# Patient Record
Sex: Female | Born: 1940 | Race: White | Hispanic: No | Marital: Married | Smoking: Never smoker
Health system: Southern US, Community
[De-identification: ages and names within clinical notes are randomized; demographics above are authoritative.]

## PROBLEM LIST (undated history)

## (undated) DIAGNOSIS — J449 Chronic obstructive pulmonary disease, unspecified: Secondary | ICD-10-CM

## (undated) DIAGNOSIS — E785 Hyperlipidemia, unspecified: Secondary | ICD-10-CM

## (undated) DIAGNOSIS — M1990 Unspecified osteoarthritis, unspecified site: Secondary | ICD-10-CM

## (undated) DIAGNOSIS — Z974 Presence of external hearing-aid: Secondary | ICD-10-CM

## (undated) DIAGNOSIS — J189 Pneumonia, unspecified organism: Secondary | ICD-10-CM

## (undated) DIAGNOSIS — J45909 Unspecified asthma, uncomplicated: Secondary | ICD-10-CM

## (undated) DIAGNOSIS — K219 Gastro-esophageal reflux disease without esophagitis: Secondary | ICD-10-CM

## (undated) DIAGNOSIS — R519 Headache, unspecified: Secondary | ICD-10-CM

## (undated) DIAGNOSIS — Z9289 Personal history of other medical treatment: Secondary | ICD-10-CM

## (undated) DIAGNOSIS — F0390 Unspecified dementia without behavioral disturbance: Secondary | ICD-10-CM

## (undated) DIAGNOSIS — R0600 Dyspnea, unspecified: Secondary | ICD-10-CM

## (undated) DIAGNOSIS — F909 Attention-deficit hyperactivity disorder, unspecified type: Secondary | ICD-10-CM

## (undated) DIAGNOSIS — K7460 Unspecified cirrhosis of liver: Secondary | ICD-10-CM

## (undated) HISTORY — PX: CHOLECYSTECTOMY: SHX55

## (undated) HISTORY — PX: COLONOSCOPY: SHX174

## (undated) HISTORY — PX: ABDOMINAL HYSTERECTOMY: SHX81

## (undated) HISTORY — PX: APPENDECTOMY: SHX54

---

## 2004-11-03 ENCOUNTER — Ambulatory Visit: Payer: Self-pay | Admitting: Internal Medicine

## 2005-11-06 ENCOUNTER — Ambulatory Visit: Payer: Self-pay | Admitting: Internal Medicine

## 2005-11-15 ENCOUNTER — Ambulatory Visit: Payer: Self-pay | Admitting: Internal Medicine

## 2006-10-04 ENCOUNTER — Ambulatory Visit: Payer: Self-pay | Admitting: Internal Medicine

## 2006-10-05 ENCOUNTER — Ambulatory Visit: Payer: Self-pay | Admitting: Internal Medicine

## 2006-10-31 ENCOUNTER — Ambulatory Visit: Payer: Self-pay | Admitting: Gynecologic Oncology

## 2006-11-19 ENCOUNTER — Ambulatory Visit: Payer: Self-pay | Admitting: Internal Medicine

## 2006-11-22 ENCOUNTER — Ambulatory Visit: Payer: Self-pay | Admitting: Unknown Physician Specialty

## 2007-02-25 ENCOUNTER — Emergency Department: Payer: Self-pay | Admitting: Emergency Medicine

## 2007-06-25 ENCOUNTER — Encounter: Payer: Self-pay | Admitting: Otolaryngology

## 2007-07-03 ENCOUNTER — Encounter: Payer: Self-pay | Admitting: Otolaryngology

## 2007-11-25 ENCOUNTER — Ambulatory Visit: Payer: Self-pay | Admitting: Family Medicine

## 2007-11-27 ENCOUNTER — Ambulatory Visit: Payer: Self-pay | Admitting: Family Medicine

## 2007-12-26 ENCOUNTER — Ambulatory Visit: Payer: Self-pay | Admitting: Gastroenterology

## 2008-04-15 ENCOUNTER — Ambulatory Visit: Payer: Self-pay | Admitting: Internal Medicine

## 2008-04-21 ENCOUNTER — Ambulatory Visit: Payer: Self-pay | Admitting: Internal Medicine

## 2008-11-26 ENCOUNTER — Ambulatory Visit: Payer: Self-pay | Admitting: Nurse Practitioner

## 2009-12-20 ENCOUNTER — Ambulatory Visit: Payer: Self-pay | Admitting: Nurse Practitioner

## 2010-02-09 ENCOUNTER — Ambulatory Visit: Payer: Self-pay | Admitting: Gastroenterology

## 2010-10-28 ENCOUNTER — Emergency Department: Payer: Self-pay | Admitting: Emergency Medicine

## 2011-01-05 ENCOUNTER — Ambulatory Visit: Payer: Self-pay | Admitting: Family Medicine

## 2012-04-03 DIAGNOSIS — E559 Vitamin D deficiency, unspecified: Secondary | ICD-10-CM | POA: Insufficient documentation

## 2012-04-26 ENCOUNTER — Ambulatory Visit: Payer: Self-pay | Admitting: Family Medicine

## 2013-02-03 DIAGNOSIS — G2581 Restless legs syndrome: Secondary | ICD-10-CM | POA: Insufficient documentation

## 2013-05-01 ENCOUNTER — Ambulatory Visit: Payer: Self-pay | Admitting: Family Medicine

## 2014-03-23 DIAGNOSIS — J479 Bronchiectasis, uncomplicated: Secondary | ICD-10-CM | POA: Insufficient documentation

## 2014-06-03 ENCOUNTER — Ambulatory Visit: Payer: Self-pay | Admitting: Family Medicine

## 2014-08-25 ENCOUNTER — Ambulatory Visit: Payer: Self-pay | Admitting: Family Medicine

## 2014-09-15 ENCOUNTER — Ambulatory Visit: Payer: Self-pay | Admitting: Urgent Care

## 2015-06-16 ENCOUNTER — Other Ambulatory Visit: Payer: Self-pay | Admitting: Family Medicine

## 2015-06-16 DIAGNOSIS — Z1231 Encounter for screening mammogram for malignant neoplasm of breast: Secondary | ICD-10-CM

## 2015-06-17 ENCOUNTER — Ambulatory Visit
Admission: RE | Admit: 2015-06-17 | Discharge: 2015-06-17 | Disposition: A | Discharge status: Home / Self Care | Payer: Medicare Other | Source: Ambulatory Visit | Attending: Family Medicine | Admitting: Family Medicine

## 2015-06-17 DIAGNOSIS — Z1231 Encounter for screening mammogram for malignant neoplasm of breast: Secondary | ICD-10-CM | POA: Diagnosis present

## 2015-08-16 ENCOUNTER — Emergency Department
Admission: EM | Admit: 2015-08-16 | Discharge: 2015-08-16 | Disposition: A | Discharge status: Home / Self Care | Payer: Medicare Other | Attending: Emergency Medicine | Admitting: Emergency Medicine

## 2015-08-16 ENCOUNTER — Encounter: Payer: Self-pay | Admitting: Emergency Medicine

## 2015-08-16 DIAGNOSIS — G2581 Restless legs syndrome: Secondary | ICD-10-CM | POA: Diagnosis not present

## 2015-08-16 DIAGNOSIS — M79604 Pain in right leg: Secondary | ICD-10-CM | POA: Diagnosis present

## 2015-08-16 DIAGNOSIS — Z880 Allergy status to penicillin: Secondary | ICD-10-CM | POA: Diagnosis not present

## 2015-08-16 DIAGNOSIS — Z79899 Other long term (current) drug therapy: Secondary | ICD-10-CM | POA: Insufficient documentation

## 2015-08-16 DIAGNOSIS — Z7951 Long term (current) use of inhaled steroids: Secondary | ICD-10-CM | POA: Diagnosis not present

## 2015-08-16 DIAGNOSIS — Z791 Long term (current) use of non-steroidal anti-inflammatories (NSAID): Secondary | ICD-10-CM | POA: Diagnosis not present

## 2015-08-16 HISTORY — DX: Hyperlipidemia, unspecified: E78.5

## 2015-08-16 HISTORY — DX: Chronic obstructive pulmonary disease, unspecified: J44.9

## 2015-08-16 HISTORY — DX: Gastro-esophageal reflux disease without esophagitis: K21.9

## 2015-08-16 MED ORDER — KETOROLAC TROMETHAMINE 60 MG/2ML IM SOLN
30.0000 mg | Freq: Once | INTRAMUSCULAR | Status: AC
  Administered 2015-08-16: 30 mg via INTRAMUSCULAR
  Filled 2015-08-16: qty 2

## 2015-08-16 MED ORDER — HYDROCODONE-ACETAMINOPHEN 5-325 MG PO TABS: 1.0000 | ORAL_TABLET | ORAL | Status: DC | PRN

## 2015-08-16 MED ORDER — OXYCODONE-ACETAMINOPHEN 5-325 MG PO TABS: 1.0000 | ORAL_TABLET | ORAL | Status: DC | PRN

## 2015-08-16 MED ORDER — BACLOFEN 10 MG PO TABS: 10.0000 mg | ORAL_TABLET | Freq: Three times a day (TID) | ORAL | Status: DC

## 2015-08-16 NOTE — Discharge Instructions (Signed)
Restless Legs Syndrome Restless legs syndrome is a condition that causes uncomfortable feelings or sensations in the legs, especially while sitting or lying down. The sensations usually cause an overwhelming urge to move the legs. The arms can also sometimes be affected. The condition can range from mild to severe. The symptoms often interfere with a person's ability to sleep. CAUSES The cause of this condition is not known. RISK FACTORS This condition is more likely to develop in:  People who are older than age 50.  Pregnant women. In general, restless legs syndrome is more common in women than in men.  People who have a family history of the condition.  People who have certain medical conditions, such as iron deficiency, kidney disease, Parkinson disease, or nerve damage.  People who take certain medicines, such as medicines for high blood pressure, nausea, colds, allergies, depression, and some heart conditions. SYMPTOMS The main symptom of this condition is uncomfortable sensations in the legs. These sensations may be:  Described as pulling, tingling, prickling, throbbing, crawling, or burning.  Worse while you are sitting or lying down.  Worse during periods of rest or inactivity.  Worse at night, often interfering with your sleep.  Accompanied by a very strong urge to move your legs.  Temporarily relieved by movement of your legs. The sensations usually affect both sides of the body. The arms can also be affected, but this is rare. People who have this condition often have tiredness during the day because of their lack of sleep at night. DIAGNOSIS This condition may be diagnosed based on your description of the symptoms. You may also have tests, including blood tests, to check for other conditions that may lead to your symptoms. In some cases, you may be asked to spend some time in a sleep lab so your sleeping can be monitored. TREATMENT Treatment for this condition is  focused on managing the symptoms. Treatment may include:  Self-help and lifestyle changes.  Medicines. HOME CARE INSTRUCTIONS  Take medicines only as directed by your health care provider.  Try these methods to get temporary relief from the uncomfortable sensations:  Massage your legs.  Walk or stretch.  Take a cold or hot bath.  Practice good sleep habits. For example, go to bed and get up at the same time every day.  Exercise regularly.  Practice ways of relaxing, such as yoga or meditation.  Avoid caffeine and alcohol.  Do not use any tobacco products, including cigarettes, chewing tobacco, or electronic cigarettes. If you need help quitting, ask your health care provider.  Keep all follow-up visits as directed by your health care provider. This is important. SEEK MEDICAL CARE IF: Your symptoms do not improve with treatment, or they get worse.   This information is not intended to replace advice given to you by your health care provider. Make sure you discuss any questions you have with your health care provider.   Document Released: 09/08/2002 Document Revised: 02/02/2015 Document Reviewed: 09/14/2014 Elsevier Interactive Patient Education 2016 Elsevier Inc.  

## 2015-08-16 NOTE — ED Provider Notes (Signed)
[facility] Emergency Department Provider Note  ____________________________________________  Time seen: Approximately 10:18 AM  I have reviewed the triage vital signs and the nursing notes.   HISTORY  Chief Complaint Leg Pain    HPI Abigail Powers is a 74 y.o. female is for evaluation of right leg pain 2 weeks. Patient states that she is under the care of Dr. for degenerative disc disease and has received a shot but still complain of pain going down her entire right leg. States the spasms at nighttime unable to sleep   Past Medical History  Diagnosis Date  . COPD (chronic obstructive pulmonary disease) ([place])   . GERD (gastroesophageal reflux disease)   . Hyperlipidemia     There are no active problems to display for this patient.   History reviewed. No pertinent past surgical history.  Current Outpatient Rx  Name  Route  Sig  Dispense  Refill  . citalopram (CELEXA) 10 MG tablet   Oral   Take 10 mg by mouth daily.         . diclofenac (VOLTAREN) 50 MG EC tablet   Oral   Take 50 mg by mouth 2 (two) times daily.         . fluticasone (FLONASE) 50 MCG/ACT nasal spray   Each Nare   Place 2 sprays into both nostrils daily.         Marland Kitchen lovastatin (MEVACOR) 10 MG tablet   Oral   Take 10 mg by mouth at bedtime.         . pantoprazole (PROTONIX) 40 MG tablet   Oral   Take 40 mg by mouth daily.         . traZODone (DESYREL) 50 MG tablet   Oral   Take 50 mg by mouth at bedtime.         . baclofen (LIORESAL) 10 MG tablet   Oral   Take 1 tablet (10 mg total) by mouth 3 (three) times daily.   60 tablet   0   . oxyCODONE-acetaminophen (ROXICET) 5-325 MG tablet   Oral   Take 1-2 tablets by mouth every 4 (four) hours as needed for severe pain.   15 tablet   0     Allergies Penicillins  History reviewed. No pertinent family history.  Social History Social History  Substance Use Topics  . Smoking status: Never  Smoker   . Smokeless tobacco: None  . Alcohol Use: No    Review of Systems Constitutional: No fever/chills Eyes: No visual changes. ENT: No sore throat. Cardiovascular: Denies chest pain. Respiratory: Denies shortness of breath. Gastrointestinal: No abdominal pain.  No nausea, no vomiting.  No diarrhea.  No constipation. Genitourinary: Negative for dysuria. Musculoskeletal: Negative for back pain. Positive for right leg pain Skin: Negative for rash. Neurological: Negative for headaches, focal weakness or numbness.  10-point ROS otherwise negative.  ____________________________________________   PHYSICAL EXAM:  VITAL SIGNS: ED Triage Vitals  Enc Vitals Group     BP 08/16/15 0947 134/69 mmHg     Pulse Rate 08/16/15 0947 71     Resp 08/16/15 0947 20     Temp 08/16/15 0947 98.8 F (37.1 C)     Temp Source 08/16/15 0947 Oral     SpO2 08/16/15 0947 95 %     Weight 08/16/15 0947 129 lb (58.514 kg)     Height 08/16/15 0947 5\' 4"  (1.626 m)     Head Cir --  Peak Flow --      Pain Score 08/16/15 0939 10     Pain Loc --      Pain Edu? --      Excl. in [place]? --     Constitutional: Alert and oriented. Well appearing and in no acute distress. Eyes: Conjunctivae are normal. PERRL. EOMI. Head: Atraumatic. Nose: No congestion/rhinnorhea. Mouth/Throat: Mucous membranes are moist.  Oropharynx non-erythematous. Neck: No stridor.  Cervical spinal tenderness to palpation. Cardiovascular: Normal rate, regular rhythm. Grossly normal heart sounds.  Good peripheral circulation. Respiratory: Normal respiratory effort.  No retractions. Lungs CTAB. Musculoskeletal: No lower extremity tenderness nor edema.  No joint effusions. Straight leg raise unremarkable, pelvic rock unremarkable. Distal neurovascularly intact. Neurologic:  Normal speech and language. No gross focal neurologic deficits are appreciated. No gait instability. Skin:  Skin is warm, dry and intact. No rash noted. Psychiatric:  Mood and affect are normal. Speech and behavior are normal.  ____________________________________________   LABS (all labs ordered are listed, but only abnormal results are displayed)  Labs Reviewed - No data to display ____________________________________________    PROCEDURES  Procedure(s) performed: None  Critical Care performed: No  ____________________________________________   INITIAL IMPRESSION / ASSESSMENT AND PLAN / ED COURSE  Pertinent labs & imaging results that were available during my care of the patient were reviewed by me and considered in my medical decision making (see chart for details).  Recurrent leg pain. Consider restless leg syndrome. Rx given for baclofen 10 mg 3 times a day, Percocet 5/325. Patient to continue her diclofenac sodium follow up with PCP next week as needed. She voices no other emergency medical complaints at this time. ____________________________________________   FINAL CLINICAL IMPRESSION(S) / ED DIAGNOSES  Final diagnoses:  Restless legs syndrome      Arlyss Repress, PA-C 08/16/15 1026  Lavonia Drafts, MD 08/16/15 1355

## 2015-08-16 NOTE — ED Notes (Addendum)
Pt to ed with c/o right leg pain x 2 weeks,  Pt states she was seen by PMD last week for same.  Pt reports she has DDD.  Denies injury.

## 2015-08-18 ENCOUNTER — Other Ambulatory Visit: Payer: Self-pay | Admitting: Rheumatology

## 2015-08-18 DIAGNOSIS — M545 Low back pain, unspecified: Secondary | ICD-10-CM

## 2015-08-18 DIAGNOSIS — M79605 Pain in left leg: Secondary | ICD-10-CM

## 2015-08-18 DIAGNOSIS — G8929 Other chronic pain: Secondary | ICD-10-CM

## 2015-08-18 DIAGNOSIS — M79604 Pain in right leg: Secondary | ICD-10-CM

## 2015-08-19 ENCOUNTER — Other Ambulatory Visit: Payer: Self-pay | Admitting: Rheumatology

## 2015-08-19 DIAGNOSIS — G8929 Other chronic pain: Secondary | ICD-10-CM

## 2015-08-19 DIAGNOSIS — M79604 Pain in right leg: Secondary | ICD-10-CM

## 2015-08-19 DIAGNOSIS — M545 Low back pain: Principal | ICD-10-CM

## 2015-08-19 DIAGNOSIS — M79605 Pain in left leg: Secondary | ICD-10-CM

## 2015-08-20 ENCOUNTER — Ambulatory Visit
Admission: RE | Admit: 2015-08-20 | Discharge: 2015-08-20 | Disposition: A | Discharge status: Home / Self Care | Payer: Medicare Other | Source: Ambulatory Visit | Attending: Rheumatology | Admitting: Rheumatology

## 2015-08-20 ENCOUNTER — Other Ambulatory Visit: Payer: Medicare Other

## 2015-08-20 DIAGNOSIS — G8929 Other chronic pain: Secondary | ICD-10-CM

## 2015-08-20 DIAGNOSIS — M79605 Pain in left leg: Secondary | ICD-10-CM

## 2015-08-20 DIAGNOSIS — M545 Low back pain: Principal | ICD-10-CM

## 2015-08-20 DIAGNOSIS — M79604 Pain in right leg: Secondary | ICD-10-CM

## 2015-08-25 ENCOUNTER — Other Ambulatory Visit: Payer: Self-pay | Admitting: Rheumatology

## 2015-08-25 DIAGNOSIS — M545 Low back pain: Principal | ICD-10-CM

## 2015-08-25 DIAGNOSIS — G8929 Other chronic pain: Secondary | ICD-10-CM

## 2015-08-30 ENCOUNTER — Ambulatory Visit
Admission: RE | Admit: 2015-08-30 | Discharge: 2015-08-30 | Disposition: A | Discharge status: Home / Self Care | Payer: Medicare Other | Source: Ambulatory Visit | Attending: Rheumatology | Admitting: Rheumatology

## 2015-08-30 ENCOUNTER — Other Ambulatory Visit: Payer: Self-pay | Admitting: Rheumatology

## 2015-08-30 DIAGNOSIS — M26609 Unspecified temporomandibular joint disorder, unspecified side: Secondary | ICD-10-CM | POA: Insufficient documentation

## 2015-08-30 DIAGNOSIS — E785 Hyperlipidemia, unspecified: Secondary | ICD-10-CM | POA: Insufficient documentation

## 2015-08-30 DIAGNOSIS — R109 Unspecified abdominal pain: Secondary | ICD-10-CM | POA: Insufficient documentation

## 2015-08-30 DIAGNOSIS — R739 Hyperglycemia, unspecified: Secondary | ICD-10-CM | POA: Insufficient documentation

## 2015-08-30 DIAGNOSIS — F32A Depression, unspecified: Secondary | ICD-10-CM | POA: Insufficient documentation

## 2015-08-30 DIAGNOSIS — G8929 Other chronic pain: Secondary | ICD-10-CM

## 2015-08-30 DIAGNOSIS — K219 Gastro-esophageal reflux disease without esophagitis: Secondary | ICD-10-CM | POA: Insufficient documentation

## 2015-08-30 DIAGNOSIS — G4700 Insomnia, unspecified: Secondary | ICD-10-CM | POA: Insufficient documentation

## 2015-08-30 DIAGNOSIS — M545 Low back pain: Principal | ICD-10-CM

## 2015-08-30 DIAGNOSIS — G43909 Migraine, unspecified, not intractable, without status migrainosus: Secondary | ICD-10-CM | POA: Insufficient documentation

## 2015-08-30 DIAGNOSIS — F419 Anxiety disorder, unspecified: Secondary | ICD-10-CM | POA: Insufficient documentation

## 2015-08-30 DIAGNOSIS — F329 Major depressive disorder, single episode, unspecified: Secondary | ICD-10-CM | POA: Insufficient documentation

## 2015-08-30 DIAGNOSIS — M810 Age-related osteoporosis without current pathological fracture: Secondary | ICD-10-CM | POA: Insufficient documentation

## 2015-08-30 DIAGNOSIS — M1990 Unspecified osteoarthritis, unspecified site: Secondary | ICD-10-CM | POA: Insufficient documentation

## 2015-08-30 DIAGNOSIS — J309 Allergic rhinitis, unspecified: Secondary | ICD-10-CM | POA: Insufficient documentation

## 2015-08-30 MED ORDER — IOHEXOL 180 MG/ML  SOLN
1.0000 mL | Freq: Once | INTRAMUSCULAR | Status: AC | PRN
Start: 2015-08-30 — End: 2015-08-30
  Administered 2015-08-30: 1 mL via EPIDURAL

## 2015-08-30 MED ORDER — METHYLPREDNISOLONE ACETATE 40 MG/ML INJ SUSP (RADIOLOG
120.0000 mg | Freq: Once | INTRAMUSCULAR | Status: AC
  Administered 2015-08-30: 120 mg via EPIDURAL

## 2015-08-30 NOTE — Discharge Instructions (Signed)

## 2015-09-06 ENCOUNTER — Ambulatory Visit: Payer: Medicare Other

## 2015-09-09 ENCOUNTER — Other Ambulatory Visit: Payer: Self-pay | Admitting: Rheumatology

## 2015-09-09 DIAGNOSIS — M545 Low back pain, unspecified: Secondary | ICD-10-CM

## 2015-09-09 DIAGNOSIS — G8929 Other chronic pain: Secondary | ICD-10-CM

## 2015-09-17 ENCOUNTER — Ambulatory Visit
Admission: RE | Admit: 2015-09-17 | Discharge: 2015-09-17 | Disposition: A | Discharge status: Home / Self Care | Payer: Medicare Other | Source: Ambulatory Visit | Attending: Rheumatology | Admitting: Rheumatology

## 2015-09-17 DIAGNOSIS — M545 Low back pain, unspecified: Secondary | ICD-10-CM

## 2015-09-17 DIAGNOSIS — G8929 Other chronic pain: Secondary | ICD-10-CM

## 2015-09-17 MED ORDER — IOHEXOL 180 MG/ML  SOLN
1.0000 mL | Freq: Once | INTRAMUSCULAR | Status: AC | PRN
  Administered 2015-09-17: 1 mL via EPIDURAL

## 2015-09-17 MED ORDER — METHYLPREDNISOLONE ACETATE 40 MG/ML INJ SUSP (RADIOLOG
120.0000 mg | Freq: Once | INTRAMUSCULAR | Status: AC
  Administered 2015-09-17: 120 mg via EPIDURAL

## 2015-12-06 DIAGNOSIS — J449 Chronic obstructive pulmonary disease, unspecified: Secondary | ICD-10-CM | POA: Insufficient documentation

## 2015-12-31 DIAGNOSIS — M67442 Ganglion, left hand: Secondary | ICD-10-CM | POA: Insufficient documentation

## 2016-02-14 ENCOUNTER — Other Ambulatory Visit: Payer: Self-pay | Admitting: Family Medicine

## 2016-02-14 DIAGNOSIS — R590 Localized enlarged lymph nodes: Secondary | ICD-10-CM

## 2016-02-17 ENCOUNTER — Ambulatory Visit: Payer: Medicare Other

## 2016-02-22 ENCOUNTER — Ambulatory Visit: Payer: Medicare Other

## 2016-02-22 ENCOUNTER — Ambulatory Visit
Admission: RE | Admit: 2016-02-22 | Discharge: 2016-02-22 | Disposition: A | Discharge status: Home / Self Care | Payer: Medicare Other | Source: Ambulatory Visit | Attending: Family Medicine | Admitting: Family Medicine

## 2016-02-22 DIAGNOSIS — R590 Localized enlarged lymph nodes: Secondary | ICD-10-CM

## 2016-02-22 DIAGNOSIS — R5383 Other fatigue: Secondary | ICD-10-CM | POA: Insufficient documentation

## 2016-02-22 DIAGNOSIS — R5381 Other malaise: Secondary | ICD-10-CM | POA: Insufficient documentation

## 2016-02-29 ENCOUNTER — Ambulatory Visit: Payer: Medicare Other

## 2016-06-13 ENCOUNTER — Other Ambulatory Visit: Payer: Self-pay | Admitting: Family Medicine

## 2016-06-13 DIAGNOSIS — R1084 Generalized abdominal pain: Secondary | ICD-10-CM

## 2016-06-15 ENCOUNTER — Other Ambulatory Visit: Payer: Self-pay | Admitting: Family Medicine

## 2016-06-15 ENCOUNTER — Ambulatory Visit
Admission: RE | Admit: 2016-06-15 | Discharge: 2016-06-15 | Disposition: A | Discharge status: Home / Self Care | Payer: Medicare Other | Source: Ambulatory Visit | Attending: Family Medicine | Admitting: Family Medicine

## 2016-06-15 ENCOUNTER — Ambulatory Visit: Admission: RE | Admit: 2016-06-15 | Payer: Medicare Other | Source: Ambulatory Visit

## 2016-06-15 DIAGNOSIS — I700 Atherosclerosis of aorta: Secondary | ICD-10-CM | POA: Diagnosis not present

## 2016-06-15 DIAGNOSIS — R1084 Generalized abdominal pain: Secondary | ICD-10-CM | POA: Diagnosis present

## 2016-06-15 DIAGNOSIS — R932 Abnormal findings on diagnostic imaging of liver and biliary tract: Secondary | ICD-10-CM | POA: Diagnosis not present

## 2016-06-15 DIAGNOSIS — Z1231 Encounter for screening mammogram for malignant neoplasm of breast: Secondary | ICD-10-CM

## 2016-06-15 DIAGNOSIS — Z9049 Acquired absence of other specified parts of digestive tract: Secondary | ICD-10-CM | POA: Insufficient documentation

## 2016-06-15 DIAGNOSIS — Z90710 Acquired absence of both cervix and uterus: Secondary | ICD-10-CM | POA: Insufficient documentation

## 2016-06-15 HISTORY — DX: Unspecified asthma, uncomplicated: J45.909

## 2016-06-15 MED ORDER — IOPAMIDOL (ISOVUE-300) INJECTION 61%
100.0000 mL | Freq: Once | INTRAVENOUS | Status: AC | PRN
  Administered 2016-06-15: 100 mL via INTRAVENOUS

## 2016-06-20 ENCOUNTER — Ambulatory Visit: Admission: RE | Admit: 2016-06-20 | Payer: Medicare Other | Source: Ambulatory Visit

## 2016-06-22 ENCOUNTER — Ambulatory Visit
Admission: RE | Admit: 2016-06-22 | Discharge: 2016-06-22 | Disposition: A | Discharge status: Home / Self Care | Payer: Medicare Other | Source: Ambulatory Visit | Attending: Family Medicine | Admitting: Family Medicine

## 2016-06-22 DIAGNOSIS — R928 Other abnormal and inconclusive findings on diagnostic imaging of breast: Secondary | ICD-10-CM | POA: Insufficient documentation

## 2016-06-22 DIAGNOSIS — Z1231 Encounter for screening mammogram for malignant neoplasm of breast: Secondary | ICD-10-CM | POA: Insufficient documentation

## 2016-06-27 ENCOUNTER — Other Ambulatory Visit: Payer: Self-pay | Admitting: Family Medicine

## 2016-06-27 DIAGNOSIS — N6489 Other specified disorders of breast: Secondary | ICD-10-CM

## 2016-07-07 ENCOUNTER — Ambulatory Visit: Payer: Medicare Other

## 2016-07-07 ENCOUNTER — Other Ambulatory Visit: Payer: Medicare Other

## 2016-07-11 ENCOUNTER — Ambulatory Visit
Admission: RE | Admit: 2016-07-11 | Discharge: 2016-07-11 | Disposition: A | Discharge status: Home / Self Care | Payer: Medicare Other | Source: Ambulatory Visit | Attending: Family Medicine | Admitting: Family Medicine

## 2016-07-11 DIAGNOSIS — N6489 Other specified disorders of breast: Secondary | ICD-10-CM

## 2016-07-11 DIAGNOSIS — N63 Unspecified lump in unspecified breast: Secondary | ICD-10-CM | POA: Diagnosis not present

## 2016-07-20 ENCOUNTER — Telehealth: Payer: Self-pay

## 2016-07-20 NOTE — Telephone Encounter (Signed)
Patient stated that she needed an upper endoscopy done and wants it with doctor wohl. Her doctor wanted her to get one because of something with her liver. I told her to give [place] a call back as soon as she finds out what why exactly it needs to be done. Triage is complete, she is perfectly fit to go to [facility] or [facility]

## 2016-07-20 NOTE — Telephone Encounter (Signed)
Gastroenterology Pre-Procedure Review  Request Date:  Requesting Physician:   PATIENT REVIEW QUESTIONS: The patient responded to the following health history questions as indicated:    1. Are you having any GI issues? yes (abdominal pain) 2. Do you have a personal history of Polyps? no 3. Do you have a family history of Colon Cancer or Polyps? no 4. Diabetes Mellitus? no 5. Joint replacements in the past 12 months?no 6. Major health problems in the past 3 months?no 7. Any artificial heart valves, MVP, or defibrillator?no    MEDICATIONS & ALLERGIES:    Patient reports the following regarding taking any anticoagulation/antiplatelet therapy:   Plavix, Coumadin, Eliquis, Xarelto, Lovenox, Pradaxa, Brilinta, or Effient? no Aspirin? no  Patient confirms/reports the following medications:  Current Outpatient Prescriptions  Medication Sig Dispense Refill  . Calcium Carb-Ergocalciferol 250-125 MG-UNIT TABS Take by mouth.    . citalopram (CELEXA) 10 MG tablet Take 10 mg by mouth daily.    . diclofenac (VOLTAREN) 50 MG EC tablet Take 50 mg by mouth 2 (two) times daily.    . fluticasone (FLONASE) 50 MCG/ACT nasal spray Place 2 sprays into both nostrils daily.    Marland Kitchen HYDROcodone-acetaminophen (NORCO) 5-325 MG tablet Take 1-2 tablets by mouth every 4 (four) hours as needed for moderate pain. 15 tablet 0  . lovastatin (MEVACOR) 10 MG tablet Take 10 mg by mouth at bedtime.    . pantoprazole (PROTONIX) 40 MG tablet Take 40 mg by mouth daily.    . polyethylene glycol powder (MIRALAX) powder Take 1 Container by mouth once.    . SYMBICORT 160-4.5 MCG/ACT inhaler     . traZODone (DESYREL) 50 MG tablet Take 50 mg by mouth at bedtime.    . vitamin B-12 (CYANOCOBALAMIN) 1000 MCG tablet Take by mouth.    Marland Kitchen albuterol (PROAIR HFA) 108 (90 BASE) MCG/ACT inhaler Inhale into the lungs.    . clonazePAM (KLONOPIN) 0.5 MG tablet Take by mouth.    Marland Kitchen ipratropium-albuterol (DUONEB) 0.5-2.5 (3) MG/3ML SOLN Inhale into  the lungs.     No current facility-administered medications for this visit.     Patient confirms/reports the following allergies:  Allergies  Allergen Reactions  . Penicillins Rash    No orders of the defined types were placed in this encounter.   AUTHORIZATION INFORMATION Primary Insurance: 1D#: Group #:  Secondary Insurance: 1D#: Group #:  SCHEDULE INFORMATION: Date:  Time: Location:

## 2016-08-23 ENCOUNTER — Encounter: Payer: Self-pay | Admitting: Gastroenterology

## 2016-08-23 ENCOUNTER — Ambulatory Visit (INDEPENDENT_AMBULATORY_CARE_PROVIDER_SITE_OTHER): Payer: Medicare Other | Admitting: Gastroenterology

## 2016-08-23 ENCOUNTER — Other Ambulatory Visit: Payer: Self-pay

## 2016-08-23 ENCOUNTER — Other Ambulatory Visit
Admission: RE | Admit: 2016-08-23 | Discharge: 2016-08-23 | Disposition: A | Discharge status: Home / Self Care | Payer: Medicare Other | Source: Ambulatory Visit | Attending: Gastroenterology | Admitting: Gastroenterology

## 2016-08-23 VITALS — BP 130/53 | HR 80 | Temp 98.4°F | Ht 64.0 in | Wt 119.0 lb

## 2016-08-23 DIAGNOSIS — R935 Abnormal findings on diagnostic imaging of other abdominal regions, including retroperitoneum: Secondary | ICD-10-CM

## 2016-08-23 DIAGNOSIS — R634 Abnormal weight loss: Secondary | ICD-10-CM | POA: Diagnosis not present

## 2016-08-23 DIAGNOSIS — K5900 Constipation, unspecified: Secondary | ICD-10-CM

## 2016-08-23 DIAGNOSIS — R1010 Upper abdominal pain, unspecified: Secondary | ICD-10-CM

## 2016-08-23 LAB — CBC WITH DIFFERENTIAL/PLATELET
BASOS ABS: 0 10*3/uL (ref 0–0.1)
Basophils Relative: 1 %
Eosinophils Absolute: 0.1 10*3/uL (ref 0–0.7)
Eosinophils Relative: 2 %
HEMATOCRIT: 37.1 % (ref 35.0–47.0)
Hemoglobin: 13.1 g/dL (ref 12.0–16.0)
LYMPHS ABS: 1.2 10*3/uL (ref 1.0–3.6)
LYMPHS PCT: 23 %
MCH: 33.1 pg (ref 26.0–34.0)
MCHC: 35.2 g/dL (ref 32.0–36.0)
MCV: 94 fL (ref 80.0–100.0)
MONO ABS: 0.5 10*3/uL (ref 0.2–0.9)
MONOS PCT: 8 %
NEUTROS ABS: 3.6 10*3/uL (ref 1.4–6.5)
Neutrophils Relative %: 66 %
Platelets: 253 10*3/uL (ref 150–440)
RBC: 3.95 MIL/uL (ref 3.80–5.20)
RDW: 14 % (ref 11.5–14.5)
WBC: 5.4 10*3/uL (ref 3.6–11.0)

## 2016-08-23 LAB — IRON AND TIBC
IRON: 85 ug/dL (ref 28–170)
Saturation Ratios: 26 % (ref 10.4–31.8)
TIBC: 331 ug/dL (ref 250–450)
UIBC: 246 ug/dL

## 2016-08-23 LAB — FERRITIN: FERRITIN: 26 ng/mL (ref 11–307)

## 2016-08-23 NOTE — Patient Instructions (Signed)
High-Fiber Diet Fiber, also called dietary fiber, is a type of carbohydrate found in fruits, vegetables, whole grains, and beans. A high-fiber diet can have many health benefits. Your health care provider may recommend a high-fiber diet to help:  Prevent constipation. Fiber can make your bowel movements more regular.  Lower your cholesterol.  Relieve hemorrhoids, uncomplicated diverticulosis, or irritable bowel syndrome.  Prevent overeating as part of a weight-loss plan.  Prevent heart disease, type 2 diabetes, and certain cancers. What is my plan? The recommended daily intake of fiber includes:  38 grams for men under age 66.  42 grams for men over age 84.  36 grams for women under age 46.  15 grams for women over age 15. You can get the recommended daily intake of dietary fiber by eating a variety of fruits, vegetables, grains, and beans. Your health care provider may also recommend a fiber supplement if it is not possible to get enough fiber through your diet. What do I need to know about a high-fiber diet?  Fiber supplements have not been widely studied for their effectiveness, so it is better to get fiber through food sources.  Always check the fiber content on thenutrition facts label of any prepackaged food. Look for foods that contain at least 5 grams of fiber per serving.  Ask your dietitian if you have questions about specific foods that are related to your condition, especially if those foods are not listed in the following section.  Increase your daily fiber consumption gradually. Increasing your intake of dietary fiber too quickly may cause bloating, cramping, or gas.  Drink plenty of water. Water helps you to digest fiber. What foods can I eat? Grains  Whole-grain breads. Multigrain cereal. Oats and oatmeal. Brown [place]. Barley. Bulgur wheat. [place]. Bran muffins. Popcorn. Rye wafer crackers. Vegetables  Sweet potatoes. Spinach. Kale. Artichokes. Cabbage. Broccoli.  Green peas. Carrots. Squash. Fruits  Berries. Pears. Apples. Oranges. Avocados. Prunes and raisins. Dried figs. Meats and Other Protein Sources  [place], kidney, pinto, and soy beans. Split peas. Lentils. Nuts and seeds. Dairy  Fiber-fortified yogurt. Beverages  Fiber-fortified soy milk. Fiber-fortified orange juice. Other  Fiber bars. The items listed above may not be a complete list of recommended foods or beverages. Contact your dietitian for more options.  What foods are not recommended? Grains  White bread. Pasta made with refined flour. White [place]. Vegetables  Fried potatoes. Canned vegetables. Well-cooked vegetables. Fruits  Fruit juice. Cooked, strained fruit. Meats and Other Protein Sources  Fatty cuts of meat. Fried Sales executive or fried fish. Dairy  Milk. Yogurt. Cream cheese. Sour cream. Beverages  Soft drinks. Other  Cakes and pastries. Butter and oils. The items listed above may not be a complete list of foods and beverages to avoid. Contact your dietitian for more information.  What are some tips for including high-fiber foods in my diet?  Eat a wide variety of high-fiber foods.  Make sure that half of all grains consumed each day are whole grains.  Replace breads and cereals made from refined flour or white flour with whole-grain breads and cereals.  Replace white [place] with brown [place], bulgur wheat, or millet.  Start the day with a breakfast that is high in fiber, such as a cereal that contains at least 5 grams of fiber per serving.  Use beans in place of meat in soups, salads, or pasta.  Eat high-fiber snacks, such as berries, raw vegetables, nuts, or popcorn. This information is not intended to replace  questions you have with your health care provider. Document Released: 09/18/2005 Document Revised: 02/24/2016 Document Reviewed: 03/03/2014 Elsevier Interactive Patient Education  2017  Elsevier Inc. Constipation, Adult Constipation is when a person has fewer bowel movements in a week than normal, has difficulty having a bowel movement, or has stools that are dry, hard, or larger than normal. Constipation may be caused by an underlying condition. It may become worse with age if a person takes certain medicines and does not take in enough fluids. Follow these instructions at home: Eating and drinking   Eat foods that have a lot of fiber, such as fresh fruits and vegetables, whole grains, and beans.  Limit foods that are high in fat, low in fiber, or overly processed, such as french fries, hamburgers, cookies, candies, and soda.  Drink enough fluid to keep your urine clear or pale yellow. General instructions  Exercise regularly or as told by your health care provider.  Go to the restroom when you have the urge to go. Do not hold it in.  Take over-the-counter and prescription medicines only as told by your health care provider. These include any fiber supplements.  Practice pelvic floor retraining exercises, such as deep breathing while relaxing the lower abdomen and pelvic floor relaxation during bowel movements.  Watch your condition for any changes.  Keep all follow-up visits as told by your health care provider. This is important. Contact a health care provider if:  You have pain that gets worse.  You have a fever.  You do not have a bowel movement after 4 days.  You vomit.  You are not hungry.  You lose weight.  You are bleeding from the anus.  You have thin, pencil-like stools. Get help right away if:  You have a fever and your symptoms suddenly get worse.  You leak stool or have blood in your stool.  Your abdomen is bloated.  You have severe pain in your abdomen.  You feel dizzy or you faint. This information is not intended to replace advice given to you by your health care provider. Make sure you discuss any questions you have with your  health care provider. Document Released: 06/16/2004 Document Revised: 04/07/2016 Document Reviewed: 03/08/2016 Elsevier Interactive Patient Education  2017 Elsevier Inc.  

## 2016-08-23 NOTE — Progress Notes (Signed)
Gastroenterology Consultation  Referring Provider:     Gayland Curry, MD Primary Care Physician:  Gayland Curry, MD Primary Gastroenterologist:  Dr. Jonathon Bellows  Reason for Consultation:     Abnormal CT scan of the abdomen         HPI:   Abigail Powers is a 75 y.o. y/o female referred for consultation & management  by Dr. Gayland Curry, MD.   She has been referred here for an abnormal CT scan of the abdomen which was performed on 06/15/2016 for abdominal pain which revealed changes of the liver suggestive of cirrhosis and moderate stool burden in the colon.   A CT scan in 08/2014 did not reveal any abnormalities of her liver.   LFT's in 01/2016, 11/2014 were normal particularly with albumin.   I do not have any PT/INR or a platelet count available.   She says that she had the CT for the abdominal pain.   Abdominal pain: Onset: ongoing for years, since the 1960's.  She used to be on Chlordiazepoxide which she says worked. She said that in the past she was anxious. Occur once a week , each episode lasted for a few hours Site :points to the upper part of her abdomen  Radiation: no  Nature of pain: squeezing Aggravating factors: working and at times when she eats Relieving factors :resting Weight loss: Last 6 months says she has lost 10 lbs .  NSAID use: at times uses hydrocodone for shoulders and arms  PPI use :protonix for some years , does not help with the pain.  Gall bladder surgery: removed Frequency of bowel movements:  Used to have one every 2-3 days and had to strain very hard. Started using magnesium citrate , once daily , has a good bowel movement with , uses miralax as well daily. No change in abdominal pain  Relief with bowel movements: she does feel better after one  Gas/Bloating/Abdominal distension: some not much  .  Denies any alcohol intake, no tatoos either, no liver disease in the family. Last colonoscopy was 6 years back , she cant recall if she  had any polyps.      Past Medical History:  Diagnosis Date  . Asthma   . COPD (chronic obstructive pulmonary disease) ([place])   . GERD (gastroesophageal reflux disease)   . Hyperlipidemia     Past Surgical History:  Procedure Laterality Date  . ABDOMINAL HYSTERECTOMY      Prior to Admission medications   Medication Sig Start Date End Date Taking? Authorizing Provider  Calcium Carb-Ergocalciferol 250-125 MG-UNIT TABS Take by mouth.    Historical Provider, MD  citalopram (CELEXA) 10 MG tablet Take 10 mg by mouth daily.    Historical Provider, MD  clonazePAM (KLONOPIN) 0.5 MG tablet Take by mouth. 05/06/13   Historical Provider, MD  diclofenac (VOLTAREN) 50 MG EC tablet Take 50 mg by mouth 2 (two) times daily.    Historical Provider, MD  fluticasone (FLONASE) 50 MCG/ACT nasal spray Place 2 sprays into both nostrils daily.    Historical Provider, MD  HYDROcodone-acetaminophen (NORCO) 5-325 MG tablet Take 1-2 tablets by mouth every 4 (four) hours as needed for moderate pain. 08/16/15   Pierce Crane Beers, PA-C  ipratropium-albuterol (DUONEB) 0.5-2.5 (3) MG/3ML SOLN Inhale into the lungs. 12/16/14 12/11/15  Historical Provider, MD  lovastatin (MEVACOR) 10 MG tablet Take 10 mg by mouth at bedtime.    Historical Provider, MD  pantoprazole (PROTONIX) 40 MG tablet Take 40 mg by mouth daily.  Historical Provider, MD  polyethylene glycol powder (MIRALAX) powder Take 1 Container by mouth once.    Historical Provider, MD  SYMBICORT 160-4.5 MCG/ACT inhaler  07/13/15   Historical Provider, MD  traZODone (DESYREL) 50 MG tablet Take 50 mg by mouth at bedtime.    Historical Provider, MD  vitamin B-12 (CYANOCOBALAMIN) 1000 MCG tablet Take by mouth.    Historical Provider, MD    Family History  Problem Relation Age of Onset  . Breast cancer Neg Hx      Social History  Substance Use Topics  . Smoking status: Never Smoker  . Smokeless tobacco: Never Used  . Alcohol use No    Allergies as of  08/23/2016 - Review Complete 08/23/2016  Allergen Reaction Noted  . Penicillins Rash 08/16/2015    Review of Systems:    All systems reviewed and negative except where noted in HPI.   Physical Exam:  BP (!) 130/53   Pulse 80   Temp 98.4 F (36.9 C) (Oral)   Ht 5\' 4"  (1.626 m)   Wt 119 lb (54 kg)   BMI 20.43 kg/m  No LMP recorded. Patient has had a hysterectomy. Psych:  Alert and cooperative. Normal mood and affect. General:   Alert,  Well-developed, well-nourished, pleasant and cooperative in NAD Head:  Normocephalic and atraumatic. Eyes:  Sclera clear, no icterus.   Conjunctiva pink. Ears:  Normal auditory acuity. Mouth:  No deformity or lesions,oropharynx pink & moist. Neck:  Supple; no masses or thyromegaly. Lungs:  Respirations even and unlabored.  Clear throughout to auscultation.   No wheezes, crackles, or rhonchi. No acute distress. Heart:  Regular rate and rhythm; no murmurs, clicks, rubs, or gallops. Abdomen:  Normal bowel sounds.  No bruits.  Soft, non-tender and non-distended without masses, hepatosplenomegaly or hernias noted.  No guarding or rebound tenderness.    Msk:  Symmetrical without gross deformities. Good, equal movement & strength bilaterally. Pulses:  Normal pulses noted. Extremities:  No clubbing or edema.  No cyanosis. Neurologic:  Alert and oriented x3;  grossly normal neurologically.Marland Kitchen Psych:  Alert and cooperative. Normal mood and affect.  Imaging Studies: No results found.  Assessment and Plan:   Abigail Powers is a 75 y.o. y/o female has been referred for an abnormal CT which was performed for evaluation of weight loss and abdominal pain ongoing for many years. The CT scan demonstrated features of liver cirrhosis, she has no prior history of liver disease and had a normal Ct scan of the abdomen back in 2015. She does not abuse alcohol. She was also found to have moderate constipation and it is very likely the cause of her abdominal pain.    Plan  1. Liver cirrhosis: will obtain platelet count, INR, autoimmune and viral hepatitis panel. If negative will monitor LFT's Q 6 monthly.  2. Constipation : suggest high fiber diet as her diet is poor in fiber, she is also using hydrocodone which would cause constipation , continue magnesium citrate as needed and miralax daily if needed we can change it to linzess in the future. 3. Unintentional weight loss - will plan for EGD+colonoscopy.  4. Chronic abdominal pain : likely secondary to constipation - will check stool for H pylori too.    I have discussed alternative options, risks & benefits,  which include, but are not limited to, bleeding, infection, perforation,respiratory complication & drug reaction.  The patient agrees with this plan & written consent will be obtained.    Follow up  in 6 weeks   Dr Jonathon Bellows MD

## 2016-08-24 LAB — CERULOPLASMIN: CERULOPLASMIN: 24.8 mg/dL (ref 19.0–39.0)

## 2016-08-24 LAB — ALPHA-1-ANTITRYPSIN: A1 ANTITRYPSIN SER: 115 mg/dL (ref 90–200)

## 2016-08-24 LAB — HEPATITIS B SURFACE ANTIBODY, QUANTITATIVE

## 2016-08-24 LAB — HEPATITIS C ANTIBODY

## 2016-08-25 ENCOUNTER — Other Ambulatory Visit
Admission: RE | Admit: 2016-08-25 | Discharge: 2016-08-25 | Disposition: A | Discharge status: Home / Self Care | Payer: Medicare Other | Source: Ambulatory Visit | Attending: Gastroenterology | Admitting: Gastroenterology

## 2016-08-25 DIAGNOSIS — R1010 Upper abdominal pain, unspecified: Secondary | ICD-10-CM | POA: Diagnosis present

## 2016-08-27 LAB — CELIAC DISEASE PANEL
Endomysial Ab, IgA: NEGATIVE
IGA: 91 mg/dL (ref 64–422)

## 2016-08-27 LAB — ANTI-SMOOTH MUSCLE ANTIBODY, IGG: F-Actin IgG: 6 Units (ref 0–19)

## 2016-08-27 LAB — MITOCHONDRIAL ANTIBODIES: Mitochondrial M2 Ab, IgG: 8.7 Units (ref 0.0–20.0)

## 2016-08-27 LAB — H. PYLORI ANTIGEN, STOOL: H. Pylori Stool Ag, Eia: NEGATIVE

## 2016-08-30 ENCOUNTER — Encounter: Payer: Self-pay | Admitting: *Deleted

## 2016-08-31 ENCOUNTER — Encounter
Admission: RE | Disposition: A | Discharge status: Home / Self Care | Payer: Self-pay | Source: Ambulatory Visit | Attending: Gastroenterology

## 2016-08-31 ENCOUNTER — Ambulatory Visit: Payer: Medicare Other | Admitting: Anesthesiology

## 2016-08-31 ENCOUNTER — Encounter: Payer: Self-pay | Admitting: *Deleted

## 2016-08-31 ENCOUNTER — Ambulatory Visit
Admission: RE | Admit: 2016-08-31 | Discharge: 2016-08-31 | Disposition: A | Discharge status: Home / Self Care | Payer: Medicare Other | Source: Ambulatory Visit | Attending: Gastroenterology | Admitting: Gastroenterology

## 2016-08-31 DIAGNOSIS — Z6820 Body mass index (BMI) 20.0-20.9, adult: Secondary | ICD-10-CM | POA: Diagnosis not present

## 2016-08-31 DIAGNOSIS — E785 Hyperlipidemia, unspecified: Secondary | ICD-10-CM | POA: Insufficient documentation

## 2016-08-31 DIAGNOSIS — K219 Gastro-esophageal reflux disease without esophagitis: Secondary | ICD-10-CM | POA: Insufficient documentation

## 2016-08-31 DIAGNOSIS — K319 Disease of stomach and duodenum, unspecified: Secondary | ICD-10-CM | POA: Diagnosis not present

## 2016-08-31 DIAGNOSIS — J449 Chronic obstructive pulmonary disease, unspecified: Secondary | ICD-10-CM | POA: Insufficient documentation

## 2016-08-31 DIAGNOSIS — Z79899 Other long term (current) drug therapy: Secondary | ICD-10-CM | POA: Insufficient documentation

## 2016-08-31 DIAGNOSIS — R1013 Epigastric pain: Secondary | ICD-10-CM

## 2016-08-31 DIAGNOSIS — R634 Abnormal weight loss: Secondary | ICD-10-CM

## 2016-08-31 DIAGNOSIS — Z7951 Long term (current) use of inhaled steroids: Secondary | ICD-10-CM | POA: Insufficient documentation

## 2016-08-31 DIAGNOSIS — Z9049 Acquired absence of other specified parts of digestive tract: Secondary | ICD-10-CM | POA: Insufficient documentation

## 2016-08-31 HISTORY — PX: ESOPHAGOGASTRODUODENOSCOPY (EGD) WITH PROPOFOL: SHX5813

## 2016-08-31 SURGERY — ESOPHAGOGASTRODUODENOSCOPY (EGD) WITH PROPOFOL
Anesthesia: General

## 2016-08-31 MED ORDER — LIDOCAINE HCL (CARDIAC) 20 MG/ML IV SOLN
INTRAVENOUS | Status: DC | PRN
  Administered 2016-08-31: 60 mg via INTRAVENOUS

## 2016-08-31 MED ORDER — SODIUM CHLORIDE 0.9 % IV SOLN
INTRAVENOUS | Status: DC
  Administered 2016-08-31: 08:00:00 via INTRAVENOUS
  Administered 2016-08-31: 1000 mL via INTRAVENOUS

## 2016-08-31 MED ORDER — MIDAZOLAM HCL 2 MG/2ML IJ SOLN
INTRAMUSCULAR | Status: DC | PRN
  Administered 2016-08-31: 1 mg via INTRAVENOUS

## 2016-08-31 MED ORDER — GLYCOPYRROLATE 0.2 MG/ML IJ SOLN
INTRAMUSCULAR | Status: DC | PRN
  Administered 2016-08-31: 0.2 mg via INTRAVENOUS

## 2016-08-31 MED ORDER — PROPOFOL 10 MG/ML IV BOLUS
INTRAVENOUS | Status: DC | PRN
  Administered 2016-08-31: 50 mg via INTRAVENOUS

## 2016-08-31 NOTE — H&P (Signed)
Jonathon Bellows MD [address] [place],  [address] Phone: [phone] Fax : [phone]  Primary Care Physician:  Gayland Curry, MD Primary Gastroenterologist:  Dr. Jonathon Bellows   Pre-Procedure History & Physical: HPI:  Abigail Powers is a 75 y.o. female is here for an endoscopy.   Past Medical History:  Diagnosis Date  . Asthma   . COPD (chronic obstructive pulmonary disease) ([employer])   . GERD (gastroesophageal reflux disease)   . Hyperlipidemia     Past Surgical History:  Procedure Laterality Date  . ABDOMINAL HYSTERECTOMY    . APPENDECTOMY    . CHOLECYSTECTOMY      Prior to Admission medications   Medication Sig Start Date End Date Taking? Authorizing Provider  Calcium Carb-Ergocalciferol 250-125 MG-UNIT TABS Take by mouth.   Yes Historical Provider, MD  citalopram (CELEXA) 10 MG tablet Take 10 mg by mouth daily.   Yes Historical Provider, MD  clonazePAM (KLONOPIN) 0.5 MG tablet Take by mouth. 05/06/13  Yes Historical Provider, MD  diclofenac (VOLTAREN) 50 MG EC tablet Take 50 mg by mouth 2 (two) times daily.   Yes Historical Provider, MD  dicyclomine (BENTYL) 20 MG tablet Take 20 mg by mouth 3 (three) times daily before meals.   Yes Historical Provider, MD  docusate sodium (COLACE) 100 MG capsule Take 100 mg by mouth 2 (two) times daily.   Yes Historical Provider, MD  fluticasone (FLONASE) 50 MCG/ACT nasal spray Place 2 sprays into both nostrils daily.   Yes Historical Provider, MD  HYDROcodone-acetaminophen (NORCO) 5-325 MG tablet Take 1-2 tablets by mouth every 4 (four) hours as needed for moderate pain. 08/16/15  Yes Pierce Crane Beers, PA-C  lovastatin (MEVACOR) 10 MG tablet Take 10 mg by mouth at bedtime.   Yes Historical Provider, MD  Magnesium 250 MG TABS Take 1 tablet by mouth daily.   Yes Historical Provider, MD  multivitamin-iron-minerals-folic acid (CENTRUM) chewable tablet Chew 1 tablet by mouth daily.   Yes Historical Provider, MD  Omega-3  Fatty Acids (FISH OIL) 1000 MG CAPS Take 1 capsule by mouth daily.   Yes Historical Provider, MD  pantoprazole (PROTONIX) 40 MG tablet Take 40 mg by mouth daily.   Yes Historical Provider, MD  rOPINIRole (REQUIP) 0.5 MG tablet Take 0.5 mg by mouth at bedtime.   Yes Historical Provider, MD  simethicone (MYLICON) 0000000 MG chewable tablet Chew 375 mg by mouth every 6 (six) hours as needed for flatulence.   Yes Historical Provider, MD  SUMAtriptan (IMITREX) 50 MG tablet Take 50 mg by mouth every 2 (two) hours as needed for migraine. May repeat in 2 hours if headache persists or recurs.   Yes Historical Provider, MD  [facility] 160-4.5 MCG/ACT inhaler  07/13/15  Yes Historical Provider, MD  traZODone (DESYREL) 50 MG tablet Take 50 mg by mouth at bedtime.   Yes Historical Provider, MD  vitamin B-12 (CYANOCOBALAMIN) 1000 MCG tablet Take by mouth.   Yes Historical Provider, MD  polyethylene glycol powder (MIRALAX) powder Take 1 Container by mouth once.    Historical Provider, MD    Allergies as of 08/23/2016 - Review Complete 08/23/2016  Allergen Reaction Noted  . Penicillins Rash 08/16/2015    Family History  Problem Relation Age of Onset  . Heart disease Father   . Breast cancer Neg Hx     Social History   Social History  . Marital status: Married    Spouse name: N/A  . Number of children: N/A  . Years  of education: N/A   Occupational History  . Not on file.   Social History Main Topics  . Smoking status: Never Smoker  . Smokeless tobacco: Never Used  . Alcohol use No  . Drug use: No  . Sexual activity: Not on file   Other Topics Concern  . Not on file   Social History Narrative  . No narrative on file    Review of Systems: See HPI, otherwise negative ROS  Physical Exam: BP (!) 124/47   Pulse 68   Temp 97.6 F (36.4 C) (Oral)   Resp 16   Ht 5' 4.25" (1.632 m)   Wt 119 lb (54 kg)   SpO2 96%   BMI 20.27 kg/m  General:   Alert,  pleasant and cooperative in NAD Head:   Normocephalic and atraumatic. Neck:  Supple; no masses or thyromegaly. Lungs:  Clear throughout to auscultation.    Heart:  Regular rate and rhythm. Abdomen:  Soft, nontender and nondistended. Normal bowel sounds, without guarding, and without rebound.   Neurologic:  Alert and  oriented x4;  grossly normal neurologically.  Impression/Plan: Abigail Powers is here for an endoscopy to be performed for abdominal pain  Risks, benefits, limitations, and alternatives regarding  endoscopy have been reviewed with the patient.  Questions have been answered.  All parties agreeable.   Jonathon Bellows, MD  08/31/2016, 8:04 AM

## 2016-08-31 NOTE — Transfer of Care (Signed)
Immediate Anesthesia Transfer of Care Note  Patient: Abigail Powers  Procedure(s) Performed: Procedure(s): ESOPHAGOGASTRODUODENOSCOPY (EGD) WITH PROPOFOL (N/A)  Patient Location: Endoscopy Unit  Anesthesia Type:General  Level of Consciousness: awake, alert , oriented and patient cooperative  Airway & Oxygen Therapy: Patient Spontanous Breathing and Patient connected to nasal cannula oxygen  Post-op Assessment: Report given to RN, Post -op Vital signs reviewed and stable and Patient moving all extremities X 4  Post vital signs: Reviewed and stable  Last Vitals:  Vitals:   08/31/16 0733  BP: (!) 124/47  Pulse: 68  Resp: 16  Temp: 36.4 C    Last Pain:  Vitals:   08/31/16 0733  TempSrc: Oral         Complications: No apparent anesthesia complications

## 2016-08-31 NOTE — Anesthesia Preprocedure Evaluation (Signed)
Anesthesia Evaluation  Patient identified by MRN, date of birth, ID band Patient awake    Reviewed: Allergy & Precautions, H&P , NPO status , Patient's Chart, lab work & pertinent test results, reviewed documented beta blocker date and time   Airway Mallampati: II   Neck ROM: full    Dental  (+) Teeth Intact   Pulmonary neg pulmonary ROS, asthma , COPD,    Pulmonary exam normal        Cardiovascular negative cardio ROS Normal cardiovascular exam Rhythm:regular Rate:Normal     Neuro/Psych  Headaches, PSYCHIATRIC DISORDERS negative neurological ROS  negative psych ROS   GI/Hepatic negative GI ROS, Neg liver ROS, GERD  Medicated,  Endo/Other  negative endocrine ROS  Renal/GU negative Renal ROS  negative genitourinary   Musculoskeletal   Abdominal   Peds  Hematology negative hematology ROS (+)   Anesthesia Other Findings Past Medical History: No date: Asthma No date: COPD (chronic obstructive pulmonary disease) (* No date: GERD (gastroesophageal reflux disease) No date: Hyperlipidemia Past Surgical History: No date: ABDOMINAL HYSTERECTOMY   Reproductive/Obstetrics negative OB ROS                             Anesthesia Physical Anesthesia Plan  ASA: III  Anesthesia Plan: General   Post-op Pain Management:    Induction:   Airway Management Planned:   Additional Equipment:   Intra-op Plan:   Post-operative Plan:   Informed Consent: I have reviewed the patients History and Physical, chart, labs and discussed the procedure including the risks, benefits and alternatives for the proposed anesthesia with the patient or authorized representative who has indicated his/her understanding and acceptance.   Dental Advisory Given  Plan Discussed with: CRNA  Anesthesia Plan Comments:         Anesthesia Quick Evaluation

## 2016-08-31 NOTE — Op Note (Signed)
[facility] Gastroenterology Patient Name: Abigail Powers Procedure Date: 08/31/2016 8:10 AM MRN: [id number] Account #: [id number] Date of Birth: 11/10/1940 Admit Type: Outpatient Age: 75 Room: [facility] ENDO ROOM 3 Gender: Female Note Status: Finalized Procedure:            Upper GI endoscopy Indications:          Epigastric abdominal pain, Weight loss Providers:            Jonathon Bellows MD, MD Referring MD:         Gayland Curry MD, MD (Referring MD) Medicines:            Monitored Anesthesia Care Complications:        No immediate complications. Procedure:            Pre-Anesthesia Assessment:                       - Prior to the procedure, a History and Physical was                        performed, and patient medications, allergies and                        sensitivities were reviewed. The patient's tolerance of                        previous anesthesia was reviewed.                       - The risks and benefits of the procedure and the                        sedation options and risks were discussed with the                        patient. All questions were answered and informed                        consent was obtained.                       - The risks and benefits of the procedure and the                        sedation options and risks were discussed with the                        patient. All questions were answered and informed                        consent was obtained.                       - ASA Grade Assessment: III - A patient with severe                        systemic disease.                       After obtaining informed consent, the endoscope was  passed under direct vision. Throughout the procedure,                        the patient's blood pressure, pulse, and oxygen                        saturations were monitored continuously. The Endoscope                        was introduced through the mouth, and  advanced to the                        third part of duodenum. The upper GI endoscopy was                        accomplished with ease. The patient tolerated the                        procedure well. Findings:      The examined duodenum was normal.      The esophagus was normal.      Diffuse moderate inflammation characterized by congestion (edema) and       erythema was found in the gastric antrum. Biopsies were taken with a       cold forceps for histology. Impression:           - Normal examined duodenum.                       - Normal esophagus.                       - Gastritis. Biopsied. Recommendation:       - Patient has a contact number available for                        emergencies. The signs and symptoms of potential                        delayed complications were discussed with the patient.                        Return to normal activities tomorrow. Written discharge                        instructions were provided to the patient.                       - Resume previous diet.                       - Discharge patient to home (with escort).                       - No ibuprofen, naproxen, or other non-steroidal                        anti-inflammatory drugs. Procedure Code(s):    --- Professional ---                       [phone], Esophagogastroduodenoscopy, flexible, transoral;  with biopsy, single or multiple Diagnosis Code(s):    --- Professional ---                       K29.70, Gastritis, unspecified, without bleeding                       R63.4, Abnormal weight loss                       R10.13, Epigastric pain CPT copyright 2016 American Medical Association. All rights reserved. The codes documented in this report are preliminary and upon coder review may  be revised to meet current compliance requirements. Jonathon Bellows, MD Jonathon Bellows MD, MD 08/31/2016 8:21:56 AM This report has been signed electronically. Number of Addenda: 0 Note  Initiated On: 08/31/2016 8:10 AM      [facility]

## 2016-08-31 NOTE — Anesthesia Postprocedure Evaluation (Signed)
Anesthesia Post Note  Patient: Raymie Gorden Pohlman  Procedure(s) Performed: Procedure(s) (LRB): ESOPHAGOGASTRODUODENOSCOPY (EGD) WITH PROPOFOL (N/A)  Patient location during evaluation: PACU Anesthesia Type: General Level of consciousness: awake and alert Pain management: pain level controlled Vital Signs Assessment: post-procedure vital signs reviewed and stable Respiratory status: spontaneous breathing, nonlabored ventilation, respiratory function stable and patient connected to nasal cannula oxygen Cardiovascular status: blood pressure returned to baseline and stable Postop Assessment: no signs of nausea or vomiting Anesthetic complications: no    Last Vitals:  Vitals:   08/31/16 0845 08/31/16 0855  BP: 134/67 125/76  Pulse: 90 87  Resp: 14 18  Temp:      Last Pain:  Vitals:   08/31/16 0845  TempSrc:   PainSc: 0-No pain                 Molli Barrows

## 2016-09-01 ENCOUNTER — Encounter: Payer: Self-pay | Admitting: Gastroenterology

## 2016-09-01 LAB — SURGICAL PATHOLOGY

## 2016-09-07 ENCOUNTER — Telehealth: Payer: Self-pay

## 2016-09-07 NOTE — Telephone Encounter (Signed)
Pt notified of EGD results. Pt has follow up appt at the end of December.

## 2016-09-07 NOTE — Telephone Encounter (Signed)
-----   Message from Jonathon Bellows, MD sent at 09/06/2016 10:23 AM EST ----- Reactive gastropathy on bx

## 2016-09-26 ENCOUNTER — Other Ambulatory Visit: Payer: Self-pay

## 2016-09-27 ENCOUNTER — Other Ambulatory Visit
Admission: RE | Admit: 2016-09-27 | Discharge: 2016-09-27 | Disposition: A | Discharge status: Home / Self Care | Payer: Medicare Other | Source: Ambulatory Visit | Attending: Gastroenterology | Admitting: Gastroenterology

## 2016-09-27 ENCOUNTER — Encounter: Payer: Self-pay | Admitting: Gastroenterology

## 2016-09-27 ENCOUNTER — Ambulatory Visit (INDEPENDENT_AMBULATORY_CARE_PROVIDER_SITE_OTHER): Payer: Medicare Other | Admitting: Gastroenterology

## 2016-09-27 VITALS — BP 144/62 | HR 76 | Temp 97.8°F | Ht 64.0 in | Wt 120.0 lb

## 2016-09-27 DIAGNOSIS — K7460 Unspecified cirrhosis of liver: Secondary | ICD-10-CM

## 2016-09-27 DIAGNOSIS — K5900 Constipation, unspecified: Secondary | ICD-10-CM

## 2016-09-27 MED ORDER — DICYCLOMINE HCL 10 MG PO CAPS: 10.0000 mg | ORAL_CAPSULE | Freq: Three times a day (TID) | ORAL | 1 refills | Status: DC

## 2016-09-27 NOTE — Progress Notes (Signed)
Primary Care Physician: Gayland Curry, MD  Primary Gastroenterologist:  Dr. Jonathon Bellows   No chief complaint on file.   HPI: Abigail Powers is a 75 y.o. female here for follow up . She was last seen on 08/23/16 when she was referred for an abnormal CT scan of the abdomen which was performed on 06/15/2016 for abdominal pain which revealed changes of the liver suggestive of cirrhosis and moderate stool burden in the colon. A CT scan in 08/2014 did not reveal any abnormalities of her liver. LFT's in 01/2016, 11/2014 were normal particularly with albumin.She also mentioned unintentional weight loss.   Her abdominal pain has been ongoing since 1960 which I felt was due to constipation   Interval history 08/2016-09/2016  EGD 08/31/16 showed gastritis with bx showing reactive gastropathy with no H pylori . H pylori stool antigen was negative.  I advised her to stop all NSAID Hb 13.1, normal platelet count. Hep C antibody negative. Ferritin, iron studies,celiac serology , ceruloplasmin, Factin ,A1At,AMA were normal/negative Denies ever being obese in the past . Her abdominal pain is better.She is taking the protonix .  She says she is taking miralax with magnesium and has a bowel movement on most day.   She had to have a colonoscopy but could not have it scheduled due to insurance not covering for the same.   Current Outpatient Prescriptions  Medication Sig Dispense Refill  . Calcium Carb-Ergocalciferol 250-125 MG-UNIT TABS Take by mouth.    . citalopram (CELEXA) 10 MG tablet Take 10 mg by mouth daily.    . clonazePAM (KLONOPIN) 0.5 MG tablet Take by mouth.    . diclofenac (VOLTAREN) 50 MG EC tablet     . dicyclomine (BENTYL) 20 MG tablet Take 20 mg by mouth 3 (three) times daily before meals.    . docusate sodium (COLACE) 100 MG capsule Take 100 mg by mouth 2 (two) times daily.    . fluticasone (FLONASE) 50 MCG/ACT nasal spray Place 2 sprays into both nostrils daily.    Marland Kitchen  HYDROcodone-acetaminophen (NORCO) 5-325 MG tablet Take 1-2 tablets by mouth every 4 (four) hours as needed for moderate pain. 15 tablet 0  . ipratropium (ATROVENT) 0.06 % nasal spray     . levofloxacin (LEVAQUIN) 500 MG tablet     . lovastatin (MEVACOR) 10 MG tablet Take 10 mg by mouth at bedtime.    . Magnesium 250 MG TABS Take 1 tablet by mouth daily.    . multivitamin-iron-minerals-folic acid (CENTRUM) chewable tablet Chew 1 tablet by mouth daily.    . Omega-3 Fatty Acids (FISH OIL) 1000 MG CAPS Take 1 capsule by mouth daily.    . pantoprazole (PROTONIX) 40 MG tablet Take 40 mg by mouth daily.    . polyethylene glycol powder (MIRALAX) powder Take 1 Container by mouth once.    . predniSONE (DELTASONE) 20 MG tablet     . rOPINIRole (REQUIP) 0.5 MG tablet Take 0.5 mg by mouth at bedtime.    . simethicone (MYLICON) 0000000 MG chewable tablet Chew 375 mg by mouth every 6 (six) hours as needed for flatulence.    . SUMAtriptan (IMITREX) 50 MG tablet Take 50 mg by mouth every 2 (two) hours as needed for migraine. May repeat in 2 hours if headache persists or recurs.    . SYMBICORT 160-4.5 MCG/ACT inhaler     . traZODone (DESYREL) 50 MG tablet Take 50 mg by mouth at bedtime.    . vitamin B-12 (CYANOCOBALAMIN) 1000 MCG  tablet Take by mouth.     No current facility-administered medications for this visit.     Allergies as of 09/27/2016 - Review Complete 08/31/2016  Allergen Reaction Noted  . Penicillins Rash 08/16/2015    ROS:  General: Negative for anorexia, weight loss, fever, chills, fatigue, weakness. ENT: Negative for hoarseness, difficulty swallowing , nasal congestion. CV: Negative for chest pain, angina, palpitations, dyspnea on exertion, peripheral edema.  Respiratory: Negative for dyspnea at rest, dyspnea on exertion, cough, sputum, wheezing.  GI: See history of present illness. GU:  Negative for dysuria, hematuria, urinary incontinence, urinary frequency, nocturnal urination.  Endo:  Negative for unusual weight change.    Physical Examination: BP (!) 144/62   Pulse 76   Temp 97.8 F (36.6 C) (Oral)   Ht 5\' 4"  (1.626 m)   Wt 120 lb (54.4 kg)   BMI 20.60 kg/m    There were no vitals taken for this visit.  General: Well-nourished, well-developed in no acute distress.  Eyes: No icterus. Conjunctivae pink. Mouth: Oropharyngeal mucosa moist and pink , no lesions erythema or exudate. Lungs: Clear to auscultation bilaterally. Non-labored. Heart: Regular rate and rhythm, no murmurs rubs or gallops.  Abdomen: Bowel sounds are normal, nontender, nondistended, no hepatosplenomegaly or masses, no abdominal bruits or hernia , no rebound or guarding.   Extremities: No lower extremity edema. No clubbing or deformities. Neuro: Alert and oriented x 3.  Grossly intact. Skin: Warm and dry, no jaundice.   Psych: Alert and cooperative, normal mood and affect.  Imaging Studies: No results found.  Assessment and Plan:   Abigail Powers is a 75 y.o. y/o female  Here for a follow up for an abnormal CT which was performed for evaluation of weight loss and abdominal pain ongoing for many years. The CT scan demonstrated features of liver cirrhosis, she has no prior history of liver disease and had a normal Ct scan of the abdomen back in 2015. She does not abuse alcohol. 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 She was also found to have moderate constipation and it is very likely the cause of her abdominal pain.   Plan  1. 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  Requires hepatitis B vaccine  2. Constipation : doing well  3. Unintentional weight loss - Weight been stable since last 1 month at 120 lbs  4. Chronic abdominal pain : likely secondary to constipation+/- NSAID use- still on voltaren advised to limit usage or avoid, if used ensure she needs to be on PPI , advised to take  miralax daily , high fiber diet    Dr Jonathon Bellows  MD F/u in 3 months

## 2016-09-28 LAB — HEPATITIS B SURFACE ANTIGEN: HEP B S AG: NEGATIVE

## 2016-12-12 DIAGNOSIS — K7469 Other cirrhosis of liver: Secondary | ICD-10-CM | POA: Insufficient documentation

## 2016-12-13 ENCOUNTER — Other Ambulatory Visit: Payer: Self-pay | Admitting: Family Medicine

## 2016-12-13 DIAGNOSIS — R928 Other abnormal and inconclusive findings on diagnostic imaging of breast: Secondary | ICD-10-CM

## 2016-12-20 ENCOUNTER — Other Ambulatory Visit: Payer: Self-pay | Admitting: Neurology

## 2016-12-20 DIAGNOSIS — G3184 Mild cognitive impairment, so stated: Secondary | ICD-10-CM

## 2017-01-01 ENCOUNTER — Ambulatory Visit
Admission: RE | Admit: 2017-01-01 | Discharge: 2017-01-01 | Disposition: A | Discharge status: Home / Self Care | Payer: Medicare Other | Source: Ambulatory Visit | Attending: Neurology | Admitting: Neurology

## 2017-01-01 DIAGNOSIS — G3184 Mild cognitive impairment, so stated: Secondary | ICD-10-CM | POA: Diagnosis not present

## 2017-01-01 DIAGNOSIS — M2548 Effusion, other site: Secondary | ICD-10-CM | POA: Insufficient documentation

## 2017-01-12 ENCOUNTER — Ambulatory Visit
Admission: RE | Admit: 2017-01-12 | Discharge: 2017-01-12 | Disposition: A | Discharge status: Home / Self Care | Payer: Medicare Other | Source: Ambulatory Visit | Attending: Family Medicine | Admitting: Family Medicine

## 2017-01-12 DIAGNOSIS — R928 Other abnormal and inconclusive findings on diagnostic imaging of breast: Secondary | ICD-10-CM

## 2017-01-22 ENCOUNTER — Other Ambulatory Visit: Payer: Self-pay | Admitting: Gastroenterology

## 2017-06-27 ENCOUNTER — Other Ambulatory Visit: Payer: Self-pay

## 2017-06-27 ENCOUNTER — Telehealth: Payer: Self-pay | Admitting: Gastroenterology

## 2017-06-27 MED ORDER — DICYCLOMINE HCL 10 MG PO CAPS: ORAL_CAPSULE | ORAL | 0 refills | Status: DC

## 2017-06-27 NOTE — Telephone Encounter (Signed)
Patient needs a refill of Dicyclomine 10 mg called into [facility]. She would like a call back to let her know you called it in. [phone]

## 2017-06-27 NOTE — Telephone Encounter (Signed)
Patient requested call stating medication has been reordered.   LVM for callback for any questions.

## 2017-07-25 ENCOUNTER — Encounter: Payer: Self-pay | Admitting: Gastroenterology

## 2017-07-25 ENCOUNTER — Ambulatory Visit (INDEPENDENT_AMBULATORY_CARE_PROVIDER_SITE_OTHER): Payer: Medicare Other | Admitting: Gastroenterology

## 2017-07-25 ENCOUNTER — Encounter (INDEPENDENT_AMBULATORY_CARE_PROVIDER_SITE_OTHER): Payer: Self-pay

## 2017-07-25 ENCOUNTER — Other Ambulatory Visit
Admission: RE | Admit: 2017-07-25 | Discharge: 2017-07-25 | Disposition: A | Discharge status: Home / Self Care | Payer: Medicare Other | Source: Ambulatory Visit | Attending: Gastroenterology | Admitting: Gastroenterology

## 2017-07-25 VITALS — BP 156/66 | HR 76 | Temp 98.5°F | Ht 64.0 in | Wt 115.0 lb

## 2017-07-25 DIAGNOSIS — K7460 Unspecified cirrhosis of liver: Secondary | ICD-10-CM | POA: Insufficient documentation

## 2017-07-25 LAB — COMPREHENSIVE METABOLIC PANEL
ALBUMIN: 3.9 g/dL (ref 3.5–5.0)
ALK PHOS: 45 U/L (ref 38–126)
ALT: 20 U/L (ref 14–54)
AST: 28 U/L (ref 15–41)
Anion gap: 9 (ref 5–15)
BILIRUBIN TOTAL: 0.8 mg/dL (ref 0.3–1.2)
BUN: 20 mg/dL (ref 6–20)
CALCIUM: 9.8 mg/dL (ref 8.9–10.3)
CO2: 28 mmol/L (ref 22–32)
Chloride: 102 mmol/L (ref 101–111)
Creatinine, Ser: 0.82 mg/dL (ref 0.44–1.00)
GFR calc Af Amer: >60 mL/min (ref >60)
GLUCOSE: 96 mg/dL (ref 65–99)
POTASSIUM: 4.1 mmol/L (ref 3.5–5.1)
Sodium: 139 mmol/L (ref 135–145)
TOTAL PROTEIN: 6.6 g/dL (ref 6.5–8.1)

## 2017-07-25 LAB — CBC WITH DIFFERENTIAL/PLATELET
Basophils Absolute: 0 10*3/uL (ref 0–0.1)
Basophils Relative: 1 %
EOS ABS: 0.1 10*3/uL (ref 0–0.7)
EOS PCT: 2 %
HCT: 39.5 % (ref 35.0–47.0)
HEMOGLOBIN: 13.2 g/dL (ref 12.0–16.0)
LYMPHS ABS: 1.1 10*3/uL (ref 1.0–3.6)
LYMPHS PCT: 28 %
MCH: 32.5 pg (ref 26.0–34.0)
MCHC: 33.5 g/dL (ref 32.0–36.0)
MCV: 96.8 fL (ref 80.0–100.0)
MONOS PCT: 10 %
Monocytes Absolute: 0.4 10*3/uL (ref 0.2–0.9)
Neutro Abs: 2.3 10*3/uL (ref 1.4–6.5)
Neutrophils Relative %: 59 %
PLATELETS: 238 10*3/uL (ref 150–440)
RBC: 4.07 MIL/uL (ref 3.80–5.20)
RDW: 14 % (ref 11.5–14.5)
WBC: 3.8 10*3/uL (ref 3.6–11.0)

## 2017-07-25 LAB — PROTIME-INR
INR: 1.04
PROTHROMBIN TIME: 13.5 s (ref 11.4–15.2)

## 2017-07-25 NOTE — Addendum Note (Signed)
Addended by: Peggye Ley on: 07/25/2017 12:09 PM   Modules accepted: Orders

## 2017-07-25 NOTE — Progress Notes (Signed)
Jonathon Bellows MD, MRCP(U.K) [address]  [place]  [place], [place] [address]  Main: [phone]  Fax: [phone]   Primary Care Physician: Gayland Curry, MD  Primary Gastroenterologist:  Dr. Jonathon Bellows   No chief complaint on file.   HPI: Abigail Powers is a 76 y.o. female  She is here to follow up for abnormal LFT's.   Summary of history : . She was last seen on 09/2017  . She was referred for an abnormal CT scan of the abdomen which was performed on 06/15/2016 for abdominal pain which revealed changes of the liver suggestive of cirrhosis and moderate stool burden in the colon. A CT scan in 08/2014 did not reveal any abnormalities of her liver. LFT's in 01/2016, 11/2014 were normal particularly with albumin.She also mentioned unintentional weight loss. Her abdominal pain has been ongoing since 1960 which I felt was due to constipation .EGD 08/31/16 showed gastritis with bx showing reactive gastropathy with no H pylori . H pylori stool antigen was negative. I advised her to stop all NSAID.Hb 13.1, normal platelet count. Hep C antibody negative. Ferritin, iron studies,celiac serology , ceruloplasmin, Factin ,A1At,AMA were normal/negative.She had to have a colonoscopy but could not have it scheduled due to insurance not covering for the same.    Interval history   09/2016-  07/25/2017   Weigh stable. Takes miralax daily-helps her go , at times it does not work. Says would like to try something different.     Labs: 07/2017 normal platelet count and albumin .   Current Outpatient Prescriptions  Medication Sig Dispense Refill  . atorvastatin (LIPITOR) 20 MG tablet Take by mouth.    . ferrous sulfate 325 (65 FE) MG EC tablet One po qd for restless legs    . ipratropium-albuterol (DUONEB) 0.5-2.5 (3) MG/3ML SOLN Inhale into the lungs.    . Calcium Carb-Ergocalciferol 250-125 MG-UNIT TABS Take by mouth.    . cetirizine (ZYRTEC) 10 MG tablet Take by mouth.    .  citalopram (CELEXA) 20 MG tablet     . clonazePAM (KLONOPIN) 0.5 MG tablet Take by mouth.    . diclofenac (VOLTAREN) 50 MG EC tablet     . dicyclomine (BENTYL) 10 MG capsule TAKE (1) CAPSULE BY MOUTH FOUR TIMES A DAY BEFORE MEALS AND AT BEDTIME 120 capsule 0  . DOCOSAHEXAENOIC ACID PO Take by mouth.    . DOCUSATE CALCIUM PO Take by mouth.    . docusate sodium (COLACE) 100 MG capsule Take 100 mg by mouth 2 (two) times daily.    . fluticasone (FLONASE) 50 MCG/ACT nasal spray Place 2 sprays into both nostrils daily.    Marland Kitchen HYDROcodone-acetaminophen (NORCO) 5-325 MG tablet Take 1-2 tablets by mouth every 4 (four) hours as needed for moderate pain. (Patient not taking: Reported on 09/27/2016) 15 tablet 0  . ipratropium (ATROVENT) 0.06 % nasal spray     . levofloxacin (LEVAQUIN) 500 MG tablet     . lovastatin (MEVACOR) 10 MG tablet Take 10 mg by mouth at bedtime.    . Magnesium 250 MG TABS Take 1 tablet by mouth daily.    . Multiple Vitamins-Minerals (MULTIVITAMIN ADULTS PO) Take by mouth.    . multivitamin-iron-minerals-folic acid (CENTRUM) chewable tablet Chew 1 tablet by mouth daily.    Marland Kitchen neomycin-polymyxin b-dexamethasone (MAXITROL) 3.5-10000-0.1 SUSP     . Omega-3 Fatty Acids (FISH OIL) 1000 MG CAPS Take 1 capsule by mouth daily.    . pantoprazole (PROTONIX) 40 MG  tablet Take 40 mg by mouth daily.    . polyethylene glycol powder (MIRALAX) powder Take 1 Container by mouth once.    . predniSONE (DELTASONE) 20 MG tablet     . rOPINIRole (REQUIP) 0.5 MG tablet Take 0.5 mg by mouth at bedtime.    . simethicone (MYLICON) 641 MG chewable tablet Chew 375 mg by mouth every 6 (six) hours as needed for flatulence.    . SUMAtriptan (IMITREX) 50 MG tablet Take 50 mg by mouth every 2 (two) hours as needed for migraine. May repeat in 2 hours if headache persists or recurs.    . SYMBICORT 160-4.5 MCG/ACT inhaler     . traZODone (DESYREL) 50 MG tablet Take 50 mg by mouth at bedtime.    . vitamin B-12  (CYANOCOBALAMIN) 1000 MCG tablet Take by mouth.     No current facility-administered medications for this visit.     Allergies as of 07/25/2017 - Review Complete 09/27/2016  Allergen Reaction Noted  . Penicillins Rash 08/16/2015    ROS:  General: Negative for anorexia, weight loss, fever, chills, fatigue, weakness. ENT: Negative for hoarseness, difficulty swallowing , nasal congestion. CV: Negative for chest pain, angina, palpitations, dyspnea on exertion, peripheral edema.  Respiratory: Negative for dyspnea at rest, dyspnea on exertion, cough, sputum, wheezing.  GI: See history of present illness. GU:  Negative for dysuria, hematuria, urinary incontinence, urinary frequency, nocturnal urination.  Endo: Negative for unusual weight change.    Physical Examination:   There were no vitals taken for this visit.  General: Well-nourished, well-developed in no acute distress.  Eyes: No icterus. Conjunctivae pink. Mouth: Oropharyngeal mucosa moist and pink , no lesions erythema or exudate. Lungs: Clear to auscultation bilaterally. Non-labored. Heart: Regular rate and rhythm, no murmurs rubs or gallops.  Abdomen: Bowel sounds are normal, nontender, nondistended, no hepatosplenomegaly or masses, no abdominal bruits or hernia , no rebound or guarding.   Extremities: No lower extremity edema. No clubbing or deformities. Neuro: Alert and oriented x 3.  Grossly intact. Skin: Warm and dry, no jaundice.   Psych: Alert and cooperative, normal mood and affect.  BP (!) 156/66   Pulse 76   Temp 98.5 F (36.9 C) (Oral)   Ht 5\' 4"  (1.626 m)   Wt 115 lb (52.2 kg)   BMI 19.74 kg/m   Imaging Studies: No results found.  Assessment and Plan:   Abigail Powers is a 76 y.o. y/o female here to follow up for liver cirrhosis, she has no prior history of liver disease and had a normal Ct scan of the abdomen back in 2015. She does not abuse alcohol. 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 She was  also found to have moderate constipation and it is very likely the cause of her abdominal pain.   Plan  1. 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 Recheck hep A/ B serology  2. Constipation : would like  Atrial of amitiza -samples provided for 6 months  3. Chronic abdominal pain : likely secondary to constipation. Discussed risks vs benefits of a colonoscopy - she is not keen presently    Dr Jonathon Bellows  MD,MRCP [facility]) Follow up in 6 months

## 2017-07-26 ENCOUNTER — Telehealth: Payer: Self-pay | Admitting: Gastroenterology

## 2017-07-26 LAB — HEPATITIS B SURFACE ANTIBODY, QUANTITATIVE: Hepatitis B-Post: >1000 m[IU]/mL (ref >9.9)

## 2017-07-26 LAB — HEPATITIS A ANTIBODY, TOTAL: Hep A Total Ab: POSITIVE — AB

## 2017-07-26 NOTE — Telephone Encounter (Signed)
Patient left a voice message needing to know where does she need to go to for her ultrasound and does she have chemical damage or cirrhosis? Please call

## 2017-07-27 LAB — HEPATITIS B SURFACE ANTIBODY,QUALITATIVE: HEP B S AB: REACTIVE

## 2017-07-30 ENCOUNTER — Other Ambulatory Visit: Payer: Self-pay | Admitting: Family Medicine

## 2017-07-30 DIAGNOSIS — Z1231 Encounter for screening mammogram for malignant neoplasm of breast: Secondary | ICD-10-CM

## 2017-07-31 ENCOUNTER — Ambulatory Visit: Admission: RE | Admit: 2017-07-31 | Payer: Medicare Other | Source: Ambulatory Visit

## 2017-08-06 ENCOUNTER — Ambulatory Visit
Admission: RE | Admit: 2017-08-06 | Discharge: 2017-08-06 | Disposition: A | Discharge status: Home / Self Care | Payer: Medicare Other | Source: Ambulatory Visit | Attending: Gastroenterology | Admitting: Gastroenterology

## 2017-08-06 ENCOUNTER — Telehealth: Payer: Self-pay | Admitting: Gastroenterology

## 2017-08-06 ENCOUNTER — Other Ambulatory Visit: Payer: Self-pay

## 2017-08-06 DIAGNOSIS — Z9049 Acquired absence of other specified parts of digestive tract: Secondary | ICD-10-CM | POA: Diagnosis not present

## 2017-08-06 DIAGNOSIS — K769 Liver disease, unspecified: Secondary | ICD-10-CM | POA: Insufficient documentation

## 2017-08-06 DIAGNOSIS — K7460 Unspecified cirrhosis of liver: Secondary | ICD-10-CM | POA: Diagnosis present

## 2017-08-06 MED ORDER — LUBIPROSTONE 24 MCG PO CAPS: 24.0000 ug | ORAL_CAPSULE | Freq: Two times a day (BID) | ORAL | 3 refills | Status: DC

## 2017-08-06 NOTE — Telephone Encounter (Signed)
*  STAT* If patient is at the pharmacy, call can be transferred to refill team.   1. Which medications need to be refilled? (please list name of each medication and dose if known) Amitiza 24 mcg (patient want generic)  2. Which pharmacy/location (including street and city if local pharmacy) is medication to be sent to? [facility]  3. Do they need a 30 day or 90 day supply? 30 day

## 2017-08-09 ENCOUNTER — Telehealth: Payer: Self-pay

## 2017-08-09 NOTE — Telephone Encounter (Signed)
Advised patient of lab results per Dr. Vicente Males.   [place] Normal - repeat in 6 months  Immune to Hep A/B

## 2017-08-14 ENCOUNTER — Ambulatory Visit
Admission: RE | Admit: 2017-08-14 | Discharge: 2017-08-14 | Disposition: A | Discharge status: Home / Self Care | Payer: Medicare Other | Source: Ambulatory Visit | Attending: Family Medicine | Admitting: Family Medicine

## 2017-08-14 DIAGNOSIS — Z1231 Encounter for screening mammogram for malignant neoplasm of breast: Secondary | ICD-10-CM | POA: Diagnosis not present

## 2017-12-12 ENCOUNTER — Telehealth: Payer: Self-pay | Admitting: Gastroenterology

## 2017-12-12 ENCOUNTER — Other Ambulatory Visit: Payer: Self-pay

## 2017-12-12 MED ORDER — PANTOPRAZOLE SODIUM 20 MG PO TBEC: 40.0000 mg | DELAYED_RELEASE_TABLET | Freq: Every day | ORAL | 0 refills | Status: DC

## 2017-12-12 NOTE — Telephone Encounter (Signed)
Pt would like for Dr. Vicente Males to send Rx Protonic 90 day supply to [facility] please call pt once send please

## 2017-12-24 ENCOUNTER — Ambulatory Visit
Admission: EM | Admit: 2017-12-24 | Discharge: 2017-12-24 | Disposition: A | Discharge status: Home / Self Care | Payer: Medicare Other | Attending: Family Medicine | Admitting: Family Medicine

## 2017-12-24 ENCOUNTER — Other Ambulatory Visit: Payer: Self-pay

## 2017-12-24 ENCOUNTER — Encounter: Payer: Self-pay | Admitting: Emergency Medicine

## 2017-12-24 DIAGNOSIS — R300 Dysuria: Secondary | ICD-10-CM | POA: Diagnosis present

## 2017-12-24 DIAGNOSIS — Z90710 Acquired absence of both cervix and uterus: Secondary | ICD-10-CM | POA: Insufficient documentation

## 2017-12-24 DIAGNOSIS — N390 Urinary tract infection, site not specified: Secondary | ICD-10-CM | POA: Diagnosis not present

## 2017-12-24 DIAGNOSIS — Z79899 Other long term (current) drug therapy: Secondary | ICD-10-CM | POA: Diagnosis not present

## 2017-12-24 DIAGNOSIS — Z9049 Acquired absence of other specified parts of digestive tract: Secondary | ICD-10-CM | POA: Insufficient documentation

## 2017-12-24 DIAGNOSIS — R319 Hematuria, unspecified: Secondary | ICD-10-CM | POA: Diagnosis present

## 2017-12-24 LAB — URINALYSIS, COMPLETE (UACMP) WITH MICROSCOPIC
Bilirubin Urine: NEGATIVE
GLUCOSE, UA: NEGATIVE mg/dL
Ketones, ur: NEGATIVE mg/dL
NITRITE: NEGATIVE
Protein, ur: NEGATIVE mg/dL
SQUAMOUS EPITHELIAL / LPF: NONE SEEN
Specific Gravity, Urine: <1.005 — ABNORMAL LOW (ref 1.005–1.030)
pH: 6 (ref 5.0–8.0)

## 2017-12-24 MED ORDER — NITROFURANTOIN MONOHYD MACRO 100 MG PO CAPS: 100.0000 mg | ORAL_CAPSULE | Freq: Two times a day (BID) | ORAL | 0 refills | Status: DC

## 2017-12-24 NOTE — ED Provider Notes (Signed)
[facility]    CSN: [id number] Arrival date & time: 12/24/17  6767     History   Chief Complaint Chief Complaint  Patient presents with  . Hematuria  . Dysuria    HPI Abigail Powers is a 77 y.o. female.   HPI  77 year old female who presents with dysuria started 3 days ago.  She also has  gross hematuria with it.  Had no fever or chills.  Denies any nausea vomiting or back pain.  Allergic to penicillin.  Has used Azo for 2 days but found this is not helping.  Denies any vaginal discharge.  She has a history of cirrhosis.         Past Medical History:  Diagnosis Date  . Asthma   . COPD (chronic obstructive pulmonary disease) ([place])   . GERD (gastroesophageal reflux disease)   . Hyperlipidemia     Patient Active Problem List   Diagnosis Date Noted  . Other cirrhosis of liver ([place]) 12/12/2016  . Abdominal pain, epigastric   . Abnormal CT of the abdomen 08/23/2016  . Constipation 08/23/2016  . Abnormal weight loss 08/23/2016  . Digital mucous cyst of finger of left hand 12/31/2015  . COPD, mild ([place]) 12/06/2015  . Allergic rhinitis 08/30/2015  . Anxiety 08/30/2015  . Arthritis 08/30/2015  . Depression 08/30/2015  . Acid reflux 08/30/2015  . Hyperglycemia, unspecified 08/30/2015  . Hyperlipidemia, unspecified 08/30/2015  . Cannot sleep 08/30/2015  . Headache, migraine 08/30/2015  . OP (osteoporosis) 08/30/2015  . Abdominal cramping 08/30/2015  . 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 Bronchiectasis ([place]) 03/23/2014  . Restless leg 02/03/2013  . Avitaminosis D 04/03/2012    Past Surgical History:  Procedure Laterality Date  . ABDOMINAL HYSTERECTOMY    . APPENDECTOMY    . CHOLECYSTECTOMY    . ESOPHAGOGASTRODUODENOSCOPY (EGD) WITH PROPOFOL N/A 08/31/2016   Procedure: ESOPHAGOGASTRODUODENOSCOPY (EGD) WITH PROPOFOL;  Surgeon: Jonathon Bellows, MD;  Location: [facility] ENDOSCOPY;  Service: Endoscopy;  Laterality: N/A;    OB History    None      Home Medications    Prior to Admission medications   Medication Sig Start Date End Date Taking? Authorizing Provider  atorvastatin (LIPITOR) 20 MG tablet Take by mouth. 07/04/17 07/04/18 Yes [provider]  Calcium Carb-Ergocalciferol 250-125 MG-UNIT TABS Take by mouth.   Yes [provider]  cetirizine (ZYRTEC) 10 MG tablet Take by mouth.   Yes [provider]  citalopram (CELEXA) 20 MG tablet  07/04/17  Yes [provider]  clonazePAM (KLONOPIN) 0.5 MG tablet Take by mouth. 05/06/13  Yes [provider]  diclofenac (VOLTAREN) 50 MG EC tablet  08/20/16  Yes [provider]  dicyclomine (BENTYL) 10 MG capsule TAKE (1) CAPSULE BY MOUTH FOUR TIMES A DAY BEFORE MEALS AND AT BEDTIME 06/27/17  Yes Jonathon Bellows, MD  DOCOSAHEXAENOIC ACID PO Take by mouth.   Yes [provider]  DOCUSATE CALCIUM PO Take by mouth.   Yes [provider]  docusate sodium (COLACE) 100 MG capsule Take 100 mg by mouth 2 (two) times daily.   Yes [provider]  ferrous sulfate 325 (65 FE) MG EC tablet One po qd for restless legs 07/04/17  Yes [provider]  fluticasone (FLONASE) 50 MCG/ACT nasal spray Place 2 sprays into both nostrils daily.   Yes [provider]  lovastatin (MEVACOR) 10 MG tablet Take 10 mg by mouth at bedtime.   Yes [provider]  Magnesium 250  MG TABS Take 1 tablet by mouth daily.   Yes [provider]  neomycin-polymyxin b-dexamethasone (MAXITROL) 3.5-10000-0.1 SUSP  06/21/17  Yes [provider]  Omega-3 Fatty Acids (FISH OIL) 1000 MG CAPS Take 1 capsule by mouth daily.   Yes [provider]  pantoprazole (PROTONIX) 20 MG tablet Take 2 tablets (40 mg total) by mouth daily. 12/12/17 03/12/18 Yes Jonathon Bellows, MD  polyethylene glycol powder [facility]) powder Take 1 Container by mouth once.   Yes [provider]  rOPINIRole (REQUIP) 0.5 MG tablet Take 0.5  mg by mouth at bedtime.   Yes [provider]  simethicone (MYLICON) 086 MG chewable tablet Chew 375 mg by mouth every 6 (six) hours as needed for flatulence.   Yes [provider]  SUMAtriptan (IMITREX) 50 MG tablet Take 50 mg by mouth every 2 (two) hours as needed for migraine. May repeat in 2 hours if headache persists or recurs.   Yes [provider]  SYMBICORT 160-4.5 MCG/ACT inhaler  07/13/15  Yes [provider]  traZODone (DESYREL) 50 MG tablet Take 50 mg by mouth at bedtime.   Yes [provider]  vitamin B-12 (CYANOCOBALAMIN) 1000 MCG tablet Take by mouth.   Yes [provider]  HYDROcodone-acetaminophen (NORCO) 5-325 MG tablet Take 1-2 tablets by mouth every 4 (four) hours as needed for moderate pain. 08/16/15   Beers, Pierce Crane, PA-C  ipratropium (ATROVENT) 0.06 % nasal spray  06/28/16   [provider]  lubiprostone (AMITIZA) 24 MCG capsule Take 1 capsule (24 mcg total) 2 (two) times daily with a meal by mouth. 08/06/17   Jonathon Bellows, MD  nitrofurantoin, macrocrystal-monohydrate, (MACROBID) 100 MG capsule Take 1 capsule (100 mg total) by mouth 2 (two) times daily. 12/24/17   Lorin Picket, PA-C    Family History Family History  Problem Relation Age of Onset  . Heart disease Father   . Breast cancer Neg Hx     Social History Social History   Tobacco Use  . Smoking status: Never Smoker  . Smokeless tobacco: Never Used  Substance Use Topics  . Alcohol use: No  . Drug use: No     Allergies   Penicillins   Review of Systems Review of Systems  Constitutional: Positive for activity change. Negative for chills, fatigue and fever.  Genitourinary: Positive for dysuria, hematuria and urgency. Negative for vaginal bleeding, vaginal discharge and vaginal pain.  All other systems reviewed and are negative.    Physical Exam Triage Vital Signs ED Triage Vitals  Enc Vitals Group     BP 12/24/17 1027 122/75      Pulse Rate 12/24/17 1027 68     Resp 12/24/17 1027 16     Temp 12/24/17 1027 98.4 F (36.9 C)     Temp Source 12/24/17 1027 Oral     SpO2 12/24/17 1027 99 %     Weight 12/24/17 1024 114 lb (51.7 kg)     Height 12/24/17 1024 5\' 4"  (1.626 m)     Head Circumference --      Peak Flow --      Pain Score 12/24/17 1024 4     Pain Loc --      Pain Edu? --      Excl. in [place]? --    No data found.  Updated Vital Signs BP 122/75 (BP Location: Left Arm)   Pulse 68   Temp 98.4 F (36.9 C) (Oral)   Resp 16  Ht 5\' 4"  (1.626 m)   Wt 114 lb (51.7 kg)   SpO2 99%   BMI 19.57 kg/m   Visual Acuity Right Eye Distance:   Left Eye Distance:   Bilateral Distance:    Right Eye Near:   Left Eye Near:    Bilateral Near:     Physical Exam  Constitutional: She is oriented to person, place, and time. She appears well-developed and well-nourished. No distress.  HENT:  Head: Normocephalic.  Right Ear: External ear normal.  Left Ear: External ear normal.  Nose: Nose normal.  Mouth/Throat: Oropharynx is clear and moist.  Eyes: Pupils are equal, round, and reactive to light. Right eye exhibits no discharge. Left eye exhibits no discharge.  Neck: Normal range of motion.  Pulmonary/Chest: Effort normal and breath sounds normal.  Abdominal: Soft. Bowel sounds are normal. She exhibits no distension and no mass. There is no tenderness. There is no rebound and no guarding.  Musculoskeletal: Normal range of motion.  Neurological: She is alert and oriented to person, place, and time.  Skin: Skin is warm and dry. She is not diaphoretic.  Psychiatric: She has a normal mood and affect. Her behavior is normal. Judgment and thought content normal.  Nursing note and vitals reviewed.    UC Treatments / Results  Labs (all labs ordered are listed, but only abnormal results are displayed) Labs Reviewed  URINALYSIS, COMPLETE (UACMP) WITH MICROSCOPIC - Abnormal; Notable for the following components:       Result Value   APPearance HAZY (*)    Specific Gravity, Urine <1.005 (*)    Hgb urine dipstick LARGE (*)    Leukocytes, UA LARGE (*)    Bacteria, UA RARE (*)    All other components within normal limits  URINE CULTURE    EKG None Radiology No results found.  Procedures Procedures (including critical care time)  Medications Ordered in UC Medications - No data to display   Initial Impression / Assessment and Plan / UC Course  I have reviewed the triage vital signs and the nursing notes.  Pertinent labs & imaging results that were available during my care of the patient were reviewed by me and considered in my medical decision making (see chart for details).     Plan: 1. Test/x-ray results and diagnosis reviewed with patient 2. rx as per orders; risks, benefits, potential side effects reviewed with patient 3. Recommend supportive treatment with increased fluids.  May use Azo over-the-counter for a day or 2 and then discontinue.  Cultures& sensitivities will be available in 48 hours. 4. F/u prn if symptoms worsen or don't improve   Final Clinical Impressions(s) / UC Diagnoses   Final diagnoses:  Lower urinary tract infectious disease    ED Discharge Orders        Ordered    nitrofurantoin, macrocrystal-monohydrate, (MACROBID) 100 MG capsule  2 times daily     12/24/17 1053       Controlled Substance Prescriptions [place] Controlled Substance Registry consulted? Not Applicable   Lorin Picket, PA-C 12/24/17 1103

## 2017-12-24 NOTE — ED Triage Notes (Signed)
Patient c/o burning when urinating, and some blood in her urine that started on Friday.

## 2017-12-25 LAB — URINE CULTURE: Culture: <10000 — AB

## 2017-12-26 ENCOUNTER — Telehealth: Payer: Self-pay | Admitting: Gastroenterology

## 2017-12-26 NOTE — Telephone Encounter (Signed)
Pt needs refill on rx dicyclomine 10 mg to [facility] in [place] 120 capsule. Please call pt cb [phone]

## 2017-12-27 ENCOUNTER — Other Ambulatory Visit: Payer: Self-pay

## 2017-12-27 MED ORDER — DICYCLOMINE HCL 10 MG PO CAPS: ORAL_CAPSULE | ORAL | 0 refills | Status: DC

## 2018-01-25 ENCOUNTER — Other Ambulatory Visit: Payer: Self-pay | Admitting: Family Medicine

## 2018-01-25 DIAGNOSIS — Z780 Asymptomatic menopausal state: Secondary | ICD-10-CM

## 2018-01-29 ENCOUNTER — Ambulatory Visit
Admission: RE | Admit: 2018-01-29 | Discharge: 2018-01-29 | Disposition: A | Discharge status: Home / Self Care | Payer: Medicare Other | Source: Ambulatory Visit | Attending: Family Medicine | Admitting: Family Medicine

## 2018-01-29 DIAGNOSIS — Z780 Asymptomatic menopausal state: Secondary | ICD-10-CM | POA: Insufficient documentation

## 2018-01-29 DIAGNOSIS — M8589 Other specified disorders of bone density and structure, multiple sites: Secondary | ICD-10-CM | POA: Insufficient documentation

## 2018-01-29 DIAGNOSIS — R739 Hyperglycemia, unspecified: Secondary | ICD-10-CM | POA: Diagnosis present

## 2018-02-01 ENCOUNTER — Encounter: Payer: Self-pay | Admitting: Gastroenterology

## 2018-02-01 ENCOUNTER — Ambulatory Visit (INDEPENDENT_AMBULATORY_CARE_PROVIDER_SITE_OTHER): Payer: Medicare Other | Admitting: Gastroenterology

## 2018-02-01 ENCOUNTER — Other Ambulatory Visit: Payer: Self-pay

## 2018-02-01 VITALS — BP 139/69 | HR 83 | Ht 64.0 in | Wt 116.4 lb

## 2018-02-01 DIAGNOSIS — K640 First degree hemorrhoids: Secondary | ICD-10-CM | POA: Diagnosis not present

## 2018-02-01 NOTE — Patient Instructions (Signed)

## 2018-02-01 NOTE — Progress Notes (Signed)
Cephas Darby, MD [address]  [place]  Abigail Powers, [place] [address]  Main: [phone]  Fax: [phone]    Gastroenterology Consultation  Referring Provider:     Gayland Curry, MD Primary Care Physician:  Gayland Curry, MD Primary Gastroenterologist:  Dr. Vicente Males Reason for Consultation:     Symptomatic hemorrhoids        HPI:   Abigail Powers is a 77 y.o. female referred by Dr.Kiran Vicente Males  for consultation & management of symptomatic hemorrhoids. She has history of chronic constipation for which she takes MiraLAX. She tried Amitiza suggested by Dr. Vicente Males, but did not notice any benefit greater than MiraLAX. She reports having 4 soft bowel movements daily. She reports that she has been having symptoms of perianal itching, rectal pain and rectal pressure. She tried Anusol suppository with no relief. She is referred by her primary care doctor to discuss about hemorrhoidal ligation. She has been taking dicyclomine for abdominal cramps. Her weight has been stable.  NSAIDs: as needed  Antiplts/Anticoagulants/Anti thrombotics: none  GI Procedures:  EGD 08/31/2016 - Normal examined duodenum. - Normal esophagus. - Gastritis. Biopsied. DIAGNOSIS:  A. STOMACH, ANTRUM; COLD BIOPSY:  - ANTRAL MUCOSA WITH REACTIVE GASTROPATHY.  - NEGATIVE FOR H. PYLORI, DYSPLASIA, AND MALIGNANCY.  She reports having had a colonoscopy about 10 years ago, reportedly normal Procedure report not available   Past Medical History:  Diagnosis Date  . Asthma   . COPD (chronic obstructive pulmonary disease) ([place])   . GERD (gastroesophageal reflux disease)   . Hyperlipidemia     Past Surgical History:  Procedure Laterality Date  . ABDOMINAL HYSTERECTOMY    . APPENDECTOMY    . CHOLECYSTECTOMY    . ESOPHAGOGASTRODUODENOSCOPY (EGD) WITH PROPOFOL N/A 08/31/2016   Procedure: ESOPHAGOGASTRODUODENOSCOPY (EGD) WITH PROPOFOL;  Surgeon: Jonathon Bellows, MD;  Location: [facility] ENDOSCOPY;  Service:  Endoscopy;  Laterality: N/A;     Current Outpatient Medications:  .  albuterol (PROVENTIL HFA) 108 (90 Base) MCG/ACT inhaler, Inhale into the lungs., Disp: , Rfl:  .  atorvastatin (LIPITOR) 20 MG tablet, Take by mouth., Disp: , Rfl:  .  Calcium Carb-Ergocalciferol 250-125 MG-UNIT TABS, Take by mouth., Disp: , Rfl:  .  cetirizine (ZYRTEC) 10 MG tablet, Take by mouth., Disp: , Rfl:  .  diclofenac (VOLTAREN) 50 MG EC tablet, , Disp: , Rfl:  .  dicyclomine (BENTYL) 10 MG capsule, TAKE (1) CAPSULE BY MOUTH FOUR TIMES A DAY BEFORE MEALS AND AT BEDTIME, Disp: 120 capsule, Rfl: 0 .  escitalopram (LEXAPRO) 20 MG tablet, Take by mouth., Disp: , Rfl:  .  fluticasone (FLONASE) 50 MCG/ACT nasal spray, Place 2 sprays into both nostrils daily., Disp: , Rfl:  .  hydrocortisone (ANUSOL-HC) 2.5 % rectal cream, , Disp: , Rfl:  .  ipratropium (ATROVENT) 0.06 % nasal spray, , Disp: , Rfl:  .  Omega-3 Fatty Acids (FISH OIL) 1000 MG CAPS, Take 1 capsule by mouth daily., Disp: , Rfl:  .  pantoprazole (PROTONIX) 20 MG tablet, Take 2 tablets (40 mg total) by mouth daily., Disp: 180 tablet, Rfl: 0 .  polyethylene glycol powder (MIRALAX) powder, Take 1 Container by mouth once., Disp: , Rfl:  .  rOPINIRole (REQUIP) 0.5 MG tablet, Take 0.5 mg by mouth at bedtime., Disp: , Rfl:  .  SYMBICORT 160-4.5 MCG/ACT inhaler, , Disp: , Rfl:  .  traZODone (DESYREL) 50 MG tablet, Take 50 mg by mouth at bedtime., Disp: , Rfl:  .  VENTOLIN HFA  108 (90 Base) MCG/ACT inhaler, , Disp: , Rfl:  .  vitamin B-12 (CYANOCOBALAMIN) 1000 MCG tablet, Take by mouth., Disp: , Rfl:  .  clonazePAM (KLONOPIN) 0.5 MG tablet, Take by mouth., Disp: , Rfl:  .  HYDROcodone-acetaminophen (NORCO) 5-325 MG tablet, Take 1-2 tablets by mouth every 4 (four) hours as needed for moderate pain. (Patient not taking: Reported on 02/01/2018), Disp: 15 tablet, Rfl: 0 .  lubiprostone (AMITIZA) 24 MCG capsule, Take 1 capsule (24 mcg total) 2 (two) times daily with a meal by  mouth. (Patient not taking: Reported on 02/01/2018), Disp: 60 capsule, Rfl: 3 .  Magnesium 250 MG TABS, Take 1 tablet by mouth daily., Disp: , Rfl:  .  neomycin-polymyxin b-dexamethasone (MAXITROL) 3.5-10000-0.1 SUSP, , Disp: , Rfl:  .  simethicone (MYLICON) 767 MG chewable tablet, Chew 375 mg by mouth every 6 (six) hours as needed for flatulence., Disp: , Rfl:  .  SUMAtriptan (IMITREX) 50 MG tablet, Take 50 mg by mouth every 2 (two) hours as needed for migraine. May repeat in 2 hours if headache persists or recurs., Disp: , Rfl:    Family History  Problem Relation Age of Onset  . Heart disease Father   . Breast cancer Neg Hx      Social History   Tobacco Use  . Smoking status: Never Smoker  . Smokeless tobacco: Never Used  Substance Use Topics  . Alcohol use: No  . Drug use: No    Allergies as of 02/01/2018 - Review Complete 02/01/2018  Allergen Reaction Noted  . Penicillins Rash 08/16/2015    Review of Systems:    All systems reviewed and negative except where noted in HPI.   Physical Exam:  BP 139/69   Pulse 83   Ht 5\' 4"  (1.626 m)   Wt 116 lb 6.4 oz (52.8 kg)   BMI 19.98 kg/m  No LMP recorded. Patient has had a hysterectomy.  General:   Alert,  Thin built,  pleasant and cooperative in NAD Head:  Normocephalic and atraumatic. Eyes:  Sclera clear, no icterus.   Conjunctiva pink. Ears:  Normal auditory acuity. Nose:  No deformity, discharge, or lesions. Mouth:  No deformity or lesions,oropharynx pink & moist. Neck:  Supple; no masses or thyromegaly. Lungs:  Respirations even and unlabored.  Clear throughout to auscultation.   No wheezes, crackles, or rhonchi. No acute distress. Heart:  Regular rate and rhythm; no murmurs, clicks, rubs, or gallops. Abdomen:  Normal bowel sounds. Soft, non-tender and non-distended without masses, hepatosplenomegaly or hernias noted.  No guarding or rebound tenderness.   Rectal: circumferential skin tags, left lateral external  hemorrhoid, tender.  Msk:  Symmetrical without gross deformities. Good, equal movement & strength bilaterally. Pulses:  Normal pulses noted. Extremities:  No clubbing or edema.  No cyanosis. Neurologic:  Alert and oriented x3;  grossly normal neurologically. Skin:  Intact without significant lesions or rashes. No jaundice. Lymph Nodes:  No significant cervical adenopathy. Psych:  Alert and cooperative. Normal mood and affect.  Imaging Studies: reviewed  Assessment and Plan:   Breea Loncar is a 77 y.o. female with symptomatic external hemorrhoids. It appears that patient does not have cirrhosis or chronic liver disease.  - Discussed with her about hemorrhoid ligation today and she is agreeable - recommend fiber supplements   Follow up in 2 weeks   Cephas Darby, MD

## 2018-02-01 NOTE — Progress Notes (Signed)
PROCEDURE NOTE: The patient presents with symptomatic grade 1 hemorrhoids, unresponsive to maximal medical therapy, requesting rubber band ligation of his/her hemorrhoidal disease.  All risks, benefits and alternative forms of therapy were described and informed consent was obtained.  The decision was made to band the RA internal hemorrhoid, and the [facility] O'Regan System was used to perform band ligation without complication.  Digital anorectal examination was then performed to assure proper positioning of the band, and to adjust the banded tissue as required.  The patient was discharged home without pain or other issues.  Dietary and behavioral recommendations were given and (if necessary - prescriptions were given), along with follow-up instructions.  The patient will return 2 weeks for follow-up and possible additional banding as required.  No complications were encountered and the patient tolerated the procedure well.  Rohini R Vanga, MD [address]  [address]  [place], [place] [address]  Main: [phone]  Fax: [phone] Pager: [phone]   

## 2018-02-05 ENCOUNTER — Telehealth: Payer: Self-pay | Admitting: Gastroenterology

## 2018-02-05 NOTE — Telephone Encounter (Signed)
PT HAD APT LAST WEEK SHE WAS TOLD TO TAKE FIBER POWDER AND SHE WOULD LIKE TO KNOW HOW MANY TIMES A DAY SHE SHOULD PUT IT ON HER FOOD  OK TOI LEAVE MESSAGE ON ANSWERING MACHINE CB [phone]

## 2018-02-07 NOTE — Telephone Encounter (Signed)
LVM to let patient know that she may take her fiber powder 1-2 times a day, and to call office back if she has any additional questions.  Thanks [employer]

## 2018-02-14 ENCOUNTER — Other Ambulatory Visit: Payer: Self-pay

## 2018-02-14 ENCOUNTER — Ambulatory Visit (INDEPENDENT_AMBULATORY_CARE_PROVIDER_SITE_OTHER): Payer: Medicare Other | Admitting: Gastroenterology

## 2018-02-14 ENCOUNTER — Encounter: Payer: Self-pay | Admitting: Gastroenterology

## 2018-02-14 VITALS — BP 120/62 | HR 70 | Resp 16 | Ht 64.0 in | Wt 119.0 lb

## 2018-02-14 DIAGNOSIS — K640 First degree hemorrhoids: Secondary | ICD-10-CM | POA: Diagnosis not present

## 2018-02-14 NOTE — Progress Notes (Signed)
PROCEDURE NOTE: The patient presents with symptomatic grade 1 hemorrhoids, unresponsive to maximal medical therapy, requesting rubber band ligation of his/her hemorrhoidal disease.  All risks, benefits and alternative forms of therapy were described and informed consent was obtained.  The decision was made to band the RP internal hemorrhoid, and the [facility] O'Regan System was used to perform band ligation without complication.  Digital anorectal examination was then performed to assure proper positioning of the band, and to adjust the banded tissue as required.  The patient was discharged home without pain or other issues.  Dietary and behavioral recommendations were given and (if necessary - prescriptions were given), along with follow-up instructions.  The patient will return 2 weeks for follow-up and possible additional banding as required.  No complications were encountered and the patient tolerated the procedure well.  Rohini R Vanga, MD [address]  [address]  [place], [place] [address]  Main: [phone]  Fax: [phone] Pager: [phone]  

## 2018-02-28 ENCOUNTER — Ambulatory Visit: Payer: 59 | Admitting: Gastroenterology

## 2018-03-05 DIAGNOSIS — M519 Unspecified thoracic, thoracolumbar and lumbosacral intervertebral disc disorder: Secondary | ICD-10-CM | POA: Insufficient documentation

## 2018-03-08 ENCOUNTER — Ambulatory Visit (INDEPENDENT_AMBULATORY_CARE_PROVIDER_SITE_OTHER): Payer: Medicare Other | Admitting: Gastroenterology

## 2018-03-08 ENCOUNTER — Encounter: Payer: Self-pay | Admitting: Gastroenterology

## 2018-03-08 VITALS — BP 124/56 | HR 71 | Temp 97.5°F | Ht 64.0 in | Wt 118.0 lb

## 2018-03-08 DIAGNOSIS — K640 First degree hemorrhoids: Secondary | ICD-10-CM | POA: Diagnosis not present

## 2018-03-08 NOTE — Progress Notes (Signed)
PROCEDURE NOTE: The patient presents with symptomatic grade 1 hemorrhoids, unresponsive to maximal medical therapy, requesting rubber band ligation of his/her hemorrhoidal disease.  All risks, benefits and alternative forms of therapy were described and informed consent was obtained.  The decision was made to band the LL internal hemorrhoid, and the [facility] O'Regan System was used to perform band ligation without complication.  Digital anorectal examination was then performed to assure proper positioning of the band, and to adjust the banded tissue as required.  The patient was discharged home without pain or other issues.  Dietary and behavioral recommendations were given and (if necessary - prescriptions were given), along with follow-up instructions.  The patient will return  as needed for follow-up and possible additional banding as required.  No complications were encountered and the patient tolerated the procedure well.  Mabell Esguerra R Cathryn Gallery, MD [address]  [address]  [place], [place] [address]  Main: [phone]  Fax: [phone] Pager: [phone]   

## 2018-05-16 ENCOUNTER — Telehealth: Payer: Self-pay | Admitting: Gastroenterology

## 2018-05-16 ENCOUNTER — Other Ambulatory Visit: Payer: Self-pay

## 2018-05-16 MED ORDER — DICYCLOMINE HCL 10 MG PO CAPS: ORAL_CAPSULE | ORAL | 0 refills | Status: DC

## 2018-05-16 NOTE — Telephone Encounter (Signed)
Medication has been refilled and sent to [facility]

## 2018-05-16 NOTE — Telephone Encounter (Signed)
Patient LVM wanting to know if you can call a RX for Dicyclomine 10mg  into [facility]. Please call and let her know

## 2018-07-03 ENCOUNTER — Telehealth: Payer: Self-pay | Admitting: Gastroenterology

## 2018-07-03 NOTE — Telephone Encounter (Signed)
Patient called & would like to know what type of shot she had for her Liver. Dr Vicente Males  was the first one that gave it to her & she wants to discuss her liver with Dr Vicente Males.

## 2018-07-04 ENCOUNTER — Telehealth: Payer: Self-pay | Admitting: Gastroenterology

## 2018-07-04 NOTE — Telephone Encounter (Signed)
Spoke with pt and informed her that Dr. Vicente Males says she is okay to take the Bupropion 75 mg as directed.

## 2018-07-04 NOTE — Telephone Encounter (Signed)
Pt is calling her Doctor  prescripted her Rx Bupropion 75 mg  2 times a day  and she wants to check with Dr. Vicente Males first because the rx states t to check with Dr. If history of cirrhosis or Liver problems cb [phone] Please call pt

## 2018-07-04 NOTE — Telephone Encounter (Signed)
It is ok to take

## 2018-07-10 ENCOUNTER — Other Ambulatory Visit: Payer: Self-pay | Admitting: Internal Medicine

## 2018-07-10 DIAGNOSIS — Z1231 Encounter for screening mammogram for malignant neoplasm of breast: Secondary | ICD-10-CM

## 2018-07-11 IMAGING — MR MR HEAD W/O CM
10 series · 40 of 48 positions shown · non-contrast
Comparison: CT head 10/28/2010

CLINICAL DATA: Mild cognitive impairment

EXAM:
MRI HEAD WITHOUT CONTRAST
TECHNIQUE: Multiplanar, multiecho pulse sequences of the brain and surrounding
structures were obtained without intravenous contrast.

[Series 2: T1 · sagittal · 5.0mm · 0.47mm/px · 1 of 22 slices shown (1 of 2)]
[im 1/22]
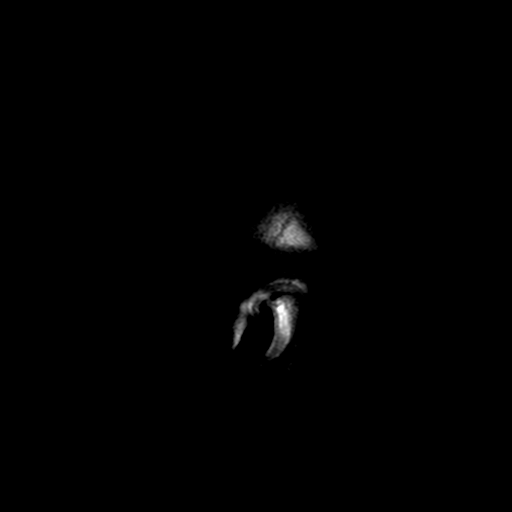

[Series 4: DWI · axial · 3.0mm · 0.94mm/px · z∈[-28,+122]mm · 5 of 52 slices shown (1 of 2)]
[im 1/52]
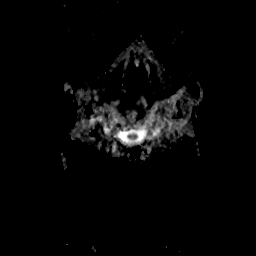
[im 13/52]
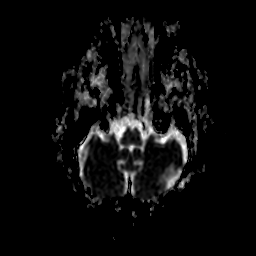
[im 26/52]
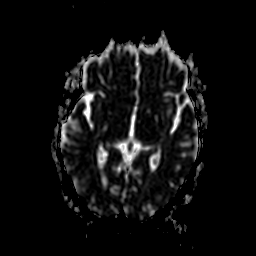
[im 39/52]
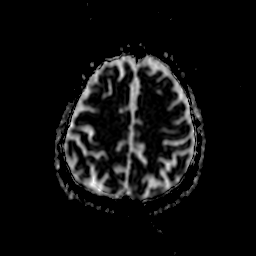
[im 52/52]
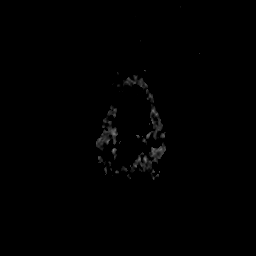

[Series 6: DWI · coronal · 5.0mm · 1.80mm/px · 4 of 39 slices shown (2 of 2)]
[im 1/39]
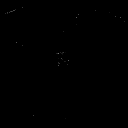
[im 13/39]
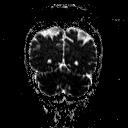
[im 26/39]
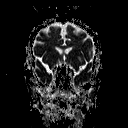
[im 39/39]
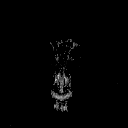

[Series 7: T2 · axial · 5.0mm · 0.45mm/px · z∈[-25,+126]mm · 2 of 23 slices shown (1 of 3)]
[im 1/23]
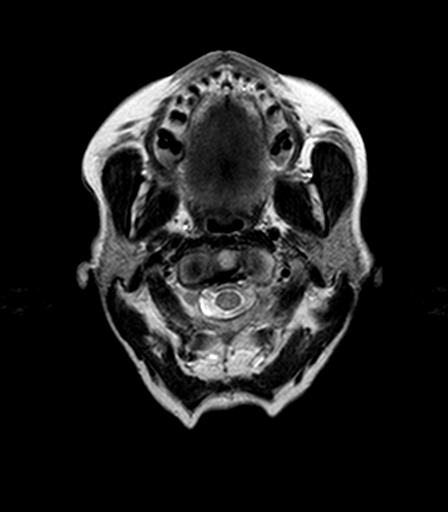
[im 23/23]
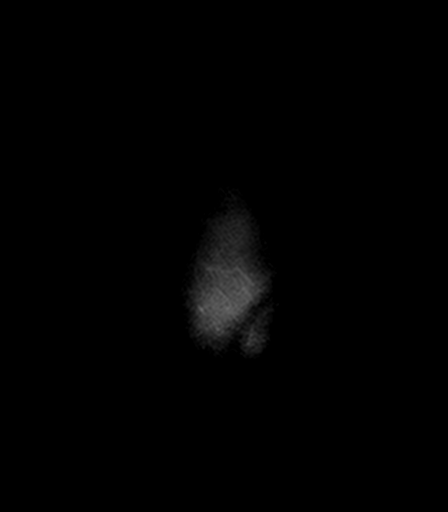

[Series 8: ax (id) · axial · 3.0mm · 0.94mm/px · z∈[-28,+122]mm · 5 of 52 slices shown]
[im 1/52]
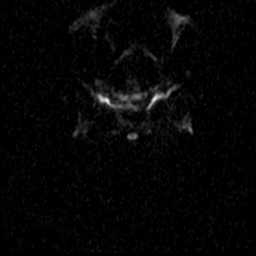
[im 13/52]
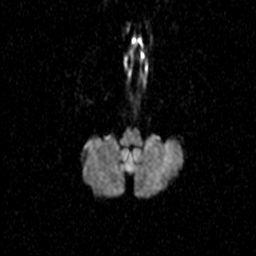
[im 26/52]
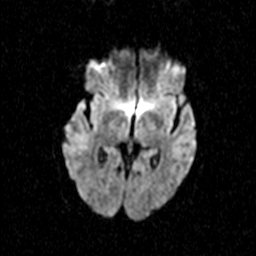
[im 39/52]
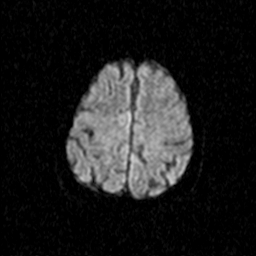
[im 52/52]
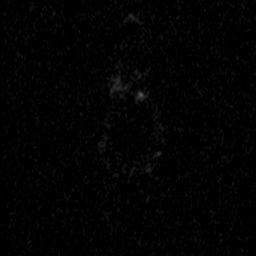

[Series 9: cor (id) · coronal · 5.0mm · 1.80mm/px · 4 of 36 slices shown]
[im 1/36]
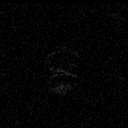
[im 12/36]
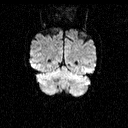
[im 24/36]
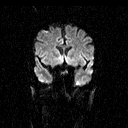
[im 36/36]
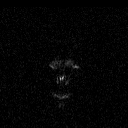

[Series 10: FLAIR · axial · 3.0mm · 0.90mm/px · z∈[-21,+123]mm · 5 of 50 slices shown]
[im 1/50]
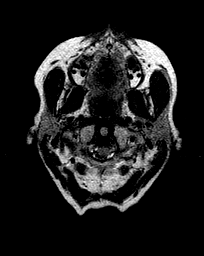
[im 13/50]
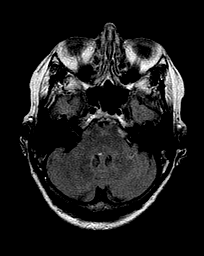
[im 25/50]
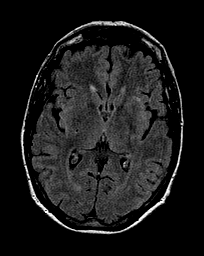
[im 37/50]
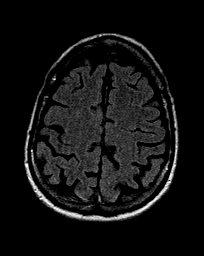
[im 50/50]
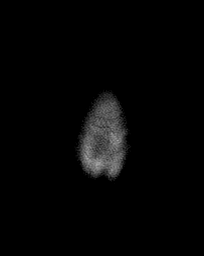

[Series 11: T2 · axial · 5.0mm · 0.45mm/px · z∈[-26,+126]mm · 3 of 27 slices shown (2 of 3)]
[im 1/27]
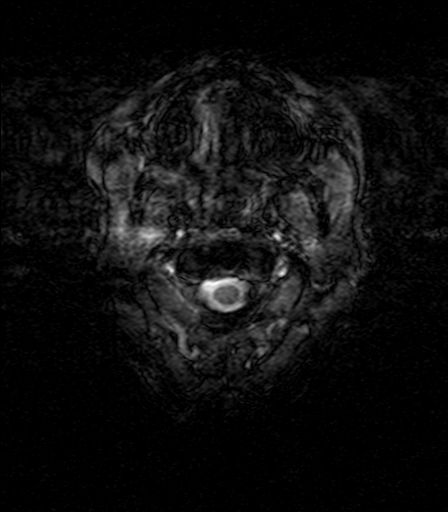
[im 14/27]
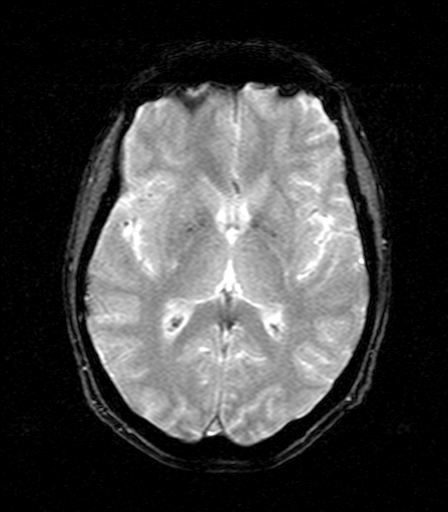
[im 27/27]
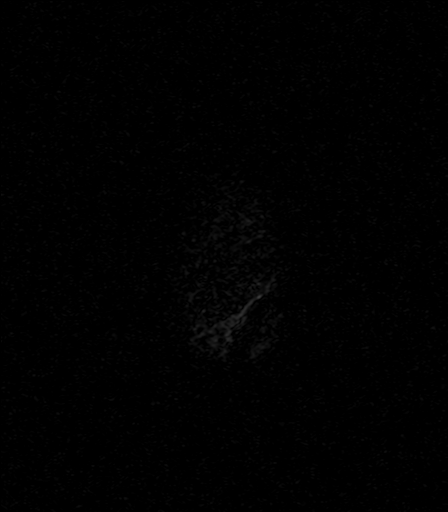

[Series 12: T1 · axial · 1.0mm · 0.45mm/px · z∈[-19,+127]mm · 8 of 160 slices shown (2 of 2)]
[im 11/160]
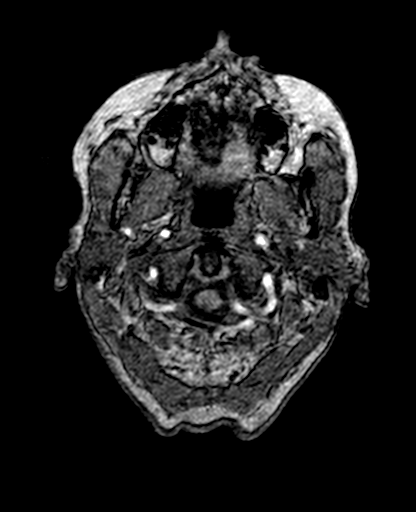
[im 32/160]
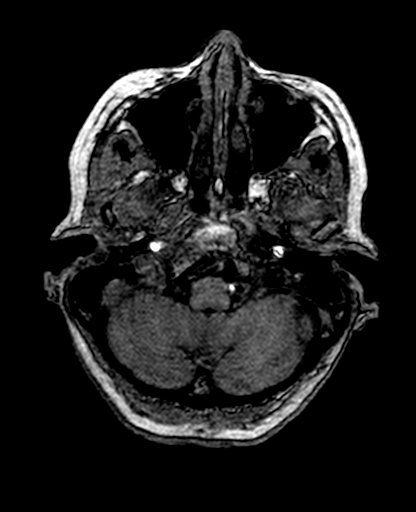
[im 54/160]
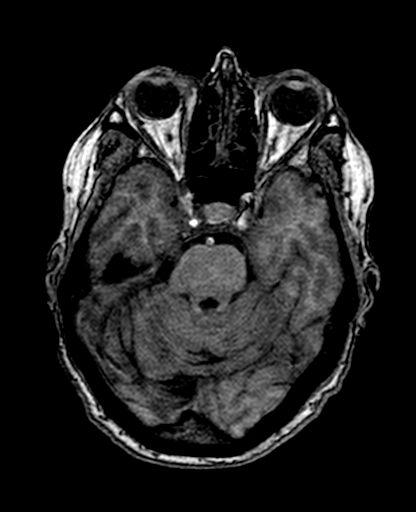
[im 75/160]
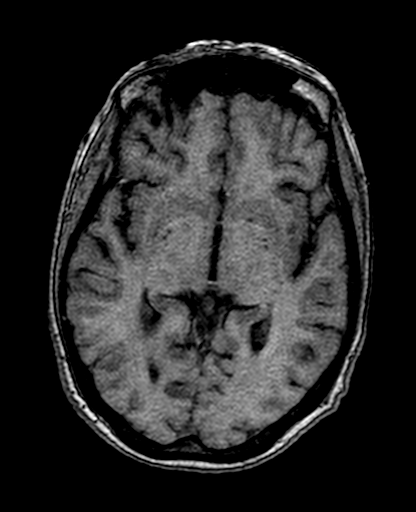
[im 96/160]
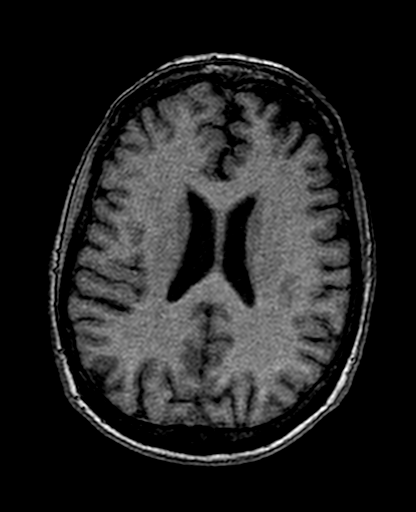
[im 117/160]
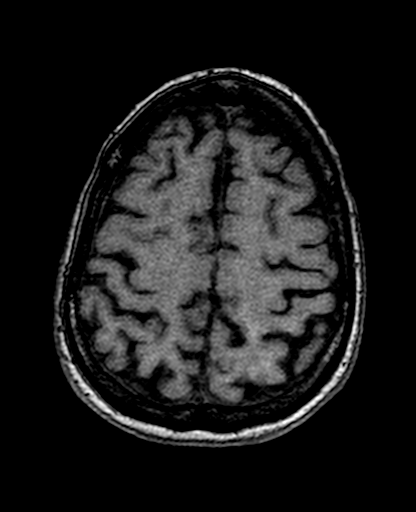
[im 138/160]
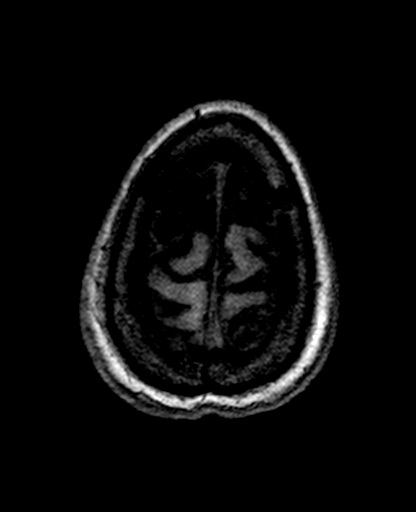
[im 160/160]
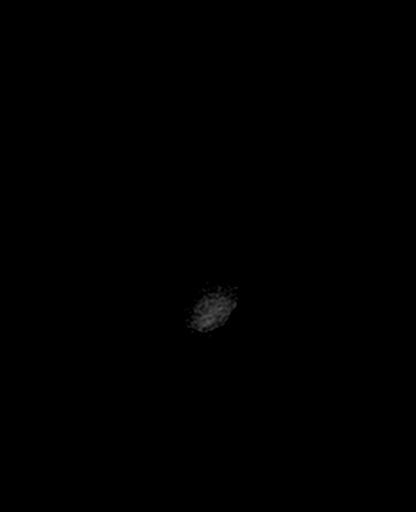

[Series 13: T2 · coronal · 5.0mm · 0.45mm/px · 3 of 28 slices shown (3 of 3)]
[im 1/28]
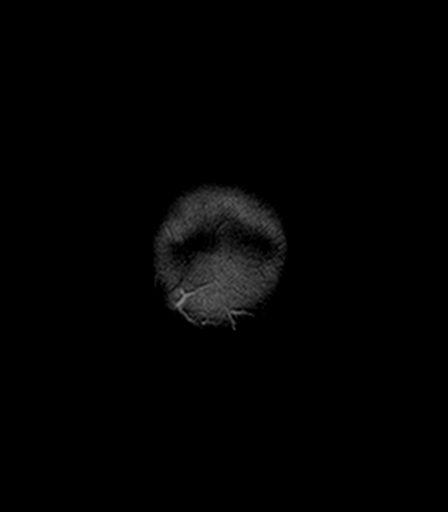
[im 14/28]
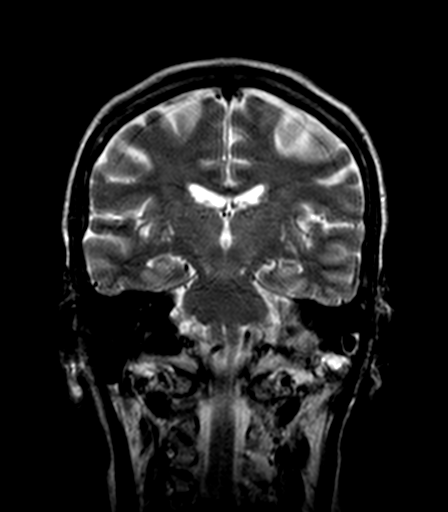
[im 28/28]
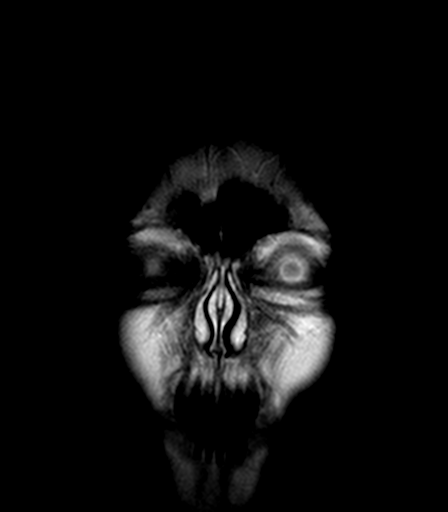

[40 of 48 positions shown; findings below may reference images not displayed]

FINDINGS: Brain: Ventricle size normal.  Cerebral volume within normal limits.

Negative for acute infarct. Small hyperintensities in the subinsular
white matter on the left. Brainstem and basal ganglia normal.
Negative for hemorrhage or mass lesion.

Vascular: Normal arterial flow void

Skull and upper cervical spine: Negative

Sinuses/Orbits: Mild mucosal edema paranasal sinuses. Left mastoid
effusion. Normal orbit.

Other: None
IMPRESSION: Cerebral volume normal for age. Mild chronic ischemic change. No
acute abnormality

Left mastoid sinus effusion.

## 2018-07-15 ENCOUNTER — Encounter: Payer: Self-pay | Admitting: Podiatry

## 2018-07-15 ENCOUNTER — Ambulatory Visit (INDEPENDENT_AMBULATORY_CARE_PROVIDER_SITE_OTHER): Payer: Medicare Other

## 2018-07-15 ENCOUNTER — Ambulatory Visit (INDEPENDENT_AMBULATORY_CARE_PROVIDER_SITE_OTHER): Payer: Medicare Other | Admitting: Podiatry

## 2018-07-15 DIAGNOSIS — M779 Enthesopathy, unspecified: Secondary | ICD-10-CM

## 2018-07-15 DIAGNOSIS — M778 Other enthesopathies, not elsewhere classified: Secondary | ICD-10-CM

## 2018-07-15 NOTE — Progress Notes (Signed)
Subjective:  Patient ID: Abigail Powers, female    DOB: 09-21-1941,  MRN: [id number] HPI Chief Complaint  Patient presents with  . Foot Pain    Patient presents today for left foot/1st mpj joint pain x years.  She reports the pain is getting progressivly getting worse   She states "its achy, but comes and goes"  She has been using hemp cream and lidocaine cream which gives some relief    77 y.o. female presents with the above complaint.   ROS: Denies fever chills nausea vomiting muscle aches pains calf pain back pain chest pain shortness of breath.  Past Medical History:  Diagnosis Date  . Asthma   . COPD (chronic obstructive pulmonary disease) ([place])   . GERD (gastroesophageal reflux disease)   . Hyperlipidemia    Past Surgical History:  Procedure Laterality Date  . ABDOMINAL HYSTERECTOMY    . APPENDECTOMY    . CHOLECYSTECTOMY    . ESOPHAGOGASTRODUODENOSCOPY (EGD) WITH PROPOFOL N/A 08/31/2016   Procedure: ESOPHAGOGASTRODUODENOSCOPY (EGD) WITH PROPOFOL;  Surgeon: Jonathon Bellows, MD;  Location: [facility] ENDOSCOPY;  Service: Endoscopy;  Laterality: N/A;    Current Outpatient Medications:  .  buPROPion (WELLBUTRIN) 75 MG tablet, Take by mouth., Disp: , Rfl:  .  albuterol (PROVENTIL HFA) 108 (90 Base) MCG/ACT inhaler, Inhale into the lungs., Disp: , Rfl:  .  atorvastatin (LIPITOR) 20 MG tablet, Take by mouth., Disp: , Rfl:  .  buPROPion (WELLBUTRIN) 75 MG tablet, , Disp: , Rfl:  .  Calcium Carb-Ergocalciferol 250-125 MG-UNIT TABS, Take by mouth., Disp: , Rfl:  .  cetirizine (ZYRTEC) 10 MG tablet, Take by mouth., Disp: , Rfl:  .  citalopram (CELEXA) 20 MG tablet, , Disp: , Rfl:  .  clonazePAM (KLONOPIN) 0.5 MG tablet, Take by mouth., Disp: , Rfl:  .  diazepam (VALIUM) 2 MG tablet, 1-2 tab po 30 minutes before ESI, Disp: , Rfl:  .  diclofenac (VOLTAREN) 50 MG EC tablet, , Disp: , Rfl:  .  dicyclomine (BENTYL) 10 MG capsule, TAKE (1) CAPSULE BY MOUTH FOUR TIMES A DAY BEFORE MEALS AND  AT BEDTIME, Disp: 120 capsule, Rfl: 0 .  escitalopram (LEXAPRO) 20 MG tablet, Take by mouth., Disp: , Rfl:  .  fluticasone (FLONASE) 50 MCG/ACT nasal spray, Place 2 sprays into both nostrils daily., Disp: , Rfl:  .  HYDROcodone-acetaminophen (NORCO) 5-325 MG tablet, Take 1-2 tablets by mouth every 4 (four) hours as needed for moderate pain., Disp: 15 tablet, Rfl: 0 .  hydrocortisone (ANUSOL-HC) 2.5 % rectal cream, , Disp: , Rfl:  .  hydrocortisone 2.5 % cream, , Disp: , Rfl:  .  ipratropium (ATROVENT) 0.06 % nasal spray, , Disp: , Rfl:  .  lubiprostone (AMITIZA) 24 MCG capsule, Take 1 capsule (24 mcg total) 2 (two) times daily with a meal by mouth. (Patient not taking: Reported on 03/08/2018), Disp: 60 capsule, Rfl: 3 .  Magnesium 250 MG TABS, Take 1 tablet by mouth daily., Disp: , Rfl:  .  neomycin-polymyxin b-dexamethasone (MAXITROL) 3.5-10000-0.1 SUSP, , Disp: , Rfl:  .  Omega-3 Fatty Acids (FISH OIL) 1000 MG CAPS, Take 1 capsule by mouth daily., Disp: , Rfl:  .  pantoprazole (PROTONIX) 20 MG tablet, Take 2 tablets (40 mg total) by mouth daily., Disp: 180 tablet, Rfl: 0 .  polyethylene glycol powder (MIRALAX) powder, Take 1 Container by mouth once., Disp: , Rfl:  .  Psyllium (SM FIBER) 48.57 % POWD, Take by mouth., Disp: , Rfl:  .  rOPINIRole (REQUIP) 0.5 MG tablet, Take 0.5 mg by mouth at bedtime., Disp: , Rfl:  .  simethicone (MYLICON) 829 MG chewable tablet, Chew 375 mg by mouth every 6 (six) hours as needed for flatulence., Disp: , Rfl:  .  SUMAtriptan (IMITREX) 50 MG tablet, Take 50 mg by mouth every 2 (two) hours as needed for migraine. May repeat in 2 hours if headache persists or recurs., Disp: , Rfl:  .  SYMBICORT 160-4.5 MCG/ACT inhaler, , Disp: , Rfl:  .  traZODone (DESYREL) 50 MG tablet, Take 50 mg by mouth at bedtime., Disp: , Rfl:  .  VENTOLIN HFA 108 (90 Base) MCG/ACT inhaler, , Disp: , Rfl:  .  vitamin B-12 (CYANOCOBALAMIN) 1000 MCG tablet, Take by mouth., Disp: , Rfl:    Allergies  Allergen Reactions  . Penicillins Rash   Review of Systems Objective:  There were no vitals filed for this visit.  General: Well developed, nourished, in no acute distress, alert and oriented x3   Dermatological: Skin is warm, dry and supple bilateral. Nails x 10 are well maintained; remaining integument appears unremarkable at this time. There are no open sores, no preulcerative lesions, no rash or signs of infection present.  Vascular: Dorsalis Pedis artery and Posterior Tibial artery pedal pulses are 2/4 bilateral with immedate capillary fill time. Pedal hair growth present. No varicosities and no lower extremity edema present bilateral.   Neruologic: Grossly intact via light touch bilateral. Vibratory intact via tuning fork bilateral. Protective threshold with Semmes Wienstein monofilament intact to all pedal sites bilateral. Patellar and Achilles deep tendon reflexes 2+ bilateral. No Babinski or clonus noted bilateral.   Musculoskeletal: No gross boney pedal deformities bilateral. No pain, crepitus, or limitation noted with foot and ankle range of motion bilateral. Muscular strength 5/5 in all groups tested bilateral.  Pain on palpation and range of motion first metatarsophalangeal joint left foot.  No erythema cellulitis drainage or odor mild edema.  Mild tenderness on distraction of the joint.  Gait: Unassisted, Nonantalgic.    Radiographs:  Radiographs taken today demonstrate a tight first metatarsophalangeal joint with dorsal spurring joint space narrowing subchondral sclerosis and eburnation.  Hallux interphalangeal is also noted.  Swelling around the soft tissue of the first metatarsophalangeal joint.  No acute injuries.  Assessment & Plan:   Assessment: Capsulitis first metatarsophalangeal joint with hallux limitus left.    Plan: After sterile Betadine skin prep I injected 15 mg of Kenalog 5 mg of Marcaine first metatarsophalangeal joint of the right foot.   Tolerated procedure well without complications.  Discussed appropriate shoe gear stretching exercise ice therapy and possible need for surgery.     Cher Egnor T. [place], [place]

## 2018-08-02 ENCOUNTER — Encounter: Payer: Self-pay | Admitting: Emergency Medicine

## 2018-08-02 ENCOUNTER — Ambulatory Visit
Admission: EM | Admit: 2018-08-02 | Discharge: 2018-08-02 | Disposition: A | Discharge status: Home / Self Care | Payer: Medicare Other | Attending: Family Medicine | Admitting: Family Medicine

## 2018-08-02 ENCOUNTER — Other Ambulatory Visit: Payer: Self-pay

## 2018-08-02 DIAGNOSIS — J449 Chronic obstructive pulmonary disease, unspecified: Secondary | ICD-10-CM | POA: Insufficient documentation

## 2018-08-02 DIAGNOSIS — E785 Hyperlipidemia, unspecified: Secondary | ICD-10-CM | POA: Insufficient documentation

## 2018-08-02 DIAGNOSIS — R300 Dysuria: Secondary | ICD-10-CM | POA: Diagnosis present

## 2018-08-02 DIAGNOSIS — Z880 Allergy status to penicillin: Secondary | ICD-10-CM | POA: Insufficient documentation

## 2018-08-02 DIAGNOSIS — F329 Major depressive disorder, single episode, unspecified: Secondary | ICD-10-CM | POA: Insufficient documentation

## 2018-08-02 DIAGNOSIS — Z79899 Other long term (current) drug therapy: Secondary | ICD-10-CM | POA: Insufficient documentation

## 2018-08-02 DIAGNOSIS — K7469 Other cirrhosis of liver: Secondary | ICD-10-CM | POA: Diagnosis not present

## 2018-08-02 DIAGNOSIS — K219 Gastro-esophageal reflux disease without esophagitis: Secondary | ICD-10-CM | POA: Diagnosis not present

## 2018-08-02 DIAGNOSIS — F419 Anxiety disorder, unspecified: Secondary | ICD-10-CM | POA: Diagnosis not present

## 2018-08-02 LAB — URINALYSIS, COMPLETE (UACMP) WITH MICROSCOPIC
BACTERIA UA: NONE SEEN
Glucose, UA: NEGATIVE mg/dL
Nitrite: NEGATIVE
PH: 5 (ref 5.0–8.0)
PROTEIN: 100 mg/dL — AB
RBC / HPF: >50 RBC/hpf (ref 0–5)
Specific Gravity, Urine: >1.03 — ABNORMAL HIGH (ref 1.005–1.030)

## 2018-08-02 MED ORDER — NITROFURANTOIN MONOHYD MACRO 100 MG PO CAPS: 100.0000 mg | ORAL_CAPSULE | Freq: Two times a day (BID) | ORAL | 0 refills | Status: DC

## 2018-08-02 NOTE — Discharge Instructions (Signed)
Take medication as prescribed. Rest. Drink plenty of fluids.  ° °Follow up with your primary care physician this week as needed. Return to Urgent care for new or worsening concerns.  ° °

## 2018-08-02 NOTE — ED Provider Notes (Signed)
[facility] ____________________________________________  Time seen: Approximately 2:26 PM  I have reviewed the triage vital signs and the nursing notes.   HISTORY  Chief Complaint Dysuria   HPI Abigail Powers is a 77 y.o. female presenting for evaluation of 2 days of urinary frequency, urinary urgency and burning with urination.  States of burning with urination increased earlier today.  Some suprapubic pressure sensation, denies abdominal pain.  Denies accompanying back pain, fevers, vomiting, diarrhea, vaginal discomfort, chest pain or shortness of breath or other complaints.  Reports otherwise doing well.  States similar to previous UTIs.  No recent antibiotic use.  Denies renal insufficiency.  Continues to overall eat and drink well.  No over-the-counter medication taken for the same complaint.  Reports otherwise doing well denies other complaints.  Gayland Curry, MD: PCP   Past Medical History:  Diagnosis Date  . Asthma   . COPD (chronic obstructive pulmonary disease) ([place])   . GERD (gastroesophageal reflux disease)   . Hyperlipidemia     Patient Active Problem List   Diagnosis Date Noted  . Lumbar disc disease 03/05/2018  . Other cirrhosis of liver ([place]) 12/12/2016  . Abdominal pain, epigastric   . Abnormal CT of the abdomen 08/23/2016  . Constipation 08/23/2016  . Abnormal weight loss 08/23/2016  . Digital mucous cyst of finger of left hand 12/31/2015  . COPD, mild ([place]) 12/06/2015  . Allergic rhinitis 08/30/2015  . Anxiety 08/30/2015  . Arthritis 08/30/2015  . Depression 08/30/2015  . Acid reflux 08/30/2015  . Hyperglycemia, unspecified 08/30/2015  . Hyperlipidemia, unspecified 08/30/2015  . Cannot sleep 08/30/2015  . Headache, migraine 08/30/2015  . OP (osteoporosis) 08/30/2015  . Abdominal cramping 08/30/2015  . 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 Bronchiectasis ([place]) 03/23/2014  . Restless leg 02/03/2013  .  Avitaminosis D 04/03/2012    Past Surgical History:  Procedure Laterality Date  . ABDOMINAL HYSTERECTOMY    . APPENDECTOMY    . CHOLECYSTECTOMY    . ESOPHAGOGASTRODUODENOSCOPY (EGD) WITH PROPOFOL N/A 08/31/2016   Procedure: ESOPHAGOGASTRODUODENOSCOPY (EGD) WITH PROPOFOL;  Surgeon: Jonathon Bellows, MD;  Location: [facility];  Service: Endoscopy;  Laterality: N/A;     No current facility-administered medications for this encounter.   Current Outpatient Medications:  .  albuterol (PROVENTIL HFA) 108 (90 Base) MCG/ACT inhaler, Inhale into the lungs., Disp: , Rfl:  .  atorvastatin (LIPITOR) 20 MG tablet, Take by mouth., Disp: , Rfl:  .  buPROPion (WELLBUTRIN) 75 MG tablet, Take by mouth., Disp: , Rfl:  .  Calcium Carb-Ergocalciferol 250-125 MG-UNIT TABS, Take by mouth., Disp: , Rfl:  .  cetirizine (ZYRTEC) 10 MG tablet, Take by mouth., Disp: , Rfl:  .  citalopram (CELEXA) 20 MG tablet, , Disp: , Rfl:  .  diclofenac (VOLTAREN) 50 MG EC tablet, , Disp: , Rfl:  .  pantoprazole (PROTONIX) 20 MG tablet, Take 2 tablets (40 mg total) by mouth daily., Disp: 180 tablet, Rfl: 0 .  polyethylene glycol powder (MIRALAX) powder, Take 1 Container by mouth once., Disp: , Rfl:  .  Psyllium (SM FIBER) 48.57 % POWD, Take by mouth., Disp: , Rfl:  .  rOPINIRole (REQUIP) 0.5 MG tablet, Take 0.5 mg by mouth at bedtime., Disp: , Rfl:  .  simethicone (MYLICON) 010 MG chewable tablet, Chew 375 mg by mouth every 6 (six) hours as needed for flatulence., Disp: , Rfl:  .  SUMAtriptan (IMITREX) 50 MG tablet, Take 50 mg by mouth every 2 (two) hours as needed  for migraine. May repeat in 2 hours if headache persists or recurs., Disp: , Rfl:  .  traZODone (DESYREL) 50 MG tablet, Take 50 mg by mouth at bedtime., Disp: , Rfl:  .  VENTOLIN HFA 108 (90 Base) MCG/ACT inhaler, , Disp: , Rfl:  .  vitamin B-12 (CYANOCOBALAMIN) 1000 MCG tablet, Take by mouth., Disp: , Rfl:  .  buPROPion (WELLBUTRIN) 75 MG tablet, , Disp: , Rfl:  .   clonazePAM (KLONOPIN) 0.5 MG tablet, Take by mouth., Disp: , Rfl:  .  dicyclomine (BENTYL) 10 MG capsule, TAKE (1) CAPSULE BY MOUTH FOUR TIMES A DAY BEFORE MEALS AND AT BEDTIME, Disp: 120 capsule, Rfl: 0 .  escitalopram (LEXAPRO) 20 MG tablet, Take by mouth., Disp: , Rfl:  .  HYDROcodone-acetaminophen (NORCO) 5-325 MG tablet, Take 1-2 tablets by mouth every 4 (four) hours as needed for moderate pain., Disp: 15 tablet, Rfl: 0 .  hydrocortisone (ANUSOL-HC) 2.5 % rectal cream, , Disp: , Rfl:  .  hydrocortisone 2.5 % cream, , Disp: , Rfl:  .  nitrofurantoin, macrocrystal-monohydrate, (MACROBID) 100 MG capsule, Take 1 capsule (100 mg total) by mouth 2 (two) times daily., Disp: 10 capsule, Rfl: 0  Allergies Penicillins  Family History  Problem Relation Age of Onset  . Heart disease Father   . Breast cancer Neg Hx     Social History Social History   Tobacco Use  . Smoking status: Never Smoker  . Smokeless tobacco: Never Used  Substance Use Topics  . Alcohol use: No  . Drug use: No    Review of Systems Constitutional: No known fever Cardiovascular: Denies chest pain. Respiratory: Denies shortness of breath. Gastrointestinal: No abdominal pain.  No nausea, no vomiting.  No diarrhea.   Genitourinary: positive for dysuria. Musculoskeletal: Negative for back pain. Skin: Negative for rash.   ____________________________________________   PHYSICAL EXAM:  VITAL SIGNS: ED Triage Vitals  Enc Vitals Group     BP 08/02/18 1323 124/62     Pulse Rate 08/02/18 1323 80     Resp 08/02/18 1323 16     Temp 08/02/18 1323 98.6 F (37 C)     Temp Source 08/02/18 1323 Oral     SpO2 08/02/18 1323 96 %     Weight 08/02/18 1321 114 lb (51.7 kg)     Height 08/02/18 1321 5\' 4"  (1.626 m)     Head Circumference --      Peak Flow --      Pain Score 08/02/18 1321 6     Pain Loc --      Pain Edu? --      Excl. in [place]? --     Constitutional: Alert and oriented. Well appearing and in no acute  distress. ENT      Head: Normocephalic and atraumatic. Cardiovascular: Normal rate, regular rhythm. Grossly normal heart sounds.  Good peripheral circulation. Respiratory: Normal respiratory effort without tachypnea nor retractions. Breath sounds are clear and equal bilaterally. No wheezes, rales, rhonchi. Gastrointestinal: Soft and nontender. No CVA tenderness. Musculoskeletal:  No midline cervical, thoracic or lumbar tenderness to palpation. Neurologic:  Normal speech and language. Speech is normal. No gait instability.  Skin:  Skin is warm, dry and intact. No rash noted. Psychiatric: Mood and affect are normal. Speech and behavior are normal. Patient exhibits appropriate insight and judgment   ___________________________________________   LABS (all labs ordered are listed, but only abnormal results are displayed)  Labs Reviewed  URINALYSIS, COMPLETE (UACMP) WITH MICROSCOPIC - Abnormal;  Notable for the following components:      Result Value   APPearance CLOUDY (*)    Specific Gravity, Urine >1.030 (*)    Hgb urine dipstick LARGE (*)    Bilirubin Urine SMALL (*)    Ketones, ur TRACE (*)    Protein, ur 100 (*)    Leukocytes, UA SMALL (*)    All other components within normal limits  URINE CULTURE    PROCEDURES Procedures    INITIAL IMPRESSION / ASSESSMENT AND PLAN / ED COURSE  Pertinent labs & imaging results that were available during my care of the patient were reviewed by me and considered in my medical decision making (see chart for details).  Appearing patient.  No acute distress.  Presented for evaluation of dysuria.  Urinalysis reviewed, discussed with patient not clear UTI though suggestive of UTI.  Small quantity given.  We will culture urine and empirically start on oral Macrobid.  Patient states she is allergic to penicillin with significant rash history.  Encourage rest, fluids, supportive care.  Discussed strict follow-up and return parameters.Discussed  indication, risks and benefits of medications with patient.  Discussed follow up with Primary care physician this week as needed. Discussed follow up and return parameters including no resolution or any worsening concerns. Patient verbalized understanding and agreed to plan.   ____________________________________________   FINAL CLINICAL IMPRESSION(S) / ED DIAGNOSES  Final diagnoses:  Dysuria     ED Discharge Orders         Ordered    nitrofurantoin, macrocrystal-monohydrate, (MACROBID) 100 MG capsule  2 times daily     08/02/18 1410           Note: This dictation was prepared with Dragon dictation along with smaller phrase technology. Any transcriptional errors that result from this process are unintentional.         Marylene Land, NP 08/02/18 1431

## 2018-08-02 NOTE — ED Triage Notes (Signed)
Pt c/o urinary frequency, and dysuria. Started last night. No fever, lower back pain or pelvic pain.

## 2018-08-03 LAB — URINE CULTURE: Culture: 50000 — AB

## 2018-08-19 ENCOUNTER — Encounter (INDEPENDENT_AMBULATORY_CARE_PROVIDER_SITE_OTHER): Payer: Self-pay

## 2018-08-19 ENCOUNTER — Ambulatory Visit
Admission: RE | Admit: 2018-08-19 | Discharge: 2018-08-19 | Disposition: A | Discharge status: Home / Self Care | Payer: Medicare Other | Source: Ambulatory Visit | Attending: Internal Medicine | Admitting: Internal Medicine

## 2018-08-19 DIAGNOSIS — Z1231 Encounter for screening mammogram for malignant neoplasm of breast: Secondary | ICD-10-CM | POA: Insufficient documentation

## 2018-10-11 ENCOUNTER — Ambulatory Visit (INDEPENDENT_AMBULATORY_CARE_PROVIDER_SITE_OTHER): Payer: Medicare Other | Admitting: Nurse Practitioner

## 2018-10-11 ENCOUNTER — Encounter (INDEPENDENT_AMBULATORY_CARE_PROVIDER_SITE_OTHER): Payer: Self-pay | Admitting: Nurse Practitioner

## 2018-10-11 VITALS — BP 150/69 | HR 67 | Resp 14 | Ht 64.0 in | Wt 111.6 lb

## 2018-10-11 DIAGNOSIS — I83813 Varicose veins of bilateral lower extremities with pain: Secondary | ICD-10-CM | POA: Diagnosis not present

## 2018-10-11 DIAGNOSIS — M79605 Pain in left leg: Secondary | ICD-10-CM

## 2018-10-11 DIAGNOSIS — M79604 Pain in right leg: Secondary | ICD-10-CM | POA: Diagnosis not present

## 2018-10-11 DIAGNOSIS — E785 Hyperlipidemia, unspecified: Secondary | ICD-10-CM

## 2018-10-11 NOTE — Progress Notes (Signed)
Subjective:    Patient ID: Abigail Powers, female    DOB: May 10, 1941, 78 y.o.   MRN: [id number] Chief Complaint  Patient presents with  . Establish Care    HPI  Abigail Powers is a 78 y.o. female that was referred by Dr. Sabra Heck for symptomatic varicose veins.  Abigail Powers has recently started to have an aching pain in her anterior thigh area.  It also sometimes radiates down to her shin with the right leg being worse than the left.  There is also an area with varicose veins that seems to be particularly painful.  She states that at times there are sharp stabbing pains and sometimes her legs feel achy and heavy.  The patient does have a history of restless leg syndrome as well as spinal stenosis.  The patient denies any fever, chills, nausea, vomiting or diarrhea.  She denies any chest pain or shortness of breath.  She denies any prior vascular interventions.  Past Medical History:  Diagnosis Date  . Asthma   . COPD (chronic obstructive pulmonary disease) ([place])   . GERD (gastroesophageal reflux disease)   . Hyperlipidemia     Past Surgical History:  Procedure Laterality Date  . ABDOMINAL HYSTERECTOMY    . APPENDECTOMY    . CHOLECYSTECTOMY    . ESOPHAGOGASTRODUODENOSCOPY (EGD) WITH PROPOFOL N/A 08/31/2016   Procedure: ESOPHAGOGASTRODUODENOSCOPY (EGD) WITH PROPOFOL;  Surgeon: Jonathon Bellows, MD;  Location: [facility] ENDOSCOPY;  Service: Endoscopy;  Laterality: N/A;    Social History   Socioeconomic History  . Marital status: Married    Spouse name: Not on file  . Number of children: Not on file  . Years of education: Not on file  . Highest education level: Not on file  Occupational History  . Not on file  Social Needs  . Financial resource strain: Not on file  . Food insecurity:    Worry: Not on file    Inability: Not on file  . Transportation needs:    Medical: Not on file    Non-medical: Not on file  Tobacco Use  . Smoking status: Never Smoker  . Smokeless  tobacco: Never Used  Substance and Sexual Activity  . Alcohol use: No  . Drug use: No  . Sexual activity: Not Currently  Lifestyle  . Physical activity:    Days per week: Not on file    Minutes per session: Not on file  . Stress: Not on file  Relationships  . Social connections:    Talks on phone: Not on file    Gets together: Not on file    Attends religious service: Not on file    Active member of club or organization: Not on file    Attends meetings of clubs or organizations: Not on file    Relationship status: Not on file  . Intimate partner violence:    Fear of current or ex partner: Not on file    Emotionally abused: Not on file    Physically abused: Not on file    Forced sexual activity: Not on file  Other Topics Concern  . Not on file  Social History Narrative  . Not on file    Family History  Problem Relation Age of Onset  . Heart disease Father   . Breast cancer Neg Hx     Allergies  Allergen Reactions  . Penicillins Rash     Review of Systems   Review of Systems: Negative Unless Checked Constitutional: [] Weight loss  []   Fever  [] Chills Cardiac: [] Chest pain   []  Atrial Fibrillation  [] Palpitations   [] Shortness of breath when laying flat   [] Shortness of breath with exertion. [] Shortness of breath at rest Vascular:  [] Pain in legs with walking   [] Pain in legs with standing [] Pain in legs when laying flat   [] Claudication    [] Pain in feet when laying flat    [] History of DVT   [] Phlebitis   [] Swelling in legs   [] Varicose veins   [] Non-healing ulcers Pulmonary:   [] Uses home oxygen   [] Productive cough   [] Hemoptysis   [] Wheeze  [x] COPD   [] Asthma Neurologic:  [] Dizziness   [] Seizures  [] Blackouts [] History of stroke   [] History of TIA  [] Aphasia   [] Temporary Blindness   [] Weakness or numbness in arm   [] Weakness or numbness in leg Musculoskeletal:   [] Joint swelling   [] Joint pain   [] Low back pain  []  History of Knee Replacement [] Arthritis [] back  Surgeries  []  Spinal Stenosis    Hematologic:  [] Easy bruising  [] Easy bleeding   [] Hypercoagulable state   [] Anemic Gastrointestinal:  [] Diarrhea   [] Vomiting  [] Gastroesophageal reflux/heartburn   [] Difficulty swallowing. [] Abdominal pain Genitourinary:  [] Chronic kidney disease   [] Difficult urination  [] Anuric   [] Blood in urine [] Frequent urination  [] Burning with urination   [] Hematuria Skin:  [] Rashes   [] Ulcers [] Wounds Psychological:  [x] History of anxiety   [x]  History of major depression  []  Memory Difficulties     Objective:   Physical Exam  BP (!) 150/69 (BP Location: Right Arm, Patient Position: Sitting, Cuff Size: Normal)   Pulse 67   Resp 14   Ht 5\' 4"  (1.626 m)   Wt 111 lb 9.6 oz (50.6 kg)   BMI 19.16 kg/m   Gen: WD/WN, NAD Head: [place]/AT, No temporalis wasting.  Ear/Nose/Throat: Hearing grossly intact, nares w/o erythema or drainage Eyes: PER, EOMI, sclera nonicteric.  Neck: Supple, no masses.  No JVD.  Pulmonary:  Good air movement, no use of accessory muscles.  Cardiac: RRR Vascular: scattered varicosities, some 2-71mm in size Vessel Right Left  Radial Palpable Palpable  Dorsalis Pedis Palpable Palpable  Posterior Tibial Palpable Palpable   Gastrointestinal: soft, non-distended. No guarding/no peritoneal signs.  Musculoskeletal: M/S 5/5 throughout.  No deformity or atrophy.  Neurologic: Pain and light touch intact in extremities.  Symmetrical.  Speech is fluent. Motor exam as listed above. Psychiatric: Judgment intact, Mood & affect appropriate for pt's clinical situation. Dermatologic: No Venous rashes. No Ulcers Noted.  No changes consistent with cellulitis. Lymph : No Cervical lymphadenopathy, no lichenification or skin changes of chronic lymphedema.      Assessment & Plan:   1. Leg pain, bilateral  Recommend:  The patient has atypical pain symptoms for pure atherosclerotic disease. However, on physical exam there is evidence of mixed venous and  arterial disease, given the diminished pulses and the edema associated with venous changes of the legs.  Noninvasive studies including ABI's and venous ultrasound of the legs will be obtained and the patient will follow up with me to review these studies.  The patient should continue walking and begin a more formal exercise program. The patient should continue his antiplatelet therapy and aggressive treatment of the lipid abnormalities.  The patient should begin wearing graduated compression socks 15-20 mmHg strength to control edema.  - VAS [place] ABI WITH/WO TBI; Future  2. Varicose veins of both lower extremities with pain  Recommend:  The patient has symptomatic varicose  veins that are painful and associated with swelling.  I have had a long discussion with the patient regarding  varicose veins and why they cause symptoms.  Patient will begin wearing graduated compression stockings class 1 on a daily basis, beginning first thing in the morning and removing them in the evening. The patient is instructed specifically not to sleep in the stockings.    The patient  will also begin using over-the-counter analgesics such as Motrin 600 mg po TID to help control the symptoms.    In addition, behavioral modification including elevation during the day will be initiated.      An  ultrasound of the venous system will be obtained.   Further plans will be based on the ultrasound results and whether conservative therapies are successful at eliminating the pain and swelling.  - VAS [place] LOWER EXTREMITY VENOUS REFLUX; Future  3. Hyperlipidemia, unspecified hyperlipidemia type Continue statin as ordered and reviewed, no changes at this time    Current Outpatient Medications on File Prior to Visit  Medication Sig Dispense Refill  . albuterol (PROVENTIL HFA) 108 (90 Base) MCG/ACT inhaler Inhale into the lungs.    . budesonide-formoterol (SYMBICORT) 160-4.5 MCG/ACT inhaler Inhale 2 puffs into the lungs 2  (two) times daily.    . Calcium Carb-Ergocalciferol 250-125 MG-UNIT TABS Take by mouth.    . cetirizine (ZYRTEC) 10 MG tablet Take by mouth.    . diclofenac (VOLTAREN) 50 MG EC tablet     . dicyclomine (BENTYL) 10 MG capsule TAKE (1) CAPSULE BY MOUTH FOUR TIMES A DAY BEFORE MEALS AND AT BEDTIME 120 capsule 0  . hydrocortisone 2.5 % cream     . pantoprazole (PROTONIX) 20 MG tablet Take 2 tablets (40 mg total) by mouth daily. 180 tablet 0  . polyethylene glycol powder (MIRALAX) powder Take 1 Container by mouth once.    . Psyllium (SM FIBER) 48.57 % POWD Take by mouth.    Marland Kitchen rOPINIRole (REQUIP) 0.5 MG tablet Take 0.5 mg by mouth at bedtime.    . SUMAtriptan (IMITREX) 50 MG tablet Take 50 mg by mouth every 2 (two) hours as needed for migraine. May repeat in 2 hours if headache persists or recurs.    . traZODone (DESYREL) 50 MG tablet Take 50 mg by mouth at bedtime.    . vitamin B-12 (CYANOCOBALAMIN) 1000 MCG tablet Take by mouth.    . escitalopram (LEXAPRO) 20 MG tablet Take by mouth.    . escitalopram (LEXAPRO) 5 MG tablet Take 5 mg by mouth daily.    Marland Kitchen etodolac (LODINE) 400 MG tablet Take 400 mg by mouth 2 (two) times daily.    Marland Kitchen HYDROcodone-acetaminophen (NORCO) 5-325 MG tablet Take 1-2 tablets by mouth every 4 (four) hours as needed for moderate pain. (Patient not taking: Reported on 10/11/2018) 15 tablet 0   No current facility-administered medications on file prior to visit.     There are no Patient Instructions on file for this visit. No follow-ups on file.   Kris Hartmann, NP  This note was completed with Sales executive.  Any errors are purely unintentional.

## 2018-10-18 ENCOUNTER — Ambulatory Visit (INDEPENDENT_AMBULATORY_CARE_PROVIDER_SITE_OTHER): Payer: Medicare Other

## 2018-10-18 ENCOUNTER — Encounter (INDEPENDENT_AMBULATORY_CARE_PROVIDER_SITE_OTHER): Payer: Self-pay | Admitting: Nurse Practitioner

## 2018-10-18 ENCOUNTER — Ambulatory Visit (INDEPENDENT_AMBULATORY_CARE_PROVIDER_SITE_OTHER): Payer: Medicare Other | Admitting: Nurse Practitioner

## 2018-10-18 VITALS — BP 123/63 | HR 64 | Resp 16 | Ht 64.0 in | Wt 111.8 lb

## 2018-10-18 DIAGNOSIS — E785 Hyperlipidemia, unspecified: Secondary | ICD-10-CM | POA: Diagnosis not present

## 2018-10-18 DIAGNOSIS — M79604 Pain in right leg: Secondary | ICD-10-CM | POA: Diagnosis not present

## 2018-10-18 DIAGNOSIS — M79605 Pain in left leg: Secondary | ICD-10-CM

## 2018-10-18 DIAGNOSIS — I83813 Varicose veins of bilateral lower extremities with pain: Secondary | ICD-10-CM

## 2018-10-24 DIAGNOSIS — Z0000 Encounter for general adult medical examination without abnormal findings: Secondary | ICD-10-CM | POA: Insufficient documentation

## 2018-10-25 ENCOUNTER — Encounter (INDEPENDENT_AMBULATORY_CARE_PROVIDER_SITE_OTHER): Payer: Self-pay | Admitting: Nurse Practitioner

## 2018-10-25 NOTE — Progress Notes (Signed)
Subjective:    Patient ID: Abigail Powers, female    DOB: 1941-05-04, 78 y.o.   MRN: [id number] Chief Complaint  Patient presents with  . Follow-up    HPI  Abigail Powers is a 78 y.o. female that returns for follow-up studies due to symptomatic varicose veins.  [name] has recently started having aching pain in her anterior thigh area.  It sometimes radiates down her shin and the right leg is worse than the left.  There is also an area near her right shin with varicose veins that tends to be particularly painful she states that she endorses sharp stabbing pain having achy heavy legs at times.  The patient also has a history of restless leg syndrome as well as spinal stenosis.  She denies any fever, chills, nausea, vomiting or diarrhea.  She denies any chest pain or shortness of breath.  Today she underwent bilateral ABIs which revealed an ABI of 1.22 on her right lower extremity and an ABI of 1.28 on her left.  She also had strong triphasic tibial artery waveforms bilaterally.  She also underwent a bilateral lower extremity venous reflux study which revealed no evidence of DVT, superficial venous thrombosis or chronic venous insufficiency.  Past Medical History:  Diagnosis Date  . Asthma   . COPD (chronic obstructive pulmonary disease) ([place])   . GERD (gastroesophageal reflux disease)   . Hyperlipidemia     Past Surgical History:  Procedure Laterality Date  . ABDOMINAL HYSTERECTOMY    . APPENDECTOMY    . CHOLECYSTECTOMY    . ESOPHAGOGASTRODUODENOSCOPY (EGD) WITH PROPOFOL N/A 08/31/2016   Procedure: ESOPHAGOGASTRODUODENOSCOPY (EGD) WITH PROPOFOL;  Surgeon: Jonathon Bellows, MD;  Location: [facility] ENDOSCOPY;  Service: Endoscopy;  Laterality: N/A;    Social History   Socioeconomic History  . Marital status: Married    Spouse name: Not on file  . Number of children: Not on file  . Years of education: Not on file  . Highest education level: Not on file  Occupational  History  . Not on file  Social Needs  . Financial resource strain: Not on file  . Food insecurity:    Worry: Not on file    Inability: Not on file  . Transportation needs:    Medical: Not on file    Non-medical: Not on file  Tobacco Use  . Smoking status: Never Smoker  . Smokeless tobacco: Never Used  Substance and Sexual Activity  . Alcohol use: No  . Drug use: No  . Sexual activity: Not Currently  Lifestyle  . Physical activity:    Days per week: Not on file    Minutes per session: Not on file  . Stress: Not on file  Relationships  . Social connections:    Talks on phone: Not on file    Gets together: Not on file    Attends religious service: Not on file    Active member of club or organization: Not on file    Attends meetings of clubs or organizations: Not on file    Relationship status: Not on file  . Intimate partner violence:    Fear of current or ex partner: Not on file    Emotionally abused: Not on file    Physically abused: Not on file    Forced sexual activity: Not on file  Other Topics Concern  . Not on file  Social History Narrative  . Not on file    Family History  Problem Relation Age  of Onset  . Heart disease Father   . Breast cancer Neg Hx     Allergies  Allergen Reactions  . Penicillins Rash     Review of Systems   Review of Systems: Negative Unless Checked Constitutional: [] Weight loss  [] Fever  [] Chills Cardiac: [] Chest pain   []  Atrial Fibrillation  [] Palpitations   [] Shortness of breath when laying flat   [] Shortness of breath with exertion. [] Shortness of breath at rest Vascular:  [] Pain in legs with walking   [x] Pain in legs with standing [] Pain in legs when laying flat   [] Claudication    [] Pain in feet when laying flat    [] History of DVT   [] Phlebitis   [] Swelling in legs   [x] Varicose veins   [] Non-healing ulcers Pulmonary:   [] Uses home oxygen   [] Productive cough   [] Hemoptysis   [] Wheeze  [x] COPD   [] Asthma Neurologic:   [] Dizziness   [] Seizures  [] Blackouts [] History of stroke   [] History of TIA  [] Aphasia   [] Temporary Blindness   [] Weakness or numbness in arm   [] Weakness or numbness in leg Musculoskeletal:   [] Joint swelling   [] Joint pain   [] Low back pain  []  History of Knee Replacement [] Arthritis [] back Surgeries  []  Spinal Stenosis    Hematologic:  [] Easy bruising  [] Easy bleeding   [] Hypercoagulable state   [] Anemic Gastrointestinal:  [] Diarrhea   [] Vomiting  [] Gastroesophageal reflux/heartburn   [] Difficulty swallowing. [] Abdominal pain Genitourinary:  [] Chronic kidney disease   [] Difficult urination  [] Anuric   [] Blood in urine [] Frequent urination  [] Burning with urination   [] Hematuria Skin:  [] Rashes   [] Ulcers [] Wounds Psychological:  [x] History of anxiety   [x]  History of major depression  []  Memory Difficulties     Objective:   Physical Exam  BP 123/63 (BP Location: Right Arm, Patient Position: Sitting)   Pulse 64   Resp 16   Ht 5\' 4"  (1.626 m)   Wt 111 lb 12.8 oz (50.7 kg)   BMI 19.19 kg/m   Gen: WD/WN, NAD Head: [place]/AT, No temporalis wasting.  Ear/Nose/Throat: Hearing grossly intact, nares w/o erythema or drainage Eyes: PER, EOMI, sclera nonicteric.  Neck: Supple, no masses.  No JVD.  Pulmonary:  Good air movement, no use of accessory muscles.  Cardiac: RRR Vascular:  Scattered varicosities, 2 to 3 mm in size Vessel Right Left  Radial Palpable Palpable  Dorsalis Pedis Palpable Palpable  Posterior Tibial Palpable Palpable   Gastrointestinal: soft, non-distended. No guarding/no peritoneal signs.  Musculoskeletal: M/S 5/5 throughout.  No deformity or atrophy.  Neurologic: Pain and light touch intact in extremities.  Symmetrical.  Speech is fluent. Motor exam as listed above. Psychiatric: Judgment intact, Mood & affect appropriate for pt's clinical situation. Dermatologic: No Venous rashes. No Ulcers Noted.  No changes consistent with cellulitis. Lymph : No Cervical  lymphadenopathy, no lichenification or skin changes of chronic lymphedema.      Assessment & Plan:   1. Varicose veins of both lower extremities with pain Recommend:  The patient is complaining of varicose veins.    I have had a long discussion with the patient regarding  varicose veins and why they cause symptoms.  Patient will begin wearing graduated compression stockings on a daily basis, beginning first thing in the morning and removing them in the evening. The patient is instructed specifically not to sleep in the stockings.    The patient  will also begin using over-the-counter analgesics such as Motrin 600 mg po TID to  help control the symptoms as needed.    In addition, behavioral modification including elevation during the day will be initiated, utilizing a recliner was recommended.  The patient is also instructed to continue exercising such as walking 4-5 times per week.  At this time the patient wishes to continue conservative therapy and is not interested in more invasive treatments such as laser ablation and sclerotherapy.  The Patient will follow up PRN if the symptoms worsen.  2. Leg pain, bilateral Recommend:  I do not find evidence of Vascular pathology that would explain the patient's symptoms  The patient has atypical pain symptoms for vascular disease  Noninvasive studies including venous ultrasound of the legs do not identify vascular problems  The patient should continue walking and begin a more formal exercise program. The patient should continue his antiplatelet therapy and aggressive treatment of the lipid abnormalities. The patient should begin wearing graduated compression socks 15-20 mmHg strength to control her mild edema.  Patient will follow-up with me on a PRN basis  Further work-up of her lower extremity pain is deferred to the primary service     3. Hyperlipidemia, unspecified hyperlipidemia type Continue statin as ordered and reviewed, no  changes at this time    Current Outpatient Medications on File Prior to Visit  Medication Sig Dispense Refill  . doxycycline (MONODOX) 100 MG capsule Take 100 mg by mouth 2 (two) times daily.    Marland Kitchen albuterol (PROVENTIL HFA) 108 (90 Base) MCG/ACT inhaler Inhale into the lungs.    . budesonide-formoterol (SYMBICORT) 160-4.5 MCG/ACT inhaler Inhale 2 puffs into the lungs 2 (two) times daily.    . Calcium Carb-Ergocalciferol 250-125 MG-UNIT TABS Take by mouth.    . cetirizine (ZYRTEC) 10 MG tablet Take by mouth.    . diclofenac (VOLTAREN) 50 MG EC tablet     . dicyclomine (BENTYL) 10 MG capsule TAKE (1) CAPSULE BY MOUTH FOUR TIMES A DAY BEFORE MEALS AND AT BEDTIME 120 capsule 0  . escitalopram (LEXAPRO) 20 MG tablet Take by mouth.    . escitalopram (LEXAPRO) 5 MG tablet Take 5 mg by mouth daily.    Marland Kitchen etodolac (LODINE) 400 MG tablet Take 400 mg by mouth 2 (two) times daily.    Marland Kitchen HYDROcodone-acetaminophen (NORCO) 5-325 MG tablet Take 1-2 tablets by mouth every 4 (four) hours as needed for moderate pain. (Patient not taking: Reported on 10/11/2018) 15 tablet 0  . hydrocortisone 2.5 % cream     . pantoprazole (PROTONIX) 20 MG tablet Take 2 tablets (40 mg total) by mouth daily. 180 tablet 0  . polyethylene glycol powder (MIRALAX) powder Take 1 Container by mouth once.    . Psyllium (SM FIBER) 48.57 % POWD Take by mouth.    Marland Kitchen rOPINIRole (REQUIP) 0.5 MG tablet Take 0.5 mg by mouth at bedtime.    . SUMAtriptan (IMITREX) 50 MG tablet Take 50 mg by mouth every 2 (two) hours as needed for migraine. May repeat in 2 hours if headache persists or recurs.    . traZODone (DESYREL) 50 MG tablet Take 50 mg by mouth at bedtime.    . vitamin B-12 (CYANOCOBALAMIN) 1000 MCG tablet Take by mouth.     No current facility-administered medications on file prior to visit.     There are no Patient Instructions on file for this visit. Return if symptoms worsen or fail to improve.   Kris Hartmann, NP  This note was  completed with Sales executive.  Any errors are purely  unintentional.

## 2018-11-05 ENCOUNTER — Telehealth: Payer: Self-pay | Admitting: Gastroenterology

## 2018-11-05 NOTE — Telephone Encounter (Signed)
I called patient to make her an appointment & she states that Dr Vicente Males prescribes this medication for her. I have sent a note to [place].

## 2018-11-05 NOTE — Telephone Encounter (Signed)
Patient called & L/M on V/M that she needs a refill on Dicyclomine HC1 10mg . Please call her at [phone].

## 2018-11-05 NOTE — Telephone Encounter (Signed)
Please call pt and ask her to schedule appt, pt has not been seen since June and will need to be seen in clinic    Thank you

## 2018-11-05 NOTE — Telephone Encounter (Signed)
Patient called & L/M on V/M that she needs a refill on Dicyclomine HC1 10mg . Please call her at [phone].       Melbeta please note I sent this same message to [facility] because patient see's both doctor's. When I called patient she states that Dr Vicente Males prescribes this medication for her. Please advise patient.

## 2018-11-11 ENCOUNTER — Encounter: Payer: Self-pay | Admitting: Gastroenterology

## 2018-11-11 ENCOUNTER — Other Ambulatory Visit: Payer: Self-pay

## 2018-11-11 ENCOUNTER — Ambulatory Visit (INDEPENDENT_AMBULATORY_CARE_PROVIDER_SITE_OTHER): Payer: Medicare Other | Admitting: Gastroenterology

## 2018-11-11 VITALS — BP 132/64 | HR 94 | Ht 64.0 in | Wt 111.2 lb

## 2018-11-11 DIAGNOSIS — K5900 Constipation, unspecified: Secondary | ICD-10-CM | POA: Diagnosis not present

## 2018-11-11 DIAGNOSIS — K7460 Unspecified cirrhosis of liver: Secondary | ICD-10-CM

## 2018-11-11 MED ORDER — DICYCLOMINE HCL 10 MG PO CAPS: ORAL_CAPSULE | ORAL | 0 refills | Status: DC

## 2018-11-11 NOTE — Progress Notes (Signed)
Jonathon Bellows MD, MRCP(U.K) [address]  [place]  [place], [place] [address]  Main: [phone]  Fax: [phone]   Primary Care Physician: Rusty Aus, MD  Primary Gastroenterologist:  Dr. Jonathon Bellows   No chief complaint on file.   HPI: Abigail Powers is a 78 y.o. female   Summary of history : . She was last seen on 09/2017  . She was referred for an abnormal CT scan of the abdomen which was performed on 06/15/2016 for abdominal pain which revealed changes of the liver suggestive of cirrhosis and moderate stool burden in the colon. A CT scan in 08/2014 did not reveal any abnormalities of her liver. LFT's in 01/2016, 11/2014 were normal particularly with albumin.Marland Kitchen Her abdominal pain has been ongoing since 1960 which I felt was due to constipation .EGD 08/31/16 showed gastritis with bx showing reactive gastropathy with no H pylori . H pylori stool antigen was negative. I advised her to stop all NSAID.Hb 13.1, normal platelet count. Hep C antibody negative. Ferritin, iron studies,celiac serology , ceruloplasmin, Factin ,A1At,AMA were normal/negative.She had to have a colonoscopy but could not have it scheduled due to insurance not covering for the same.    Interval history   08/25/2017 -11/11/2016   Weigh stable. Takes miralax daily-helps her go , at times it does not work. Says would like to try something different.                        Had internal hemorrhoids banded by Dr Marius Ditch.   Unsure if she took her amitiza or not . Presently has constipation on and off. Takes benefiber.   Not keen on a colonoscopy .  Current Outpatient Medications  Medication Sig Dispense Refill  . albuterol (PROVENTIL HFA) 108 (90 Base) MCG/ACT inhaler Inhale into the lungs.    . budesonide-formoterol (SYMBICORT) 160-4.5 MCG/ACT inhaler Inhale 2 puffs into the lungs 2 (two) times daily.    . Calcium Carb-Ergocalciferol 250-125 MG-UNIT TABS Take by mouth.    . cetirizine (ZYRTEC) 10 MG  tablet Take by mouth.    . diclofenac (VOLTAREN) 50 MG EC tablet     . dicyclomine (BENTYL) 10 MG capsule TAKE (1) CAPSULE BY MOUTH FOUR TIMES A DAY BEFORE MEALS AND AT BEDTIME 120 capsule 0  . doxycycline (MONODOX) 100 MG capsule Take 100 mg by mouth 2 (two) times daily.    Marland Kitchen escitalopram (LEXAPRO) 20 MG tablet Take by mouth.    . escitalopram (LEXAPRO) 5 MG tablet Take 5 mg by mouth daily.    Marland Kitchen etodolac (LODINE) 400 MG tablet Take 400 mg by mouth 2 (two) times daily.    Marland Kitchen HYDROcodone-acetaminophen (NORCO) 5-325 MG tablet Take 1-2 tablets by mouth every 4 (four) hours as needed for moderate pain. (Patient not taking: Reported on 10/11/2018) 15 tablet 0  . hydrocortisone 2.5 % cream     . levofloxacin (LEVAQUIN) 500 MG tablet     . pantoprazole (PROTONIX) 20 MG tablet Take 2 tablets (40 mg total) by mouth daily. 180 tablet 0  . pantoprazole (PROTONIX) 40 MG tablet     . polyethylene glycol powder (MIRALAX) powder Take 1 Container by mouth once.    . predniSONE (DELTASONE) 10 MG tablet     . Psyllium (SM FIBER) 48.57 % POWD Take by mouth.    Marland Kitchen rOPINIRole (REQUIP) 0.5 MG tablet Take 0.5 mg by mouth at bedtime.    . SUMAtriptan (IMITREX) 50  MG tablet Take 50 mg by mouth every 2 (two) hours as needed for migraine. May repeat in 2 hours if headache persists or recurs.    . traZODone (DESYREL) 50 MG tablet Take 50 mg by mouth at bedtime.    . vitamin B-12 (CYANOCOBALAMIN) 1000 MCG tablet Take by mouth.     No current facility-administered medications for this visit.     Allergies as of 11/11/2018 - Review Complete 10/25/2018  Allergen Reaction Noted  . Penicillins Rash 08/16/2015    ROS:  General: Negative for anorexia, weight loss, fever, chills, fatigue, weakness. ENT: Negative for hoarseness, difficulty swallowing , nasal congestion. CV: Negative for chest pain, angina, palpitations, dyspnea on exertion, peripheral edema.  Respiratory: Negative for dyspnea at rest, dyspnea on exertion,  cough, sputum, wheezing.  GI: See history of present illness. GU:  Negative for dysuria, hematuria, urinary incontinence, urinary frequency, nocturnal urination.  Endo: Negative for unusual weight change.    Physical Examination:   There were no vitals taken for this visit.  General: Well-nourished, well-developed in no acute distress.  Eyes: No icterus. Conjunctivae pink. Mouth: Oropharyngeal mucosa moist and pink , no lesions erythema or exudate. Lungs: Clear to auscultation bilaterally. Non-labored. Heart: Regular rate and rhythm, no murmurs rubs or gallops.  Abdomen: Bowel sounds are normal, nontender, nondistended, no hepatosplenomegaly or masses, no abdominal bruits or hernia , no rebound or guarding.   Extremities: No lower extremity edema. No clubbing or deformities. Neuro: Alert and oriented x 3.  Grossly intact. Skin: Warm and dry, no jaundice.   Psych: Alert and cooperative, normal mood and affect.   Imaging Studies: Vas [place] Abi With/wo Tbi  Result Date: 10/18/2018 LOWER EXTREMITY DOPPLER STUDY Indications: Leg pain.  Performing Technologist: Concha Norway RVT  Examination Guidelines: A complete evaluation includes at minimum, Doppler waveform signals and systolic blood pressure reading at the level of bilateral brachial, anterior tibial, and posterior tibial arteries, when vessel segments are accessible. Bilateral testing is considered an integral part of a complete examination. Photoelectric Plethysmograph (PPG) waveforms and toe systolic pressure readings are included as required and additional duplex testing as needed. Limited examinations for reoccurring indications may be performed as noted.  ABI Findings: +--------+------------------+-----+---------+--------+ Right   Rt Pressure (mmHg)IndexWaveform Comment  +--------+------------------+-----+---------+--------+ [id number]                                      +--------+------------------+-----+---------+--------+  ATA     157               1.22 triphasic         +--------+------------------+-----+---------+--------+ PTA     153               1.19 triphasic         +--------+------------------+-----+---------+--------+ +--------+------------------+-----+---------+-------+ Left    Lt Pressure (mmHg)IndexWaveform Comment +--------+------------------+-----+---------+-------+ [id number]                                     +--------+------------------+-----+---------+-------+ ATA     165               1.28 triphasic        +--------+------------------+-----+---------+-------+ PTA     161               1.25 triphasic        +--------+------------------+-----+---------+-------+ +-------+-----------+-----------+------------+------------+  ABI/TBIToday's ABIToday's TBIPrevious ABIPrevious TBI +-------+-----------+-----------+------------+------------+ Right  1.22                                           +-------+-----------+-----------+------------+------------+ Left   1.28                                           +-------+-----------+-----------+------------+------------+  Summary: Right: Resting right ankle-brachial index is within normal range. No evidence of significant right lower extremity arterial disease. Left: Resting left ankle-brachial index is within normal range. No evidence of significant left lower extremity arterial disease.  *See table(s) above for measurements and observations.  Electronically signed by Leotis Pain MD on 10/18/2018 at 10:13:20 AM.   Final    Vas [place] Lower Extremity Venous Reflux  Result Date: 10/18/2018  Lower Venous Reflux Study Other Indications: Leg pain. Performing Technologist: Concha Norway RVT  Examination Guidelines: A complete evaluation includes B-mode imaging, spectral Doppler, color Doppler, and power Doppler as needed of all accessible portions of each vessel. Bilateral testing is considered an integral part of a complete examination.  Limited examinations for reoccurring indications may be performed as noted. The reflux portion of the exam is performed with the patient in reverse Trendelenburg.  Right Venous Findings: +---------+---------------+---------+-----------+----------+-------+          CompressibilityPhasicitySpontaneityPropertiesSummary +---------+---------------+---------+-----------+----------+-------+ CFV      Full                                                 +---------+---------------+---------+-----------+----------+-------+ SFJ      Full                                                 +---------+---------------+---------+-----------+----------+-------+ FV Prox  Full                                                 +---------+---------------+---------+-----------+----------+-------+ FV Mid   Full                                                 +---------+---------------+---------+-----------+----------+-------+ FV DistalFull                                                 +---------+---------------+---------+-----------+----------+-------+ POP      Full                                                 +---------+---------------+---------+-----------+----------+-------+ GSV      Full                                                 +---------+---------------+---------+-----------+----------+-------+  SSV      Full                                                 +---------+---------------+---------+-----------+----------+-------+                         Yes      Yes                          +---------+---------------+---------+-----------+----------+-------+  Left Venous Findings: +---------+---------------+---------+-----------+----------+-------+          CompressibilityPhasicitySpontaneityPropertiesSummary +---------+---------------+---------+-----------+----------+-------+ CFV      Full                                                  +---------+---------------+---------+-----------+----------+-------+ SFJ      Full                                                 +---------+---------------+---------+-----------+----------+-------+ FV Prox  Full                                                 +---------+---------------+---------+-----------+----------+-------+ FV Mid   Full                                                 +---------+---------------+---------+-----------+----------+-------+ FV DistalFull                                                 +---------+---------------+---------+-----------+----------+-------+ POP      Full                                                 +---------+---------------+---------+-----------+----------+-------+ GSV      Full                                                 +---------+---------------+---------+-----------+----------+-------+ SSV      Full                                                 +---------+---------------+---------+-----------+----------+-------+                         Yes      Yes                          +---------+---------------+---------+-----------+----------+-------+  Summary: Right: There is no evidence of deep vein thrombosis in the lower extremity.There is no evidence of chronic venous insufficiency.There is no evidence of superficial venous thrombosis. Left: There is no evidence of deep vein thrombosis in the lower extremity.There is no evidence of chronic venous insufficiency.There is no evidence of superficial venous thrombosis.  *See table(s) above for measurements and observations. Electronically signed by Leotis Pain MD on 10/18/2018 at 10:13:22 AM.    Final     Assessment and Plan:   Abigail Powers is a 78 y.o. 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 Liver cirrhosis:Likely cryptogenic, preserved liver  function , Due for labs including Hep A/B/INR, EGD  to screen for varices in 08/2019 .  2. Constipation : would like  Atrial of amitiza again - cant recall if the prior was taken or not  3. Chronic abdominal pain : likely secondary to constipation. Discussed risks vs benefits of a colonoscopy - she is not keen presently similar to her last visit - doing well on benefiber.    Dr Jonathon Bellows  MD,MRCP [facility]) Follow up in 6 months

## 2018-11-12 ENCOUNTER — Telehealth: Payer: Self-pay

## 2018-11-12 LAB — COMPREHENSIVE METABOLIC PANEL
ALT: 11 IU/L (ref 0–32)
AST: 16 IU/L (ref 0–40)
Albumin/Globulin Ratio: 2 (ref 1.2–2.2)
Albumin: 4.1 g/dL (ref 3.7–4.7)
Alkaline Phosphatase: 62 IU/L (ref 39–117)
BUN/Creatinine Ratio: 22 (ref 12–28)
BUN: 19 mg/dL (ref 8–27)
Bilirubin Total: 0.3 mg/dL (ref 0.0–1.2)
CO2: 23 mmol/L (ref 20–29)
Calcium: 9.9 mg/dL (ref 8.7–10.3)
Chloride: 101 mmol/L (ref 96–106)
Creatinine, Ser: 0.85 mg/dL (ref 0.57–1.00)
GFR calc Af Amer: 77 mL/min/{1.73_m2} (ref >59)
GFR calc non Af Amer: 67 mL/min/{1.73_m2} (ref >59)
Globulin, Total: 2.1 g/dL (ref 1.5–4.5)
Glucose: 144 mg/dL — ABNORMAL HIGH (ref 65–99)
Potassium: 4.6 mmol/L (ref 3.5–5.2)
Sodium: 139 mmol/L (ref 134–144)
Total Protein: 6.2 g/dL (ref 6.0–8.5)

## 2018-11-12 LAB — CBC WITH DIFFERENTIAL/PLATELET
Basophils Absolute: 0 10*3/uL (ref 0.0–0.2)
Basos: 1 %
EOS (ABSOLUTE): 0 10*3/uL (ref 0.0–0.4)
Eos: 0 %
Hematocrit: 38.3 % (ref 34.0–46.6)
Hemoglobin: 13.3 g/dL (ref 11.1–15.9)
Immature Grans (Abs): 0.2 10*3/uL — ABNORMAL HIGH (ref 0.0–0.1)
Immature Granulocytes: 2 %
Lymphocytes Absolute: 0.9 10*3/uL (ref 0.7–3.1)
Lymphs: 11 %
MCH: 32.3 pg (ref 26.6–33.0)
MCHC: 34.7 g/dL (ref 31.5–35.7)
MCV: 93 fL (ref 79–97)
MONOS ABS: 0.2 10*3/uL (ref 0.1–0.9)
Monocytes: 3 %
Neutrophils Absolute: 6.8 10*3/uL (ref 1.4–7.0)
Neutrophils: 83 %
Platelets: 414 10*3/uL (ref 150–450)
RBC: 4.12 x10E6/uL (ref 3.77–5.28)
RDW: 12.5 % (ref 11.7–15.4)
WBC: 8.1 10*3/uL (ref 3.4–10.8)

## 2018-11-12 LAB — HEPATITIS A ANTIBODY, TOTAL: Hep A Total Ab: POSITIVE — AB

## 2018-11-12 LAB — PROTIME-INR
INR: 1 (ref 0.8–1.2)
Prothrombin Time: 10.9 s (ref 9.1–12.0)

## 2018-11-12 LAB — HEPATITIS B SURFACE ANTIBODY,QUALITATIVE: Hep B Surface Ab, Qual: REACTIVE

## 2018-11-12 NOTE — Telephone Encounter (Signed)
Called pt to inform her of lab results.  Unable to contact, LVM to return call 

## 2018-11-12 NOTE — Telephone Encounter (Signed)
-----   Message from Jonathon Bellows, MD sent at 11/12/2018  8:38 AM EST ----- Sherald Hess inform labs look good and she is immune to hep A/B  C/c Rusty Aus, MD   Dr Jonathon Bellows MD,MRCP [facility]) Gastroenterology/Hepatology Pager: [phone]

## 2018-11-14 ENCOUNTER — Ambulatory Visit
Admission: RE | Admit: 2018-11-14 | Discharge: 2018-11-14 | Disposition: A | Discharge status: Home / Self Care | Payer: Medicare Other | Source: Ambulatory Visit | Attending: Gastroenterology | Admitting: Gastroenterology

## 2018-11-14 DIAGNOSIS — K7460 Unspecified cirrhosis of liver: Secondary | ICD-10-CM | POA: Insufficient documentation

## 2018-11-18 ENCOUNTER — Encounter: Payer: Self-pay | Admitting: Gastroenterology

## 2018-11-19 ENCOUNTER — Telehealth: Payer: Self-pay | Admitting: Gastroenterology

## 2018-11-19 NOTE — Telephone Encounter (Signed)
-----   Message from Jonathon Bellows, MD sent at 11/18/2018 10:28 AM EST ----- usg shows everything is stable. Repeat in 6 months to screen for [facility]

## 2018-11-19 NOTE — Telephone Encounter (Signed)
Pt left vm she states she had Labs done and would like thew results  As well as an U/S  To see if those results are back also she was given  rx Amatiza 8 mg which has not helped her at all please call pt

## 2018-11-21 NOTE — Telephone Encounter (Signed)
PATIENT CALLED AGAIN TO state she was given samples of Amitiza 8mg  that did not do anything for her. Please advise     Uses pharmacy [facility] In [place]

## 2018-11-21 NOTE — Telephone Encounter (Signed)
Pt is calling for results again

## 2018-11-22 NOTE — Telephone Encounter (Signed)
Pt left vm she states she has called 3 times for her results please call pt

## 2018-11-22 NOTE — Telephone Encounter (Signed)
Spoke with pt and informed her of lab and ultrasound results. Pt has also been offered  Amitiza 24 mcg samples available for pick up at our office. Pt plans to pick up samples next week.

## 2018-12-05 ENCOUNTER — Telehealth: Payer: Self-pay | Admitting: Gastroenterology

## 2018-12-05 ENCOUNTER — Other Ambulatory Visit: Payer: Self-pay

## 2018-12-05 MED ORDER — LUBIPROSTONE 24 MCG PO CAPS: 24.0000 ug | ORAL_CAPSULE | Freq: Two times a day (BID) | ORAL | 1 refills | Status: AC

## 2018-12-05 NOTE — Telephone Encounter (Signed)
Pt is calling Dr. Vicente Males gave her samples of rx Amitiza 24 mg she would like to get prescription send to [facility] in [place]

## 2018-12-05 NOTE — Telephone Encounter (Signed)
Spoke with pt and informed her that we have sent the prescription for the Amitiza to her preferred pharmacy.

## 2019-07-28 ENCOUNTER — Other Ambulatory Visit: Payer: Self-pay | Admitting: Internal Medicine

## 2019-07-28 DIAGNOSIS — Z1231 Encounter for screening mammogram for malignant neoplasm of breast: Secondary | ICD-10-CM

## 2019-08-26 ENCOUNTER — Inpatient Hospital Stay: Admission: RE | Admit: 2019-08-26 | Payer: Medicare Other | Source: Ambulatory Visit

## 2019-09-02 ENCOUNTER — Other Ambulatory Visit: Payer: Self-pay

## 2019-09-02 ENCOUNTER — Ambulatory Visit
Admission: RE | Admit: 2019-09-02 | Discharge: 2019-09-02 | Disposition: A | Discharge status: Home / Self Care | Payer: Medicare Other | Source: Ambulatory Visit | Attending: Internal Medicine | Admitting: Internal Medicine

## 2019-09-02 DIAGNOSIS — Z1231 Encounter for screening mammogram for malignant neoplasm of breast: Secondary | ICD-10-CM | POA: Diagnosis present

## 2019-09-03 ENCOUNTER — Ambulatory Visit (INDEPENDENT_AMBULATORY_CARE_PROVIDER_SITE_OTHER): Payer: Medicare Other | Admitting: Podiatry

## 2019-09-03 DIAGNOSIS — M778 Other enthesopathies, not elsewhere classified: Secondary | ICD-10-CM | POA: Diagnosis not present

## 2019-09-03 DIAGNOSIS — S91012A Laceration without foreign body, left ankle, initial encounter: Secondary | ICD-10-CM | POA: Diagnosis not present

## 2019-09-03 MED ORDER — DOXYCYCLINE HYCLATE 100 MG PO TABS: 100.0000 mg | ORAL_TABLET | Freq: Two times a day (BID) | ORAL | 0 refills | Status: DC

## 2019-09-03 MED ORDER — MUPIROCIN 2 % EX OINT: TOPICAL_OINTMENT | CUTANEOUS | 2 refills | Status: DC

## 2019-09-03 NOTE — Progress Notes (Signed)
She presents today chief complaint of plantar fascial flareup of her right forefoot.  She states it hurts when she bends her toes.  She is also complaining of painful first metatarsophalangeal joint left foot.  And a laceration from the screen door to her left Achilles.  Objective: Vital signs are stable she is alert oriented x3.  Pulses are palpable.  Laceration does demonstrate moderate severe erythema along the medial and posterior medial aspect of the watershed area of the Achilles.  She is very lucky that this did not rupture her Achilles or lacerate the Achilles in any way.  This does appear to be a dermal epidermal type abrasion it is deep it is inflamed is mildly erythematous no purulence no malodor but tender to touch.  She has pain on palpation second metatarsophalangeal joint of the right foot with pain and range of motion.  She also has pain in range of motion of the first metatarsophalangeal joint of the left foot.  Assessment: Laceration left heel.  Plan fasciitis capsulitis second metatarsophalangeal joint right foot.  And first metatarsal phalangeal joint capsulitis left.  Plan: Discussed etiology pathology conservative therapy at this point time wrote a prescription for doxycycline to be followed by Bactroban ointment to be applied on a dry sterile dressing once daily and I also injected the first metatarsophalangeal joint as well as the second metatarsophalangeal joint local anesthetic.

## 2019-09-17 ENCOUNTER — Other Ambulatory Visit: Payer: Self-pay

## 2019-09-17 ENCOUNTER — Ambulatory Visit (INDEPENDENT_AMBULATORY_CARE_PROVIDER_SITE_OTHER): Payer: Medicare Other | Admitting: Podiatry

## 2019-09-17 ENCOUNTER — Encounter: Payer: Self-pay | Admitting: Podiatry

## 2019-09-17 DIAGNOSIS — S91012D Laceration without foreign body, left ankle, subsequent encounter: Secondary | ICD-10-CM

## 2019-09-17 MED ORDER — CLINDAMYCIN HCL 150 MG PO CAPS: 150.0000 mg | ORAL_CAPSULE | Freq: Three times a day (TID) | ORAL | 1 refills | Status: DC

## 2019-09-17 NOTE — Progress Notes (Signed)
She presents today for follow-up of a laceration to the posterior aspect of her left heel.  She was hit by a storm door along her distal and Achilles states that she has even accidentally stuck her hallux nail into the laceration.  She took her doxycycline states that she still has not had any drainage the doxycycline really did not make it feel any better she continues her Bactroban ointment daily which really has not made any better either.  Objective: Pulses remain palpable there is no edema to the leg the laceration just appears to not be improving.  There is an area of erythema surrounding it approximately a centimeter away from the laceration but does not appear to be ascending.  Assessment: Laceration Achilles area distal.  Plan: Start her on clindamycin today continue the use of the Bactroban ointment she will follow up with Dr. Amalia Hailey next week for evaluation of possible surgical repair of the skin or wound VAC.

## 2019-09-23 ENCOUNTER — Other Ambulatory Visit: Payer: Self-pay

## 2019-09-23 ENCOUNTER — Encounter

## 2019-09-23 ENCOUNTER — Ambulatory Visit (INDEPENDENT_AMBULATORY_CARE_PROVIDER_SITE_OTHER): Payer: Medicare Other | Admitting: Podiatry

## 2019-09-23 ENCOUNTER — Encounter: Payer: Self-pay | Admitting: Podiatry

## 2019-09-23 DIAGNOSIS — L97322 Non-pressure chronic ulcer of left ankle with fat layer exposed: Secondary | ICD-10-CM

## 2019-09-23 MED ORDER — GENTAMICIN SULFATE 0.1 % EX CREA: 1.0000 "application " | TOPICAL_CREAM | Freq: Two times a day (BID) | CUTANEOUS | 1 refills | Status: DC

## 2019-09-29 NOTE — Progress Notes (Signed)
   HPI: 78 y.o. female presenting today for follow up evaluation of a laceration to the left heel. She states the wound is improving but is still sore and aching intermittently. She reports some associated redness. She continues to take the Clindamycin as prescribed. There are no worsening factors noted. Patient is here for further evaluation and treatment.   Past Medical History:  Diagnosis Date  . Asthma   . COPD (chronic obstructive pulmonary disease) ([place])   . GERD (gastroesophageal reflux disease)   . Hyperlipidemia      Physical Exam: General: The patient is alert and oriented x3 in no acute distress.  Dermatology: Wound #1 noted to the posterior left heel measuring approximately 0.7 x 2.5 x 0.2 cm.   To the above-noted ulceration, there is no eschar. There is a moderate amount of slough, fibrin and necrotic tissue. Granulation tissue and wound base is red. There is no malodor. There is a minimal amount of serosanginous drainage noted. Periwound integrity is intact.  Skin is warm, dry and supple bilateral lower extremities.   Vascular: Palpable pedal pulses bilaterally. No edema or erythema noted. Capillary refill within normal limits.  Neurological: Epicritic and protective threshold grossly intact bilaterally.   Musculoskeletal Exam: Range of motion within normal limits to all pedal and ankle joints bilateral. Muscle strength 5/5 in all groups bilateral.   Assessment: 1. Ulceration left posterior heel secondary to trauma   Plan of Care:  1. Patient evaluated.  2. Medically necessary excisional debridement including subcutaneous tissue was performed using a tissue nipper and a chisel blade. Excisional debridement of all the necrotic nonviable tissue down to healthy bleeding viable tissue was performed with post-debridement measurements same as pre-. 3. The wound was cleansed and dry sterile dressing applied. 4. Prescription for Gentamicin cream provided to patient to use  daily with a bandage.  5. Continue taking Clindamycin as directed by Dr. Milinda Pointer.  6. CAM boot dispensed. Weightbearing as tolerated.  7. Return to clinic in 3 weeks.        Edrick Kins, DPM [facility]  Dr. Edrick Kins, DPM    [address], [place] [address]                Office [phone]  Fax [phone]

## 2019-10-14 ENCOUNTER — Encounter: Payer: Self-pay | Admitting: Podiatry

## 2019-10-14 ENCOUNTER — Other Ambulatory Visit: Payer: Self-pay

## 2019-10-14 ENCOUNTER — Ambulatory Visit (INDEPENDENT_AMBULATORY_CARE_PROVIDER_SITE_OTHER): Payer: Medicare Other | Admitting: Podiatry

## 2019-10-14 DIAGNOSIS — L97322 Non-pressure chronic ulcer of left ankle with fat layer exposed: Secondary | ICD-10-CM

## 2019-10-14 DIAGNOSIS — S91012D Laceration without foreign body, left ankle, subsequent encounter: Secondary | ICD-10-CM

## 2019-10-17 NOTE — Progress Notes (Signed)
   HPI: 79 y.o. female presenting today for follow up evaluation of an ulceration to the left heel. She states she is doing well and has significantly improved. She took the Clindamycin and has been using the Gentamicin cream as directed. She has also been using the CAM boot. She denies any aggravating factors. Patient is here for further evaluation and treatment.   Past Medical History:  Diagnosis Date  . Asthma   . COPD (chronic obstructive pulmonary disease) ([place])   . GERD (gastroesophageal reflux disease)   . Hyperlipidemia      Physical Exam: General: The patient is alert and oriented x3 in no acute distress.  Dermatology: Wound noted to the left posterior heel has healed. Complete re-epithelialization has occurred. No drainage noted.  Skin is warm, dry and supple bilateral lower extremities.   Vascular: Palpable pedal pulses bilaterally. No edema or erythema noted. Capillary refill within normal limits.  Neurological: Epicritic and protective threshold grossly intact bilaterally.   Musculoskeletal Exam: Range of motion within normal limits to all pedal and ankle joints bilateral. Muscle strength 5/5 in all groups bilateral.   Assessment: 1. Ulceration left posterior heel secondary to trauma - healed    Plan of Care:  1. Patient evaluated.  2. Discontinue using CAM boot.  3. Recommended good shoe gear.  4. May resume full activity with no restrictions.  5. Return to clinic as needed.      Edrick Kins, DPM [facility]  Dr. Edrick Kins, DPM    [address], [place] [address]                Office [phone]  Fax [phone]

## 2020-06-23 ENCOUNTER — Encounter: Payer: Self-pay | Admitting: Dermatology

## 2020-06-23 ENCOUNTER — Other Ambulatory Visit: Payer: Self-pay

## 2020-06-23 ENCOUNTER — Ambulatory Visit: Payer: Medicare Other | Admitting: Dermatology

## 2020-06-23 DIAGNOSIS — C4492 Squamous cell carcinoma of skin, unspecified: Secondary | ICD-10-CM

## 2020-06-23 DIAGNOSIS — D489 Neoplasm of uncertain behavior, unspecified: Secondary | ICD-10-CM

## 2020-06-23 DIAGNOSIS — L578 Other skin changes due to chronic exposure to nonionizing radiation: Secondary | ICD-10-CM

## 2020-06-23 DIAGNOSIS — D485 Neoplasm of uncertain behavior of skin: Secondary | ICD-10-CM | POA: Diagnosis not present

## 2020-06-23 DIAGNOSIS — L570 Actinic keratosis: Secondary | ICD-10-CM | POA: Diagnosis not present

## 2020-06-23 DIAGNOSIS — Z85828 Personal history of other malignant neoplasm of skin: Secondary | ICD-10-CM

## 2020-06-23 HISTORY — DX: Personal history of other malignant neoplasm of skin: Z85.828

## 2020-06-23 HISTORY — DX: Squamous cell carcinoma of skin, unspecified: C44.92

## 2020-06-23 NOTE — Progress Notes (Addendum)
New Patient Visit  Subjective  Abigail Powers is a 79 y.o. female who presents for the following: New Patient (Initial Visit).   Patient presents today as a New patient, has a few concerns on her nose that she would like to have evaluated today. This has been present for about 2 years, has had cryotherapy 2-3 times at her previous dermatologist and it has not gone away. Patient states that the area is painful only when area is pressed on. It may have gotten thicker. Patient wanted second opinion because she was concerned that it may be some type of cancer that she may need to have treated and last dermatologist didn't seem to be as concerned. Patient has h/o BCC.  The following portions of the chart were reviewed this encounter and updated as appropriate:  Tobacco  Allergies  Meds  Problems  Med Hx  Surg Hx  Fam Hx      Review of Systems:  No other skin or systemic complaints except as noted in HPI or Assessment and Plan.  Objective  Well appearing patient in no apparent distress; mood and affect are within normal limits.  A focused examination was performed including head, including the scalp, face, neck, nose, ears, eyelids, and lips, chest, bilateral arms, bilateral hands, nails. Relevant physical exam findings are noted in the Assessment and Plan.  Objective  Left Nasal Sidewall Extending to Eyelid: 1.3 x 0.9 cm Scaly pink plaque     Objective  Mid Tip of Nose, Right Lower Cheek, Right Mid Cheek, Right Upper  Cheek: Erythematous thin papules/macules with gritty scale.    Assessment & Plan  Neoplasm of uncertain behavior Left Nasal Sidewall Extending to Eyelid  Skin / nail biopsy Type of biopsy: punch   Informed consent: discussed and consent obtained   Anesthesia: the lesion was anesthetized in a standard fashion   Anesthesia comment:  Area prepped with Alochol Anesthetic:  1% lidocaine w/ epinephrine 1-100,000 buffered w/ 8.4% NaHCO3 Punch size:  3  mm Suture size:  5-0 Suture type: nylon   Suture removal (days):  7 Hemostasis achieved with: suture and pressure   Outcome: patient tolerated procedure well   Post-procedure details: sterile dressing applied and wound care instructions given   Post-procedure details comment:  Ointment and bandage applied Dressing type: bandage (mupirocin)    Specimen 1 - Surgical pathology Differential Diagnosis: R/O SCC Check Margins: No 1.3 x 0.9 cm Scaly pink plaque  AK (actinic keratosis) (4) Mid Tip of Nose; Right Mid Cheek; Right Upper  Cheek; Right Lower Cheek  Cryotherapy today Prior to procedure, discussed risks of blister formation, small wound, skin dyspigmentation, or rare scar following cryotherapy.    Destruction of lesion - Mid Tip of Nose, Right Lower Cheek, Right Mid Cheek, Right Upper  Cheek  Destruction method: cryotherapy   Informed consent: discussed and consent obtained   Lesion destroyed using liquid nitrogen: Yes   Cryotherapy cycles:  2 Outcome: patient tolerated procedure well with no complications   Post-procedure details: wound care instructions given    Actinic Damage - diffuse scaly erythematous macules with underlying dyspigmentation - Recommend daily broad spectrum sunscreen SPF 30+ to sun-exposed areas, reapply every 2 hours as needed.  - Call for new or changing lesions.  Return in about 1 week (around 06/30/2020) for suture removal / Follow up. Return next available for FBSE and AK follow-up.  IDonzetta Kohut, CMA, am acting as scribe for Forest Gleason, MD .  Documentation: I  have reviewed the above documentation for accuracy and completeness, and I agree with the above.  Forest Gleason, MD

## 2020-06-23 NOTE — Patient Instructions (Addendum)
Recommend daily broad spectrum sunscreen SPF 30+ to sun-exposed areas, reapply every 2 hours as needed. Call for new or changing lesions.  Prior to procedure, discussed risks of blister formation, small wound, skin dyspigmentation, or rare scar following cryotherapy.  Liquid nitrogen was applied for 10-12 seconds to the skin lesion and the expected blistering or scabbing reaction explained. Do not pick at the area. Patient reminded to expect hypopigmented scars from the procedure. Return if lesion fails to fully resolve.  Cryotherapy Aftercare  . Wash gently with soap and water everyday.   . Apply Vaseline and Band-Aid daily until healed.   Wound Care Instructions  1. Cleanse wound gently with soap and water once a day then pat dry with clean gauze. Apply a thing coat of Petrolatum (petroleum jelly, "Vaseline") over the wound (unless you have an allergy to this). We recommend that you use a new, sterile tube of Vaseline. Do not pick or remove scabs. Do not remove the yellow or white "healing tissue" from the base of the wound.  2. Cover the wound with fresh, clean, nonstick gauze and secure with paper tape. You may use Band-Aids in place of gauze and tape if the would is small enough, but would recommend trimming much of the tape off as there is often too much. Sometimes Band-Aids can irritate the skin.  3. You should call the office for your biopsy report after 1 week if you have not already been contacted.  4. If you experience any problems, such as abnormal amounts of bleeding, swelling, significant bruising, significant pain, or evidence of infection, please call the office immediately.  5. FOR ADULT SURGERY PATIENTS: If you need something for pain relief you may take 1 extra strength Tylenol (acetaminophen) AND 2 Ibuprofen (200mg each) together every 4 hours as needed for pain. (do not take these if you are allergic to them or if you have a reason you should not take them.) Typically, you  may only need pain medication for 1 to 3 days.     

## 2020-06-30 ENCOUNTER — Other Ambulatory Visit: Payer: Self-pay

## 2020-06-30 ENCOUNTER — Encounter: Payer: Self-pay | Admitting: Dermatology

## 2020-06-30 ENCOUNTER — Ambulatory Visit (INDEPENDENT_AMBULATORY_CARE_PROVIDER_SITE_OTHER): Payer: Medicare Other | Admitting: Dermatology

## 2020-06-30 DIAGNOSIS — C4492 Squamous cell carcinoma of skin, unspecified: Secondary | ICD-10-CM

## 2020-06-30 DIAGNOSIS — Z4802 Encounter for removal of sutures: Secondary | ICD-10-CM

## 2020-06-30 DIAGNOSIS — C44329 Squamous cell carcinoma of skin of other parts of face: Secondary | ICD-10-CM

## 2020-06-30 NOTE — Patient Instructions (Signed)
Recommend daily broad spectrum sunscreen SPF 30+ to sun-exposed areas, reapply every 2 hours as needed. Call for new or changing lesions.  

## 2020-06-30 NOTE — Progress Notes (Signed)
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 Jerilynn Mages called 9/29 at 9:40 AM. She is coming for suture removal appointment at 11 am so we will discuss results then.

## 2020-06-30 NOTE — Progress Notes (Signed)
   Follow-Up Visit   Subjective  Abigail Powers is a 79 y.o. female who presents for the following: Follow-up (OV 06/23/20 for SCC and discuss removal options and suture removal).    The following portions of the chart were reviewed this encounter and updated as appropriate:  Tobacco  Allergies  Meds  Problems  Med Hx  Surg Hx  Fam Hx      Review of Systems:  No other skin or systemic complaints except as noted in HPI or Assessment and Plan.  Objective  Well appearing patient in no apparent distress; mood and affect are within normal limits.  A focused examination was performed including face, eyelids, nose. Relevant physical exam findings are noted in the Assessment and Plan.  Objective  Left Nasal Sidewall: Keratotic pink plaque.  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 Manley Mason in [place]. Reviewed Mohs surgery process and answered questions.  Plan FBSE next available  No cervical or axillary lymphadenopathy on exam today  Other Related Procedures Ambulatory referral to Dermatology  Encounter for removal of sutures Left Root of Nose  Wound cleansed, sutures removed, wound cleansed and steri strips applied. Discussed pathology results.   Return in about 6 months (around 12/28/2020) for TBSE.  I, Donzetta Kohut, scribed for Alfonso Patten, MD.  Documentation: I have reviewed the above documentation for accuracy and completeness, and I agree with the above.  Forest Gleason, MD

## 2020-08-16 ENCOUNTER — Other Ambulatory Visit: Payer: Self-pay | Admitting: Internal Medicine

## 2020-08-16 DIAGNOSIS — Z1231 Encounter for screening mammogram for malignant neoplasm of breast: Secondary | ICD-10-CM

## 2020-09-13 ENCOUNTER — Ambulatory Visit
Admission: RE | Admit: 2020-09-13 | Discharge: 2020-09-13 | Disposition: A | Discharge status: Home / Self Care | Payer: Medicare Other | Source: Ambulatory Visit | Attending: Internal Medicine | Admitting: Internal Medicine

## 2020-09-13 ENCOUNTER — Other Ambulatory Visit: Payer: Self-pay

## 2020-09-13 DIAGNOSIS — Z1231 Encounter for screening mammogram for malignant neoplasm of breast: Secondary | ICD-10-CM | POA: Diagnosis present

## 2020-10-07 ENCOUNTER — Ambulatory Visit (INDEPENDENT_AMBULATORY_CARE_PROVIDER_SITE_OTHER): Payer: Medicare Other | Admitting: Dermatology

## 2020-10-07 ENCOUNTER — Other Ambulatory Visit: Payer: Self-pay

## 2020-10-07 DIAGNOSIS — L821 Other seborrheic keratosis: Secondary | ICD-10-CM

## 2020-10-07 DIAGNOSIS — L820 Inflamed seborrheic keratosis: Secondary | ICD-10-CM | POA: Diagnosis not present

## 2020-10-07 DIAGNOSIS — Z85828 Personal history of other malignant neoplasm of skin: Secondary | ICD-10-CM | POA: Diagnosis not present

## 2020-10-07 DIAGNOSIS — D1800 Hemangioma unspecified site: Secondary | ICD-10-CM

## 2020-10-07 DIAGNOSIS — Z1283 Encounter for screening for malignant neoplasm of skin: Secondary | ICD-10-CM

## 2020-10-07 DIAGNOSIS — L578 Other skin changes due to chronic exposure to nonionizing radiation: Secondary | ICD-10-CM

## 2020-10-07 DIAGNOSIS — L905 Scar conditions and fibrosis of skin: Secondary | ICD-10-CM

## 2020-10-07 DIAGNOSIS — L814 Other melanin hyperpigmentation: Secondary | ICD-10-CM

## 2020-10-07 DIAGNOSIS — D229 Melanocytic nevi, unspecified: Secondary | ICD-10-CM

## 2020-10-07 NOTE — Progress Notes (Signed)
Follow-Up Visit   Subjective  Abigail Powers is a 80 y.o. female who presents for the following: FBSE (Patient here for full body skin exam and skin cancer screening. She has a history of SCC at left nasal sidewall treated with Moh's at [facility]. ). She notes scar is raised and sometimes bothersome.   Patient not aware of anything new or changing. There is a spot at left back that is irritated for patient.   The following portions of the chart were reviewed this encounter and updated as appropriate:   Tobacco  Allergies  Meds  Problems  Med Hx  Surg Hx  Fam Hx      Review of Systems:  No other skin or systemic complaints except as noted in HPI or Assessment and Plan.  Objective  Well appearing patient in no apparent distress; mood and affect are within normal limits.  A full examination was performed including scalp, head, eyes, ears, nose, lips, neck, chest, axillae, abdomen, back, buttocks, bilateral upper extremities, bilateral lower extremities, hands, feet, fingers, toes, fingernails, and toenails. All findings within normal limits unless otherwise noted below.  Objective  Left Flank: Erythematous keratotic or waxy stuck-on papule or plaque.   Objective  Left Malar Cheek: Thickened scar   Assessment & Plan  Inflamed seborrheic keratosis Left Flank  Prior to procedure, discussed risks of blister formation, small wound, skin dyspigmentation, or rare scar following cryotherapy.    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  Intralesional injection - Left Malar Cheek Location: left nasal  sidewall  Informed Consent: Discussed risks (infection, pain, bleeding, bruising, thinning of the skin, loss of skin pigment, lack of resolution, and recurrence of lesion) and benefits of the procedure, as well as the alternatives. Informed consent was obtained. Preparation: The area was prepared a standard fashion.  Procedure Details: An intralesional injection was performed with Kenalog 1.25 mg/cc. 1.0 cc in total were injected.  Total number of injections: 1  Plan: The patient was instructed on post-op care. Recommend OTC analgesia as needed for pain.    Lentigines - Scattered tan macules - Discussed due to sun exposure - Benign, observe - Call for any changes  Seborrheic Keratoses - Stuck-on, waxy, tan-brown papules and plaques  - Discussed benign etiology and prognosis. - Observe - Call for any changes  Melanocytic Nevi - Tan-brown and/or pink-flesh-colored symmetric macules and papules - Benign appearing on exam today - Observation - Call clinic for new or changing moles - Recommend daily use of broad spectrum spf 30+ sunscreen to sun-exposed areas.   Hemangiomas - Red papules - Discussed benign nature - Observe - Call for any changes  Actinic Damage - Chronic, secondary to cumulative UV/sun exposure - diffuse scaly erythematous macules with underlying dyspigmentation - Recommend daily broad spectrum sunscreen SPF 30+ to sun-exposed areas, reapply every 2 hours as needed.  - Call for new or changing lesions.  Skin cancer screening performed today.  History of Squamous Cell Carcinoma of the Skin - No evidence of recurrence today at left nasal sidewall extending to eyelid - No lymphadenopathy - Recommend regular full body skin exams - Recommend daily broad spectrum  sunscreen SPF 30+ to sun-exposed areas, reapply every 2 hours as needed.  - Call if any new or changing lesions are noted between office visits  Return in about 6 months (around 04/06/2021) for  TBSE.  Graciella Belton, RMA, am acting as scribe for Forest Gleason, MD .  Documentation: I have reviewed the above documentation for accuracy and completeness, and I agree with the above.  Forest Gleason, MD

## 2020-10-07 NOTE — Patient Instructions (Addendum)
Melanoma ABCDEs  Melanoma is the most dangerous type of skin cancer, and is the leading cause of death from skin disease.  You are more likely to develop melanoma if you:  Have light-colored skin, light-colored eyes, or red or blond hair  Spend a lot of time in the sun  Tan regularly, either outdoors or in a tanning bed  Have had blistering sunburns, especially during childhood  Have a close family member who has had a melanoma  Have atypical moles or large birthmarks  Early detection of melanoma is key since treatment is typically straightforward and cure rates are extremely high if we catch it early.   The first sign of melanoma is often a change in a mole or a new dark spot.  The ABCDE system is a way of remembering the signs of melanoma.  A for asymmetry:  The two halves do not match. B for border:  The edges of the growth are irregular. C for color:  A mixture of colors are present instead of an even brown color. D for diameter:  Melanomas are usually (but not always) greater than 80mm - the size of a pencil eraser. E for evolution:  The spot keeps changing in size, shape, and color.  Please check your skin once per month between visits. You can use a small mirror in front and a large mirror behind you to keep an eye on the back side or your body.   If you see any new or changing lesions before your next follow-up, please call to schedule a visit.  Please continue daily skin protection including broad spectrum sunscreen SPF 30+ to sun-exposed areas, reapplying every 2 hours as needed when you're outdoors.   Prior to procedure, discussed risks of blister formation, small wound, skin dyspigmentation, or rare scar following cryotherapy.   Cryotherapy Aftercare  . Wash gently with soap and water everyday.   Marland Kitchen Apply Vaseline and Band-Aid daily until healed.  Recommend Serica scar cream

## 2020-10-26 ENCOUNTER — Encounter: Payer: Self-pay | Admitting: Dermatology

## 2020-11-30 DIAGNOSIS — U071 COVID-19: Secondary | ICD-10-CM

## 2020-11-30 HISTORY — DX: COVID-19: U07.1

## 2020-12-21 DIAGNOSIS — Z8616 Personal history of COVID-19: Secondary | ICD-10-CM | POA: Insufficient documentation

## 2021-03-30 DIAGNOSIS — F330 Major depressive disorder, recurrent, mild: Secondary | ICD-10-CM | POA: Insufficient documentation

## 2021-04-06 ENCOUNTER — Encounter: Payer: Medicare Other | Admitting: Dermatology

## 2021-05-19 ENCOUNTER — Other Ambulatory Visit: Payer: Self-pay

## 2021-05-19 ENCOUNTER — Ambulatory Visit: Payer: Medicare Other | Admitting: Dermatology

## 2021-05-19 DIAGNOSIS — Z85828 Personal history of other malignant neoplasm of skin: Secondary | ICD-10-CM

## 2021-05-19 DIAGNOSIS — Z1283 Encounter for screening for malignant neoplasm of skin: Secondary | ICD-10-CM | POA: Diagnosis not present

## 2021-05-19 DIAGNOSIS — L814 Other melanin hyperpigmentation: Secondary | ICD-10-CM

## 2021-05-19 DIAGNOSIS — D229 Melanocytic nevi, unspecified: Secondary | ICD-10-CM

## 2021-05-19 DIAGNOSIS — R21 Rash and other nonspecific skin eruption: Secondary | ICD-10-CM | POA: Diagnosis not present

## 2021-05-19 DIAGNOSIS — D692 Other nonthrombocytopenic purpura: Secondary | ICD-10-CM

## 2021-05-19 DIAGNOSIS — L578 Other skin changes due to chronic exposure to nonionizing radiation: Secondary | ICD-10-CM | POA: Diagnosis not present

## 2021-05-19 DIAGNOSIS — D1800 Hemangioma unspecified site: Secondary | ICD-10-CM

## 2021-05-19 DIAGNOSIS — L821 Other seborrheic keratosis: Secondary | ICD-10-CM

## 2021-05-19 MED ORDER — PREDNISONE 5 MG PO TABS: ORAL_TABLET | ORAL | 0 refills | Status: DC

## 2021-05-19 MED ORDER — CLOBETASOL PROPIONATE 0.05 % EX OINT: 1.0000 "application " | TOPICAL_OINTMENT | Freq: Two times a day (BID) | CUTANEOUS | 0 refills | Status: DC

## 2021-05-19 NOTE — Patient Instructions (Addendum)
Start clobetasol 0.05% ointment twice daily to itchy spots for up to 2 weeks as needed. Avoid applying to face, groin, and axilla. Use as directed. Risk of skin atrophy with long-term use reviewed.   Topical steroids (such as triamcinolone, fluocinolone, fluocinonide, mometasone, clobetasol, halobetasol, betamethasone, hydrocortisone) can cause thinning and lightening of the skin if they are used for too long in the same area. Your physician has selected the right strength medicine for your problem and area affected on the body. Please use your medication only as directed by your physician to prevent side effects.   Recommend OTC Gold Bond Rapid Relief Anti-Itch cream (pramoxine + menthol) up to 3 times per day to areas that are itchy.  2 Week Prednisone Taper  You will be given a prescription for 100 tablets of oral Prednisone. It is very important that you take this according to the exact schedule provided below. This type of regimen for taking medication is often called a "taper", because your dosage will steadily decrease over a two week period until it is discontinued altogether.  ALWAYS take this medicine with food to prevent it from irritating your stomach. You should also take your Prednisone during morning hours.  Call the clinic at [phone] if you gain more than two pounds in one day, notice swelling anywhere on your body, have shortness of breath, black or red bowel movements, brown or red vomitus, desire to drink large amounts of fluids, a fever, or extreme weakness.   Oral Prednisone over Two Weeks  Day  Week 1  Week '2   1  12 '$ tablets  7 tablets   2  12 tablets  6 tablets   3  11 tablets  5 tablets   4  10 tablets  4 tablets   5  10 tablets  3 tablets   6  9 tablets  2 tablets   7  8 tablets  1 tablet   Risks of prednisone taper discussed including mood irritability, insomnia, weight gain, stomach ulcers, increased risk of infection, increased blood sugar (diabetes),  hypertension, osteoporosis with long-term or frequent use, and rare risk of avascular necrosis of the hip.   Recommend daily broad spectrum sunscreen SPF 30+ to sun-exposed areas, reapply every 2 hours as needed. Call for new or changing lesions.  Staying in the shade or wearing long sleeves, sun glasses (UVA+UVB protection) and wide brim hats (4-inch brim around the entire circumference of the hat) are also recommended for sun protection.   If you have any questions or concerns for your doctor, please call our main line at [phone] and press option 4 to reach your doctor's medical assistant. If no one answers, please leave a voicemail as directed and we will return your call as soon as possible. Messages left after 4 pm will be answered the following business day.   You may also send [place] a message via [place]. We typically respond to MyChart messages within 1-2 business days.  For prescription refills, please ask your pharmacy to contact our office. Our fax number is [phone].  If you have an urgent issue when the clinic is closed that cannot wait until the next business day, you can page your doctor at the number below.    Please note that while we do our best to be available for urgent issues outside of office hours, we are not available 24/7.   If you have an urgent issue and are unable to reach [place], you may choose to seek  medical care at your doctor's office, retail clinic, urgent care center, or emergency room.  If you have a medical emergency, please immediately call 911 or go to the emergency department.  Pager Numbers  - Dr. Nehemiah Massed: [phone]  - Dr. Laurence Ferrari: [phone]  - Dr. Nicole Kindred: [phone]  In the event of inclement weather, please call our main line at [phone] for an update on the status of any delays or closures.  Dermatology Medication Tips: Please keep the boxes that topical medications come in in order to help keep track of the instructions about  where and how to use these. Pharmacies typically print the medication instructions only on the boxes and not directly on the medication tubes.   If your medication is too expensive, please contact our office at [phone] option 4 or send [place] a message through [place].   We are unable to tell what your co-pay for medications will be in advance as this is different depending on your insurance coverage. However, we may be able to find a substitute medication at lower cost or fill out paperwork to get insurance to cover a needed medication.   If a prior authorization is required to get your medication covered by your insurance company, please allow [place] 1-2 business days to complete this process.  Drug prices often vary depending on where the prescription is filled and some pharmacies may offer cheaper prices.  The website www.goodrx.com contains coupons for medications through different pharmacies. The prices here do not account for what the cost may be with help from insurance (it may be cheaper with your insurance), but the website can give you the price if you did not use any insurance.  - You can print the associated coupon and take it with your prescription to the pharmacy.  - You may also stop by our office during regular business hours and pick up a GoodRx coupon card.  - If you need your prescription sent electronically to a different pharmacy, notify our office through [facility] or by phone at [phone] option 4.

## 2021-05-19 NOTE — Progress Notes (Signed)
Follow-Up Visit   Subjective  Abigail Powers is a 80 y.o. female who presents for the following: TBSE (Patient here for full body skin exam and skin cancer screening. Patient with hx of SCC./) and Rash (Patient with itching and bruising all over for 3 weeks after working outside. Patient was given hydroxyzine '25mg'$  TID, TMC cream and prednisone by PCP. ).   The following portions of the chart were reviewed this encounter and updated as appropriate:   Tobacco  Allergies  Meds  Problems  Med Hx  Surg Hx  Fam Hx      Review of Systems:  No other skin or systemic complaints except as noted in HPI or Assessment and Plan.  Objective  Well appearing patient in no apparent distress; mood and affect are within normal limits.  A full examination was performed including scalp, head, eyes, ears, nose, lips, neck, chest, axillae, abdomen, back, buttocks, bilateral upper extremities, bilateral lower extremities, hands, feet, fingers, toes, fingernails, and toenails. All findings within normal limits unless otherwise noted below.  Right Forearm - Anterior Edematous pink plaques at arms and legs   Assessment & Plan  Rash Right Forearm - Anterior  Allergic contact dermatitis vs Bite Reaction Ideology not clear, will consider biopsy if not clearing.   Start clobetasol 0.05% ointment twice daily to itchy spots for up to 2 weeks as needed. Avoid applying to face, groin, and axilla. Use as directed. Risk of skin atrophy with long-term use reviewed.   Given significant symptoms, take prednisone by mouth daily in morning with food starting at 60 mg dosage and gradually decreasing daily dose as directed until gone for a two week taper.  Patient was given written instructions.  Potential side effects reviewed.  Risks of prednisone taper discussed including mood irritability, insomnia, weight gain, stomach ulcers, increased risk of infection, increased blood sugar (diabetes), hypertension,  osteoporosis with long-term or frequent use, and rare risk of avascular necrosis of the hip.   Do not recommend patient continue hydroxyzine. Patient just fell and hit her back on the tub. She denies hitting her head.  Topical steroids (such as triamcinolone, fluocinolone, fluocinonide, mometasone, clobetasol, halobetasol, betamethasone, hydrocortisone) can cause thinning and lightening of the skin if they are used for too long in the same area. Your physician has selected the right strength medicine for your problem and area affected on the body. Please use your medication only as directed by your physician to prevent side effects.    clobetasol ointment (TEMOVATE) 0.05 % - Right Forearm - Anterior Apply 1 application topically 2 (two) times daily. For up to 2 weeks as needed for itch. Avoid applying to face, groin, and axilla. Use as directed. Risk of skin atrophy with long-term use reviewed.  predniSONE (DELTASONE) 5 MG tablet - Right Forearm - Anterior Start 2 week taper. Patient given written instructions.  History of Squamous Cell Carcinoma of the Skin - No evidence of recurrence today at left nasal sidewall - No lymphadenopathy - Recommend regular full body skin exams - Recommend daily broad spectrum sunscreen SPF 30+ to sun-exposed areas, reapply every 2 hours as needed.  - Call if any new or changing lesions are noted between office visits  Lentigines - Scattered tan macules - Due to sun exposure - Benign-appering, observe - Recommend daily broad spectrum sunscreen SPF 30+ to sun-exposed areas, reapply every 2 hours as needed. - Call for any changes  Seborrheic Keratoses - Stuck-on, waxy, tan-brown papules and/or plaques  -  Benign-appearing - Discussed benign etiology and prognosis. - Observe - Call for any changes  Melanocytic Nevi - Tan-brown and/or pink-flesh-colored symmetric macules and papules - Benign appearing on exam today - Observation - Call clinic for new or  changing moles - Recommend daily use of broad spectrum spf 30+ sunscreen to sun-exposed areas.   Hemangiomas - Red papules - Discussed benign nature - Observe - Call for any changes  Actinic Damage - Chronic condition, secondary to cumulative UV/sun exposure - diffuse scaly erythematous macules with underlying dyspigmentation - Recommend daily broad spectrum sunscreen SPF 30+ to sun-exposed areas, reapply every 2 hours as needed.  - Staying in the shade or wearing long sleeves, sun glasses (UVA+UVB protection) and wide brim hats (4-inch brim around the entire circumference of the hat) are also recommended for sun protection.  - Call for new or changing lesions.  Skin cancer screening performed today.  Purpura - Chronic; persistent and recurrent.  Treatable, but not curable. - Violaceous macules and patches - Benign - Related to trauma, age, sun damage and/or use of blood thinners, chronic use of topical and/or oral steroids - Observe - Can use OTC arnica containing moisturizer such as Dermend Bruise Formula if desired - Call for worsening or other concerns    Return in about 4 weeks (around 06/16/2021) for Rash, 6 months for TBSE.  Graciella Belton, RMA, am acting as scribe for Forest Gleason, MD .   Documentation: I have reviewed the above documentation for accuracy and completeness, and I agree with the above.  Forest Gleason, MD

## 2021-05-27 ENCOUNTER — Encounter: Payer: Self-pay | Admitting: Dermatology

## 2021-06-10 ENCOUNTER — Other Ambulatory Visit: Payer: Self-pay | Admitting: Dermatology

## 2021-06-10 DIAGNOSIS — R21 Rash and other nonspecific skin eruption: Secondary | ICD-10-CM

## 2021-06-15 ENCOUNTER — Ambulatory Visit: Payer: Medicare Other | Admitting: Dermatology

## 2021-07-21 ENCOUNTER — Other Ambulatory Visit: Payer: Self-pay

## 2021-07-21 ENCOUNTER — Inpatient Hospital Stay
Admission: EM | Admit: 2021-07-21 | Discharge: 2021-07-23 | DRG: 190 | Disposition: A | Discharge status: Home / Self Care | Payer: Medicare Other | Attending: Student | Admitting: Student

## 2021-07-21 ENCOUNTER — Emergency Department: Payer: Medicare Other

## 2021-07-21 DIAGNOSIS — Z90710 Acquired absence of both cervix and uterus: Secondary | ICD-10-CM

## 2021-07-21 DIAGNOSIS — R8281 Pyuria: Secondary | ICD-10-CM | POA: Diagnosis present

## 2021-07-21 DIAGNOSIS — Y92230 Patient room in hospital as the place of occurrence of the external cause: Secondary | ICD-10-CM | POA: Diagnosis not present

## 2021-07-21 DIAGNOSIS — Z880 Allergy status to penicillin: Secondary | ICD-10-CM

## 2021-07-21 DIAGNOSIS — K219 Gastro-esophageal reflux disease without esophagitis: Secondary | ICD-10-CM | POA: Diagnosis not present

## 2021-07-21 DIAGNOSIS — Z9049 Acquired absence of other specified parts of digestive tract: Secondary | ICD-10-CM

## 2021-07-21 DIAGNOSIS — J440 Chronic obstructive pulmonary disease with acute lower respiratory infection: Principal | ICD-10-CM | POA: Diagnosis present

## 2021-07-21 DIAGNOSIS — J189 Pneumonia, unspecified organism: Secondary | ICD-10-CM | POA: Diagnosis not present

## 2021-07-21 DIAGNOSIS — F419 Anxiety disorder, unspecified: Secondary | ICD-10-CM | POA: Diagnosis not present

## 2021-07-21 DIAGNOSIS — G9341 Metabolic encephalopathy: Secondary | ICD-10-CM | POA: Diagnosis present

## 2021-07-21 DIAGNOSIS — R531 Weakness: Secondary | ICD-10-CM

## 2021-07-21 DIAGNOSIS — F32A Depression, unspecified: Secondary | ICD-10-CM | POA: Diagnosis present

## 2021-07-21 DIAGNOSIS — Y92009 Unspecified place in unspecified non-institutional (private) residence as the place of occurrence of the external cause: Secondary | ICD-10-CM

## 2021-07-21 DIAGNOSIS — Z85828 Personal history of other malignant neoplasm of skin: Secondary | ICD-10-CM

## 2021-07-21 DIAGNOSIS — E785 Hyperlipidemia, unspecified: Secondary | ICD-10-CM | POA: Diagnosis present

## 2021-07-21 DIAGNOSIS — Z7952 Long term (current) use of systemic steroids: Secondary | ICD-10-CM

## 2021-07-21 DIAGNOSIS — Z9181 History of falling: Secondary | ICD-10-CM

## 2021-07-21 DIAGNOSIS — W06XXXA Fall from bed, initial encounter: Secondary | ICD-10-CM | POA: Diagnosis not present

## 2021-07-21 DIAGNOSIS — J441 Chronic obstructive pulmonary disease with (acute) exacerbation: Secondary | ICD-10-CM | POA: Diagnosis present

## 2021-07-21 DIAGNOSIS — Z7951 Long term (current) use of inhaled steroids: Secondary | ICD-10-CM

## 2021-07-21 DIAGNOSIS — M25532 Pain in left wrist: Secondary | ICD-10-CM | POA: Diagnosis not present

## 2021-07-21 DIAGNOSIS — Z20822 Contact with and (suspected) exposure to covid-19: Secondary | ICD-10-CM | POA: Diagnosis present

## 2021-07-21 DIAGNOSIS — E782 Mixed hyperlipidemia: Secondary | ICD-10-CM

## 2021-07-21 DIAGNOSIS — G43909 Migraine, unspecified, not intractable, without status migrainosus: Secondary | ICD-10-CM | POA: Diagnosis present

## 2021-07-21 DIAGNOSIS — W1830XA Fall on same level, unspecified, initial encounter: Secondary | ICD-10-CM | POA: Diagnosis present

## 2021-07-21 DIAGNOSIS — Z79899 Other long term (current) drug therapy: Secondary | ICD-10-CM

## 2021-07-21 LAB — URINALYSIS, ROUTINE W REFLEX MICROSCOPIC
Bacteria, UA: NONE SEEN
Bilirubin Urine: NEGATIVE
Glucose, UA: NEGATIVE mg/dL
Ketones, ur: 20 mg/dL — AB
Nitrite: NEGATIVE
Protein, ur: 30 mg/dL — AB
Specific Gravity, Urine: 1.027 (ref 1.005–1.030)
pH: 5 (ref 5.0–8.0)

## 2021-07-21 LAB — BASIC METABOLIC PANEL
Anion gap: 10 (ref 5–15)
BUN: 15 mg/dL (ref 8–23)
CO2: 26 mmol/L (ref 22–32)
Calcium: 9 mg/dL (ref 8.9–10.3)
Chloride: 98 mmol/L (ref 98–111)
Creatinine, Ser: 0.77 mg/dL (ref 0.44–1.00)
GFR, Estimated: >60 mL/min (ref >60)
Glucose, Bld: 126 mg/dL — ABNORMAL HIGH (ref 70–99)
Potassium: 4 mmol/L (ref 3.5–5.1)
Sodium: 134 mmol/L — ABNORMAL LOW (ref 135–145)

## 2021-07-21 LAB — RESP PANEL BY RT-PCR (FLU A&B, COVID) ARPGX2
Influenza A by PCR: NEGATIVE
Influenza B by PCR: NEGATIVE
SARS Coronavirus 2 by RT PCR: NEGATIVE

## 2021-07-21 LAB — CBC
HCT: 38.6 % (ref 36.0–46.0)
Hemoglobin: 13.2 g/dL (ref 12.0–15.0)
MCH: 33.2 pg (ref 26.0–34.0)
MCHC: 34.2 g/dL (ref 30.0–36.0)
MCV: 97.2 fL (ref 80.0–100.0)
Platelets: 241 10*3/uL (ref 150–400)
RBC: 3.97 MIL/uL (ref 3.87–5.11)
RDW: 13.2 % (ref 11.5–15.5)
WBC: 14.3 10*3/uL — ABNORMAL HIGH (ref 4.0–10.5)
nRBC: 0 % (ref 0.0–0.2)

## 2021-07-21 LAB — TSH: TSH: 1.475 u[IU]/mL (ref 0.350–4.500)

## 2021-07-21 LAB — PROCALCITONIN: Procalcitonin: <0.1 ng/mL

## 2021-07-21 LAB — LACTIC ACID, PLASMA
Lactic Acid, Venous: 1.2 mmol/L (ref 0.5–1.9)
Lactic Acid, Venous: 1 mmol/L (ref 0.5–1.9)

## 2021-07-21 MED ORDER — DICYCLOMINE HCL 10 MG PO CAPS
10.0000 mg | ORAL_CAPSULE | Freq: Three times a day (TID) | ORAL | Status: DC
  Administered 2021-07-22 – 2021-07-23 (×4): 10 mg via ORAL
  Filled 2021-07-21 (×6): qty 1

## 2021-07-21 MED ORDER — ACETAMINOPHEN 650 MG RE SUPP: 650.0000 mg | Freq: Four times a day (QID) | RECTAL | Status: DC | PRN

## 2021-07-21 MED ORDER — LACTATED RINGERS IV SOLN: INTRAVENOUS | Status: DC

## 2021-07-21 MED ORDER — SUMATRIPTAN SUCCINATE 50 MG PO TABS
50.0000 mg | ORAL_TABLET | ORAL | Status: DC | PRN
  Filled 2021-07-21: qty 1

## 2021-07-21 MED ORDER — LEVOFLOXACIN IN D5W 750 MG/150ML IV SOLN: 750.0000 mg | Freq: Once | INTRAVENOUS | Status: DC

## 2021-07-21 MED ORDER — ENOXAPARIN SODIUM 40 MG/0.4ML IJ SOSY
40.0000 mg | PREFILLED_SYRINGE | INTRAMUSCULAR | Status: DC
  Administered 2021-07-21 – 2021-07-22 (×2): 40 mg via SUBCUTANEOUS
  Filled 2021-07-21 (×2): qty 0.4

## 2021-07-21 MED ORDER — TRAZODONE HCL 50 MG PO TABS
50.0000 mg | ORAL_TABLET | Freq: Every day | ORAL | Status: DC
  Administered 2021-07-21 – 2021-07-22 (×2): 50 mg via ORAL
  Filled 2021-07-21 (×2): qty 1

## 2021-07-21 MED ORDER — PANTOPRAZOLE SODIUM 40 MG PO TBEC
40.0000 mg | DELAYED_RELEASE_TABLET | Freq: Every day | ORAL | Status: DC
  Administered 2021-07-22 – 2021-07-23 (×2): 40 mg via ORAL
  Filled 2021-07-21 (×2): qty 1

## 2021-07-21 MED ORDER — LIDOCAINE 5 % EX PTCH
1.0000 | MEDICATED_PATCH | CUTANEOUS | Status: DC
Start: 2021-07-21 — End: 2021-07-23
  Administered 2021-07-21 – 2021-07-22 (×2): 1 via TRANSDERMAL
  Filled 2021-07-21 (×3): qty 1

## 2021-07-21 MED ORDER — GUAIFENESIN ER 600 MG PO TB12
600.0000 mg | ORAL_TABLET | Freq: Two times a day (BID) | ORAL | Status: AC
  Administered 2021-07-21 – 2021-07-23 (×4): 600 mg via ORAL
  Filled 2021-07-21 (×4): qty 1

## 2021-07-21 MED ORDER — ATORVASTATIN CALCIUM 20 MG PO TABS
20.0000 mg | ORAL_TABLET | Freq: Every day | ORAL | Status: DC
  Administered 2021-07-21 – 2021-07-22 (×2): 20 mg via ORAL
  Filled 2021-07-21 (×2): qty 1

## 2021-07-21 MED ORDER — SODIUM CHLORIDE 0.9 % IV SOLN
2.0000 g | INTRAVENOUS | Status: DC
  Administered 2021-07-21 – 2021-07-22 (×2): 2 g via INTRAVENOUS
  Filled 2021-07-21 (×2): qty 20
  Filled 2021-07-21: qty 2

## 2021-07-21 MED ORDER — SODIUM CHLORIDE 0.9 % IV SOLN
500.0000 mg | INTRAVENOUS | Status: DC
  Administered 2021-07-21 – 2021-07-22 (×2): 500 mg via INTRAVENOUS
  Filled 2021-07-21 (×3): qty 500

## 2021-07-21 MED ORDER — ACETAMINOPHEN 325 MG PO TABS: 650.0000 mg | ORAL_TABLET | Freq: Four times a day (QID) | ORAL | Status: DC | PRN

## 2021-07-21 MED ORDER — ONDANSETRON HCL 4 MG PO TABS
4.0000 mg | ORAL_TABLET | Freq: Four times a day (QID) | ORAL | Status: DC | PRN
Start: 2021-07-21 — End: 2021-07-23

## 2021-07-21 MED ORDER — ONDANSETRON HCL 4 MG/2ML IJ SOLN
4.0000 mg | Freq: Four times a day (QID) | INTRAMUSCULAR | Status: DC | PRN
Start: 2021-07-21 — End: 2021-07-23

## 2021-07-21 MED ORDER — ESCITALOPRAM OXALATE 10 MG PO TABS
5.0000 mg | ORAL_TABLET | Freq: Every day | ORAL | Status: DC
  Administered 2021-07-22 – 2021-07-23 (×2): 5 mg via ORAL
  Filled 2021-07-21 (×2): qty 0.5

## 2021-07-21 MED ORDER — HYDROCOD POLST-CPM POLST ER 10-8 MG/5ML PO SUER: 5.0000 mL | Freq: Every evening | ORAL | Status: DC | PRN

## 2021-07-21 NOTE — ED Provider Notes (Signed)
[facility] Emergency Department Provider Note   ____________________________________________   Event Date/Time   First MD Initiated Contact with Patient 07/21/21 1717     (approximate)  I have reviewed the triage vital signs and the nursing notes.   HISTORY  Chief Complaint Weakness    HPI Abigail Powers is a 80 y.o. female who presents complaining of generalized weakness, lightheadedness, cough, and multiple falls for the last week  LOCATION: Generalized DURATION: 1 week prior to arrival TIMING: Worsening since onset SEVERITY: Severe QUALITY: Generalized weakness, productive cough, and multiple falls CONTEXT: Patient states that since last Wednesday she has been having increasing generalized weakness to the point where she cannot get up if she squats down and has been having multiple falls including today when she fell onto her left wrist and hit her forehead on a carpeted floor.  Patient also endorses left-sided chest discomfort and productive cough of green sputum as well as subjective fever/chills over the last week. MODIFYING FACTORS: Denies any specific exacerbating or relieving factors. 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   left nasal sidewall extending to eyelid / mohs surgery complete 08/24/20    Hyperlipidemia     Patient Active Problem List   Diagnosis Date Noted   Lumbar disc disease 03/05/2018   Other cirrhosis of liver ([place]) 12/12/2016   Abdominal pain, epigastric    Abnormal CT of the abdomen 08/23/2016   Constipation 08/23/2016   Abnormal weight loss 08/23/2016   Digital mucous cyst of finger of  left hand 12/31/2015   COPD, mild ([place]) 12/06/2015   Allergic rhinitis 08/30/2015   Anxiety 08/30/2015   Arthritis 08/30/2015   Depression 08/30/2015   Acid reflux 08/30/2015   Hyperglycemia, unspecified 08/30/2015   Hyperlipidemia, unspecified 08/30/2015   Cannot sleep 08/30/2015   Headache, migraine 08/30/2015   OP (osteoporosis) 08/30/2015   Abdominal cramping 08/30/2015   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 Authorizing Provider  atorvastatin (LIPITOR) 20 MG tablet Take by mouth. 01/28/20   [provider]  azelastine (ASTELIN) 0.1 % nasal spray  05/29/19   [provider]  budesonide-formoterol (SYMBICORT) 160-4.5 MCG/ACT inhaler Inhale 2 puffs into the lungs 2 (two) times daily.    [provider]  buPROPion [facility]) 100 MG tablet  05/29/19   [provider]  buPROPion [facility]) 75 MG tablet  05/29/19   [provider]  busPIRone (BUSPAR) 5 MG tablet  05/29/19   [provider]  Calcium Carb-Ergocalciferol 250-125 MG-UNIT TABS Take by mouth.    [provider]  cetirizine (ZYRTEC) 10 MG tablet Take by mouth.    [provider]  Cholecalciferol (VITAMIN D3) 25 MCG (1000 UT) CAPS Take by mouth.    [provider]  clindamycin (CLEOCIN) 150 MG capsule Take 1 capsule (150 mg total) by mouth 3 (three) times daily. 09/17/19   Hyatt, Max T, DPM  clobetasol ointment (TEMOVATE) 9.92 % Apply 1  application topically 2 (two) times daily. For up to 2 weeks as needed for itch. Avoid applying to face, groin, and axilla. Use as directed. Risk of skin atrophy with long-term use reviewed. 05/19/21   Moye, [place], MD  clonazePAM (KLONOPIN) 1 MG tablet Take 1 mg by mouth at bedtime as needed. 06/30/19   [provider]  diclofenac (VOLTAREN) 50 MG EC tablet  08/20/16   [provider]  dicyclomine (BENTYL) 10 MG capsule TAKE (1) CAPSULE BY MOUTH FOUR TIMES A DAY BEFORE MEALS AND AT BEDTIME 11/11/18   Jonathon Bellows, MD  doxycycline (MONODOX) 100 MG capsule Take 100 mg by mouth 2 (two) times daily. Patient not taking: No sig reported    [provider]  escitalopram (LEXAPRO) 5 MG tablet Take 5 mg by mouth daily.    [provider]  etodolac (LODINE) 400 MG tablet Take 400 mg by mouth 2 (two) times daily.    [provider]  gabapentin (NEURONTIN) 100 MG capsule  05/29/19   [provider]  gentamicin cream (GARAMYCIN) 0.1 % Apply 1 application topically 2 (two) times daily. Patient not taking: Reported on 05/19/2021 09/23/19   Edrick Kins, DPM  hydrocortisone (ANUSOL-HC) 2.5 % rectal cream  05/29/19   [provider]  hydrocortisone 2.5 % cream  02/11/18   [provider]  magnesium oxide (MAG-OX) 400 MG tablet Take by mouth.    [provider]  meclizine (ANTIVERT) 25 MG tablet  07/08/19   [provider]  meloxicam (MOBIC) 7.5 MG tablet Take 7.5 mg by mouth daily. 05/06/19   [provider]  mupirocin ointment (BACTROBAN) 2 % Apply to wound twice a day. Patient not taking: Reported on 05/19/2021 09/03/19   Tyson Dense T, DPM  pantoprazole (PROTONIX) 40 MG tablet  10/24/18   [provider]  polyethylene glycol powder (GLYCOLAX/MIRALAX) 17 GM/SCOOP powder Take 1 Container by mouth once.    [provider]  predniSONE (DELTASONE) 10 MG tablet  11/04/18   [provider]  predniSONE  (DELTASONE) 5 MG tablet Start 2 week taper. Patient given written instructions. 05/19/21   Moye, [place], MD  Psyllium 48.57 % POWD Take by mouth.    [provider]  rOPINIRole (REQUIP) 0.5 MG tablet Take 0.5 mg by mouth at bedtime.    [provider]  sucralfate (CARAFATE) 1 g tablet Take by mouth. 05/04/20 05/04/21  [provider]  SUMAtriptan (IMITREX) 50 MG tablet Take 50 mg by mouth every 2 (two) hours as needed for migraine. May repeat in 2 hours if headache persists or recurs.    [provider]  traZODone (DESYREL) 50 MG tablet Take 50 mg by mouth at bedtime.    [provider]  VENTOLIN HFA [phone] Base) MCG/ACT inhaler  05/29/19   [provider]  vitamin B-12 (CYANOCOBALAMIN) 1000 MCG tablet Take by mouth.    [provider]    Allergies Penicillins  Family History  Problem Relation Age of Onset   Heart disease Father    Breast cancer Neg Hx     Social History Social History   Tobacco Use   Smoking status: Never  Smokeless tobacco: Never  Vaping Use   Vaping Use: Never used  Substance Use Topics   Alcohol use: No   Drug use: No    Review of Systems Constitutional: No fever/chills Eyes: No visual changes. ENT: No sore throat. Cardiovascular: Endorses left-sided chest pain. Respiratory: Endorses productive cough with green sputum.  Denies shortness of breath. Gastrointestinal: No abdominal pain.  No nausea, no vomiting.  No diarrhea. Genitourinary: Negative for dysuria. Musculoskeletal: Negative for acute arthralgias Skin: Negative for rash. Neurological: Positive for generalized weakness.  Negative for headaches, numbness/paresthesias in any extremity Psychiatric: Negative for suicidal ideation/homicidal ideation   ____________________________________________   PHYSICAL EXAM:  VITAL SIGNS: ED Triage Vitals [07/21/21 1406]  Enc Vitals Group     BP 130/68     Pulse Rate 87     Resp 16      Temp 99 F (37.2 C)     Temp Source Oral     SpO2 98 %     Weight 110 lb (49.9 kg)     Height 5\' 5"  (1.651 m)     Head Circumference      Peak Flow      Pain Score 8     Pain Loc      Pain Edu?      Excl. in [place]?    Constitutional: Alert and oriented. Well appearing and in no acute distress. Eyes: Conjunctivae are normal. PERRL. Head: Atraumatic. Nose: No congestion/rhinnorhea. Mouth/Throat: Mucous membranes are moist. Neck: No stridor Cardiovascular: Grossly normal heart sounds.  Good peripheral circulation. Respiratory: Mild rhonchi over bilateral lower lung fields.  Normal respiratory effort.  No retractions. Gastrointestinal: Soft and nontender. No distention. Musculoskeletal: No obvious deformities Neurologic:  Normal speech and language. No gross focal neurologic deficits are appreciated. Skin:  Skin is warm and dry. No rash noted. Psychiatric: Mood and affect are normal. Speech and behavior are normal.  ____________________________________________   LABS (all labs ordered are listed, but only abnormal results are displayed)  Labs Reviewed  BASIC METABOLIC PANEL - Abnormal; Notable for the following components:      Result Value   Sodium 134 (*)    Glucose, Bld 126 (*)    All other components within normal limits  CBC - Abnormal; Notable for the following components:   WBC 14.3 (*)    All other components within normal limits  URINALYSIS, ROUTINE W REFLEX MICROSCOPIC - Abnormal; Notable for the following components:   Color, Urine AMBER (*)    APPearance HAZY (*)    Hgb urine dipstick MODERATE (*)    Ketones, ur 20 (*)    Protein, ur 30 (*)    Leukocytes,Ua LARGE (*)    All other components within normal limits  RESP PANEL BY RT-PCR (FLU A&B, COVID) ARPGX2  CULTURE, BLOOD (ROUTINE X 2)  CULTURE, BLOOD (ROUTINE X 2)  LACTIC ACID, PLASMA  LACTIC ACID, PLASMA  CBG MONITORING, ED   ____________________________________________  EKG  ED ECG REPORT I, Naaman Plummer, the attending physician, personally viewed and interpreted this ECG.  Date: 07/21/2021 EKG Time: 1408 Rate: 84 Rhythm: normal sinus rhythm QRS Axis: normal Intervals: normal ST/T Wave abnormalities: normal Narrative Interpretation: no evidence of acute ischemia  ____________________________________________  RADIOLOGY  ED MD interpretation: 2 view chest x-ray shows opacity in the lateral right midlung as well as streaky left basilar opacity  X-ray of the left wrist does not show any evidence of fracture or dislocation  CT of the head without  contrast shows no evidence of acute abnormalities including no intracerebral hemorrhage, obvious masses, or significant edema  CT of the cervical spine does not show any evidence of acute abnormalities including no acute fracture, malalignment, height loss, or dislocation  Official radiology report(s): DG Chest 2 View  Result Date: 07/21/2021 CLINICAL DATA:  sob EXAM: CHEST - 2 VIEW COMPARISON:  Feb 24, 2021. FINDINGS: The heart size and mediastinal contours are within normal limits. Ill-defined opacity in the lateral right midlung. There could be a small amount fluid tracking along the fissure in this region. Streaky left basilar opacities. No visible pneumothorax. Reverse S-shaped thoracolumbar curvature with degenerative change. Cholecystectomy clips. Remote right rib fractures. IMPRESSION: 1. Ill-defined opacity in the lateral right midlung, possibly pneumonia. There could be a small amount fluid tracking along the fissure in this region. Followup PA and lateral chest X-ray is recommended in 3-4 weeks following trial of antibiotic therapy to ensure resolution and exclude underlying malignancy. 2. Streaky left basilar opacities, atelectasis versus pneumonia. Electronically Signed   By: Margaretha Sheffield M.D.   On: 07/21/2021 14:58   DG Wrist Complete Left  Result Date: 07/21/2021 CLINICAL DATA:  Fall, left wrist pain EXAM: LEFT WRIST -  COMPLETE 3+ VIEW COMPARISON:  None. FINDINGS: No evidence of acute fracture. No malalignment. Severe degenerative changes of the first carpometacarpal joint with slight radial subluxation. Bones are mildly demineralized. No focal soft tissue swelling. IMPRESSION: No acute fracture or dislocation of the left wrist. Electronically Signed   By: Davina Poke D.O.   On: 07/21/2021 14:56   CT HEAD WO CONTRAST  Result Date: 07/21/2021 CLINICAL DATA:  Head trauma, minor. Neck trauma. Additional history provided: Weakness in left wrist pain with multiple recent falls, including a fall this morning. EXAM: CT HEAD WITHOUT CONTRAST CT CERVICAL SPINE WITHOUT CONTRAST TECHNIQUE: Multidetector CT imaging of the head and cervical spine was performed following the standard protocol without intravenous contrast. Multiplanar CT image reconstructions of the cervical spine were also generated. COMPARISON:  Brain MRI 01/01/2017. Head CT 10/28/2010. Report from radiographs of the cervical spine 05/01/2019 (images unavailable). FINDINGS: CT HEAD FINDINGS Brain: Mild generalized cerebral atrophy. Minimal patchy and ill-defined hypoattenuation within the cerebral white matter, nonspecific but compatible with chronic small vessel ischemic disease. There is no acute intracranial hemorrhage. No demarcated cortical infarct. No extra-axial fluid collection. No evidence of an intracranial mass. No midline shift. Vascular: No hyperdense vessel.  Atherosclerotic calcifications. Skull: Normal. Negative for fracture or focal lesion. Sinuses/Orbits: Visualized orbits show no acute finding. Small volume frothy secretions within the right sphenoid sinus. Other: Left mastoid effusion. CT CERVICAL SPINE FINDINGS Alignment: Straightening of the expected cervical lordosis. Cervicothoracic dextrocurvature with partially imaged thoracic levocurvature more inferiorly. Trace C3-C4 grade 1 retrolisthesis. Trace C4-C5 grade 1 anterolisthesis. Trace C5-C6  grade 1 retrolisthesis. Trace C7-T1 grade 1 anterolisthesis. Skull base and vertebrae: The basion-dental and atlanto-dental intervals are maintained.No evidence of acute fracture to the cervical spine. Soft tissues and spinal canal: No prevertebral fluid or swelling. No visible canal hematoma. Disc levels: Cervical spondylosis with multilevel disc space narrowing, disc bulges, uncovertebral hypertrophy and facet arthrosis. Additionally, a posterior disc osteophyte complex is present at C5-C6. Disc space narrowing is greatest at C4-C5 (moderate/advanced), C5-C6 (advanced) and C6-C7 (moderate). Multilevel spinal canal stenosis. Most notably, there is suspected moderate spinal canal stenosis at C5-C6. Multilevel bony neural foraminal narrowing, greatest on the left at C4-C5 and bilaterally at C5-C6. Calcified central disc protrusion at T1-T2 resulting in mild relative  spinal canal narrowing. Upper chest: No consolidation within the imaged lung apices. No visible pneumothorax. Smooth interlobular septal thickening within the imaged lung apices, nonspecific but possibly reflecting interstitial edema. IMPRESSION: CT head: 1. No evidence of acute intracranial abnormality. 2. Minimal chronic small vessel ischemic changes within the cerebral white matter. 3. Mild generalized cerebral atrophy. 4. Left mastoid effusion. CT cervical spine: 1. No evidence of acute fracture to the cervical spine. 2. Straightening of the expected cervical lordosis. 3. Cervicothoracic dextrocurvature with partially imaged thoracic levocurvature more inferiorly. 4. Mild grade 1 spondylolisthesis at C3-C4, C4-C5, C5-C6 and C7-T1, as detailed. 5. Cervical spondylosis, as outlined. Multilevel spinal canal stenosis. Most notably, there is apparent moderate spinal canal stenosis at C5-C6. Multilevel bony neural foraminal narrowing. 6. Smooth interlobular septal thickening within the imaged lung apices, nonspecific but possibly reflecting interstitial  edema. Electronically Signed   By: Kellie Simmering D.O.   On: 07/21/2021 15:41   CT Cervical Spine Wo Contrast  Result Date: 07/21/2021 CLINICAL DATA:  Head trauma, minor. Neck trauma. Additional history provided: Weakness in left wrist pain with multiple recent falls, including a fall this morning. EXAM: CT HEAD WITHOUT CONTRAST CT CERVICAL SPINE WITHOUT CONTRAST TECHNIQUE: Multidetector CT imaging of the head and cervical spine was performed following the standard protocol without intravenous contrast. Multiplanar CT image reconstructions of the cervical spine were also generated. COMPARISON:  Brain MRI 01/01/2017. Head CT 10/28/2010. Report from radiographs of the cervical spine 05/01/2019 (images unavailable). FINDINGS: CT HEAD FINDINGS Brain: Mild generalized cerebral atrophy. Minimal patchy and ill-defined hypoattenuation within the cerebral white matter, nonspecific but compatible with chronic small vessel ischemic disease. There is no acute intracranial hemorrhage. No demarcated cortical infarct. No extra-axial fluid collection. No evidence of an intracranial mass. No midline shift. Vascular: No hyperdense vessel.  Atherosclerotic calcifications. Skull: Normal. Negative for fracture or focal lesion. Sinuses/Orbits: Visualized orbits show no acute finding. Small volume frothy secretions within the right sphenoid sinus. Other: Left mastoid effusion. CT CERVICAL SPINE FINDINGS Alignment: Straightening of the expected cervical lordosis. Cervicothoracic dextrocurvature with partially imaged thoracic levocurvature more inferiorly. Trace C3-C4 grade 1 retrolisthesis. Trace C4-C5 grade 1 anterolisthesis. Trace C5-C6 grade 1 retrolisthesis. Trace C7-T1 grade 1 anterolisthesis. Skull base and vertebrae: The basion-dental and atlanto-dental intervals are maintained.No evidence of acute fracture to the cervical spine. Soft tissues and spinal canal: No prevertebral fluid or swelling. No visible canal hematoma. Disc  levels: Cervical spondylosis with multilevel disc space narrowing, disc bulges, uncovertebral hypertrophy and facet arthrosis. Additionally, a posterior disc osteophyte complex is present at C5-C6. Disc space narrowing is greatest at C4-C5 (moderate/advanced), C5-C6 (advanced) and C6-C7 (moderate). Multilevel spinal canal stenosis. Most notably, there is suspected moderate spinal canal stenosis at C5-C6. Multilevel bony neural foraminal narrowing, greatest on the left at C4-C5 and bilaterally at C5-C6. Calcified central disc protrusion at T1-T2 resulting in mild relative spinal canal narrowing. Upper chest: No consolidation within the imaged lung apices. No visible pneumothorax. Smooth interlobular septal thickening within the imaged lung apices, nonspecific but possibly reflecting interstitial edema. IMPRESSION: CT head: 1. No evidence of acute intracranial abnormality. 2. Minimal chronic small vessel ischemic changes within the cerebral white matter. 3. Mild generalized cerebral atrophy. 4. Left mastoid effusion. CT cervical spine: 1. No evidence of acute fracture to the cervical spine. 2. Straightening of the expected cervical lordosis. 3. Cervicothoracic dextrocurvature with partially imaged thoracic levocurvature more inferiorly. 4. Mild grade 1 spondylolisthesis at C3-C4, C4-C5, C5-C6 and C7-T1, as detailed. 5. Cervical spondylosis,  as outlined. Multilevel spinal canal stenosis. Most notably, there is apparent moderate spinal canal stenosis at C5-C6. Multilevel bony neural foraminal narrowing. 6. Smooth interlobular septal thickening within the imaged lung apices, nonspecific but possibly reflecting interstitial edema. Electronically Signed   By: Kellie Simmering D.O.   On: 07/21/2021 15:41    ____________________________________________   PROCEDURES  Procedure(s) performed (including Critical Care):  .1-3 Lead EKG Interpretation Performed by: Naaman Plummer, MD Authorized by: Naaman Plummer, MD      Interpretation: normal     ECG rate:  88   ECG rate assessment: normal     Rhythm: sinus rhythm     Ectopy: none     Conduction: normal     ____________________________________________   INITIAL IMPRESSION / ASSESSMENT AND PLAN / ED COURSE  As part of my medical decision making, I reviewed the following data within the electronic medical record, if available:  Nursing notes reviewed and incorporated, Labs reviewed, EKG interpreted, Old chart reviewed, Radiograph reviewed and Notes from prior ED visits reviewed and incorporated        Presents with subjective fever/chills, shortness of breath, cough, and malaise concerning for pneumonia.  Patient also suffering from multiple falls due to worsening weakness  DDx: PE, COPD exacerbation, Pneumothorax, TB, Atypical ACS, Esophageal Rupture, Toxic Exposure, Foreign Body Airway Obstruction.  Workup: CXR CBC, CMP, lactate, troponin  Given History, Exam, and Workup presentation most consistent with pneumonia.  Findings: Right-sided midlung opacity and left lower lobe consolidation on chest x-ray  Tx: Levofloxacin  1756 Reassessment: Patient's respiratory status is stable however she does continue to endorse significant generalized weakness and given falls at home would be unsafe for discharge at this time.  Disposition: Admit      ____________________________________________   FINAL CLINICAL IMPRESSION(S) / ED DIAGNOSES  Final diagnoses:  Community acquired pneumonia of right middle lobe of lung  Generalized weakness     ED Discharge Orders     None        Note:  This document was prepared using Dragon voice recognition software and may include unintentional dictation errors.    Naaman Plummer, MD 07/21/21 [phone]

## 2021-07-21 NOTE — H&P (Signed)
History and Physical   Abigail Powers WGN:[id number] DOB: 1941/02/12 DOA: 07/21/2021  PCP: Rusty Aus, MD  Outpatient Specialists: Dr. Manley Mason, [facility] Patient coming from: Home  I have personally briefly reviewed patient's old medical records in [place].  Chief Concern: Weakness and fall   HPI: Abigail Powers is a 80 y.o. female with medical history significant for COPD, GERD, history of squamous cell carcinoma of the skin, hyperlipidemia, depression, anxiety, migraine headaches, who presents emergency department for chief concerns of weakness and frequent fall.  She reports that she woke up 07/20/21 with cough that is productive of green sputum.  She endorses left-sided breast pain with each cough.  She denies known sick contacts.   At bedside patient is able to tell me her name, age, location of hospital.  She states that she normally ambulates without a walker and/or cane.  She is able to perform her own ADLs.  Her husband does endorse in the last 1 to 2 weeks she has been having mild confusion.  He also had endorses that she has had increased weakness and frequent falling.  Social history: She lives with her husband. She denies tobacco, etoh, recreational drug use. She is retired and formerly worked for [employer] history: She has received 4 doses of Pfizer   ROS: Constitutional: no weight change, no fever ENT/Mouth: no sore throat, no rhinorrhea Eyes: no eye pain, no vision changes Cardiovascular: no chest pain, no dyspnea,  no edema, no palpitations Respiratory: no cough, no sputum, no wheezing Gastrointestinal: no nausea, no vomiting, no diarrhea, no constipation Genitourinary: no urinary incontinence, no dysuria, no hematuria Musculoskeletal: no arthralgias, no myalgias Skin: no skin lesions, no pruritus, Neuro: + weakness, no loss of consciousness, no syncope Psych: no anxiety, no depression, + decrease  appetite Heme/Lymph: no bruising, no bleeding  ED Course: Discussed with emergency medicine provider, patient requiring hospitalization for chief concerns of weakness presumed secondary to pneumonia.  Vitals in the emergency department was remarkable for temperature of 99, respiration rate of 16, heart rate of 87, blood pressure 130/68, SPO2 of 98% on room air.  Labs in the emergency department was remarkable for sodium 134, potassium 4.0, chloride 98, bicarb 26, BUN of 15, serum creatinine of 0.77, nonfasting blood glucose 126, WBC 14.3, hemoglobin 13.2, platelets 241.  In the emergency department ED provider ordered lactated ringer 150 mL/h and levofloxacin IV.  COVID/influenza A/influenza B PCR are in progress  Assessment/Plan  Principal Problem:   PNA (pneumonia) Active Problems:   Anxiety   Depression   Acid reflux   Hyperlipidemia, unspecified   # Weakness presumed secondary to right middle lobe pneumonia # Patient meets sepsis criteria with elevated respiration rate, leukocytosis, source of pneumonia versus urine - Check procalcitonin for baseline - Blood cultures x2 - Azithromycin and ceftriaxone - Supportive care: Mucinex twice daily, Tussionex 5 mL p.o. nightly as needed for cough, 3 doses ordered - CBC, BMP in a.m.  # Generalized weakness-checking B12, B1, vitamin D, TSH - PT ordered  # Left-sided rib pain-suspect musculoskeletal secondary to cough, lidocaine patch ordered  # Pyuria-present on admission  # Hyperlipidemia-resumed atorvastatin 20 mg nightly  # Depression/anxiety-resumed escitalopram 5 mg daily and trazodone 50 mg nightly  # Migraine headaches-resumed home sumatriptan 50 mg p.o. every 2 hours as needed for migraines  # GERD-PPI  Chart reviewed.   DVT prophylaxis: Enoxaparin 40 mg subcutaneous every 24 hours Code Status: full code Diet: Heart  healthy Family Communication: Updated her husband at bedside with patient's permission Disposition  Plan: Pending clinical course Consults called: None at this time Admission status: MedSurg, observation, telemetry  Past Medical History:  Diagnosis Date   Asthma    COPD (chronic obstructive pulmonary disease) ([place])    GERD (gastroesophageal reflux disease)    History of SCC (squamous cell carcinoma) of skin 06/23/2020   left nasal sidewall extending to eyelid / mohs surgery complete 08/24/20    Hyperlipidemia    Past Surgical History:  Procedure Laterality Date   ABDOMINAL HYSTERECTOMY     APPENDECTOMY     CHOLECYSTECTOMY     ESOPHAGOGASTRODUODENOSCOPY (EGD) WITH PROPOFOL N/A 08/31/2016   Procedure: ESOPHAGOGASTRODUODENOSCOPY (EGD) WITH PROPOFOL;  Surgeon: Jonathon Bellows, MD;  Location: [facility] ENDOSCOPY;  Service: Endoscopy;  Laterality: N/A;   Social History:  reports that she has never smoked. She has never used smokeless tobacco. She reports that she does not drink alcohol and does not use drugs.  Allergies  Allergen Reactions   Penicillins Rash   Family History  Problem Relation Age of Onset   Heart disease Father    Breast cancer Neg Hx    Family history: Family history reviewed and not pertinent  Prior to Admission medications   Medication Sig Start Date End Date Taking? Authorizing Provider  atorvastatin (LIPITOR) 20 MG tablet Take by mouth. 01/28/20   [provider]  azelastine (ASTELIN) 0.1 % nasal spray  05/29/19   [provider]  budesonide-formoterol (SYMBICORT) 160-4.5 MCG/ACT inhaler Inhale 2 puffs into the lungs 2 (two) times daily.    [provider]  buPROPion [facility]) 100 MG tablet  05/29/19   [provider]  buPROPion [facility]) 75 MG tablet  05/29/19   [provider]  busPIRone (BUSPAR) 5 MG tablet  05/29/19   [provider]  Calcium Carb-Ergocalciferol 250-125 MG-UNIT TABS Take by mouth.    [provider]  cetirizine (ZYRTEC) 10 MG tablet Take by mouth.    [provider]   Cholecalciferol (VITAMIN D3) 25 MCG (1000 UT) CAPS Take by mouth.    [provider]  clindamycin (CLEOCIN) 150 MG capsule Take 1 capsule (150 mg total) by mouth 3 (three) times daily. 09/17/19   Hyatt, Max T, DPM  clobetasol ointment (TEMOVATE) 7.67 % Apply 1 application topically 2 (two) times daily. For up to 2 weeks as needed for itch. Avoid applying to face, groin, and axilla. Use as directed. Risk of skin atrophy with long-term use reviewed. 05/19/21   Moye, [place], MD  clonazePAM (KLONOPIN) 1 MG tablet Take 1 mg by mouth at bedtime as needed. 06/30/19   [provider]  diclofenac (VOLTAREN) 50 MG EC tablet  08/20/16   [provider]  dicyclomine (BENTYL) 10 MG capsule TAKE (1) CAPSULE BY MOUTH FOUR TIMES A DAY BEFORE MEALS AND AT BEDTIME 11/11/18   Jonathon Bellows, MD  doxycycline (MONODOX) 100 MG capsule Take 100 mg by mouth 2 (two) times daily. Patient not taking: No sig reported    [provider]  escitalopram (LEXAPRO) 5 MG tablet Take 5 mg by mouth daily.    [provider]  etodolac (LODINE) 400 MG tablet Take 400 mg by mouth 2 (two) times daily.    [provider]  gabapentin (NEURONTIN) 100 MG capsule  05/29/19   [provider]  gentamicin cream (GARAMYCIN) 0.1 % Apply 1 application topically 2 (two) times daily. Patient not taking: Reported on 05/19/2021 09/23/19  Daylene Katayama M, DPM  hydrocortisone (ANUSOL-HC) 2.5 % rectal cream  05/29/19   [provider]  hydrocortisone 2.5 % cream  02/11/18   [provider]  magnesium oxide (MAG-OX) 400 MG tablet Take by mouth.    [provider]  meclizine (ANTIVERT) 25 MG tablet  07/08/19   [provider]  meloxicam (MOBIC) 7.5 MG tablet Take 7.5 mg by mouth daily. 05/06/19   [provider]  mupirocin ointment (BACTROBAN) 2 % Apply to wound twice a day. Patient not taking: Reported on 05/19/2021 09/03/19   Tyson Dense T, DPM  pantoprazole  (PROTONIX) 40 MG tablet  10/24/18   [provider]  polyethylene glycol powder (GLYCOLAX/MIRALAX) 17 GM/SCOOP powder Take 1 Container by mouth once.    [provider]  predniSONE (DELTASONE) 10 MG tablet  11/04/18   [provider]  predniSONE (DELTASONE) 5 MG tablet Start 2 week taper. Patient given written instructions. 05/19/21   Moye, [place], MD  Psyllium 48.57 % POWD Take by mouth.    [provider]  rOPINIRole (REQUIP) 0.5 MG tablet Take 0.5 mg by mouth at bedtime.    [provider]  sucralfate (CARAFATE) 1 g tablet Take by mouth. 05/04/20 05/04/21  [provider]  SUMAtriptan (IMITREX) 50 MG tablet Take 50 mg by mouth every 2 (two) hours as needed for migraine. May repeat in 2 hours if headache persists or recurs.    [provider]  traZODone (DESYREL) 50 MG tablet Take 50 mg by mouth at bedtime.    [provider]  VENTOLIN HFA 108 [phone] Base) MCG/ACT inhaler  05/29/19   [provider]  vitamin B-12 (CYANOCOBALAMIN) 1000 MCG tablet Take by mouth.    [provider]   Physical Exam: Vitals:   07/21/21 1406  BP: 130/68  Pulse: 87  Resp: 16  Temp: 99 F (37.2 C)  TempSrc: Oral  SpO2: 98%  Weight: 49.9 kg  Height: 5\' 5"  (1.651 m)   Constitutional: appears age-appropriate, NAD, calm, comfortable Eyes: PERRL, lids and conjunctivae normal ENMT: Mucous membranes are moist. Posterior pharynx clear of any exudate or lesions. Age-appropriate dentition. Hearing appropriate Neck: normal, supple, no masses, no thyromegaly Respiratory: clear to auscultation bilaterally, no wheezing, no crackles. Normal respiratory effort. No accessory muscle use.  Cardiovascular: Regular rate and rhythm, no murmurs / rubs / gallops. No extremity edema. 2+ pedal pulses. No carotid bruits.  Abdomen: no tenderness, no masses palpated, no hepatosplenomegaly. Bowel sounds positive.  Musculoskeletal: no clubbing / cyanosis. No  joint deformity upper and lower extremities. Good ROM, no contractures, no atrophy. Normal muscle tone.  Skin: no rashes, lesions, ulcers. No induration Neurologic: Sensation intact. Strength 5/5 in all 4.  Psychiatric: Normal judgment and insight. Alert and oriented x 3. Normal mood.   EKG: independently reviewed, showing sinus rhythm with rate of 84, QTc 406  Chest x-ray on Admission: I personally reviewed and I agree with radiologist reading as below.  DG Chest 2 View  Result Date: 07/21/2021 CLINICAL DATA:  sob EXAM: CHEST - 2 VIEW COMPARISON:  Feb 24, 2021. FINDINGS: The heart size and mediastinal contours are within normal limits. Ill-defined opacity in the lateral right midlung. There could be a small amount fluid tracking along the fissure in this region. Streaky left basilar opacities. No visible pneumothorax. Reverse S-shaped thoracolumbar curvature with degenerative change. Cholecystectomy clips. Remote right rib fractures. IMPRESSION: 1. Ill-defined opacity in the lateral right midlung, possibly pneumonia. There could be  a small amount fluid tracking along the fissure in this region. Followup PA and lateral chest X-ray is recommended in 3-4 weeks following trial of antibiotic therapy to ensure resolution and exclude underlying malignancy. 2. Streaky left basilar opacities, atelectasis versus pneumonia. Electronically Signed   By: Margaretha Sheffield M.D.   On: 07/21/2021 14:58   DG Wrist Complete Left  Result Date: 07/21/2021 CLINICAL DATA:  Fall, left wrist pain EXAM: LEFT WRIST - COMPLETE 3+ VIEW COMPARISON:  None. FINDINGS: No evidence of acute fracture. No malalignment. Severe degenerative changes of the first carpometacarpal joint with slight radial subluxation. Bones are mildly demineralized. No focal soft tissue swelling. IMPRESSION: No acute fracture or dislocation of the left wrist. Electronically Signed   By: Davina Poke D.O.   On: 07/21/2021 14:56   CT HEAD WO  CONTRAST  Result Date: 07/21/2021 CLINICAL DATA:  Head trauma, minor. Neck trauma. Additional history provided: Weakness in left wrist pain with multiple recent falls, including a fall this morning. EXAM: CT HEAD WITHOUT CONTRAST CT CERVICAL SPINE WITHOUT CONTRAST TECHNIQUE: Multidetector CT imaging of the head and cervical spine was performed following the standard protocol without intravenous contrast. Multiplanar CT image reconstructions of the cervical spine were also generated. COMPARISON:  Brain MRI 01/01/2017. Head CT 10/28/2010. Report from radiographs of the cervical spine 05/01/2019 (images unavailable). FINDINGS: CT HEAD FINDINGS Brain: Mild generalized cerebral atrophy. Minimal patchy and ill-defined hypoattenuation within the cerebral white matter, nonspecific but compatible with chronic small vessel ischemic disease. There is no acute intracranial hemorrhage. No demarcated cortical infarct. No extra-axial fluid collection. No evidence of an intracranial mass. No midline shift. Vascular: No hyperdense vessel.  Atherosclerotic calcifications. Skull: Normal. Negative for fracture or focal lesion. Sinuses/Orbits: Visualized orbits show no acute finding. Small volume frothy secretions within the right sphenoid sinus. Other: Left mastoid effusion. CT CERVICAL SPINE FINDINGS Alignment: Straightening of the expected cervical lordosis. Cervicothoracic dextrocurvature with partially imaged thoracic levocurvature more inferiorly. Trace C3-C4 grade 1 retrolisthesis. Trace C4-C5 grade 1 anterolisthesis. Trace C5-C6 grade 1 retrolisthesis. Trace C7-T1 grade 1 anterolisthesis. Skull base and vertebrae: The basion-dental and atlanto-dental intervals are maintained.No evidence of acute fracture to the cervical spine. Soft tissues and spinal canal: No prevertebral fluid or swelling. No visible canal hematoma. Disc levels: Cervical spondylosis with multilevel disc space narrowing, disc bulges, uncovertebral  hypertrophy and facet arthrosis. Additionally, a posterior disc osteophyte complex is present at C5-C6. Disc space narrowing is greatest at C4-C5 (moderate/advanced), C5-C6 (advanced) and C6-C7 (moderate). Multilevel spinal canal stenosis. Most notably, there is suspected moderate spinal canal stenosis at C5-C6. Multilevel bony neural foraminal narrowing, greatest on the left at C4-C5 and bilaterally at C5-C6. Calcified central disc protrusion at T1-T2 resulting in mild relative spinal canal narrowing. Upper chest: No consolidation within the imaged lung apices. No visible pneumothorax. Smooth interlobular septal thickening within the imaged lung apices, nonspecific but possibly reflecting interstitial edema. IMPRESSION: CT head: 1. No evidence of acute intracranial abnormality. 2. Minimal chronic small vessel ischemic changes within the cerebral white matter. 3. Mild generalized cerebral atrophy. 4. Left mastoid effusion. CT cervical spine: 1. No evidence of acute fracture to the cervical spine. 2. Straightening of the expected cervical lordosis. 3. Cervicothoracic dextrocurvature with partially imaged thoracic levocurvature more inferiorly. 4. Mild grade 1 spondylolisthesis at C3-C4, C4-C5, C5-C6 and C7-T1, as detailed. 5. Cervical spondylosis, as outlined. Multilevel spinal canal stenosis. Most notably, there is apparent moderate spinal canal stenosis at C5-C6. Multilevel bony neural foraminal narrowing. 6. Smooth  interlobular septal thickening within the imaged lung apices, nonspecific but possibly reflecting interstitial edema. Electronically Signed   By: Kellie Simmering D.O.   On: 07/21/2021 15:41   CT Cervical Spine Wo Contrast  Result Date: 07/21/2021 CLINICAL DATA:  Head trauma, minor. Neck trauma. Additional history provided: Weakness in left wrist pain with multiple recent falls, including a fall this morning. EXAM: CT HEAD WITHOUT CONTRAST CT CERVICAL SPINE WITHOUT CONTRAST TECHNIQUE: Multidetector CT  imaging of the head and cervical spine was performed following the standard protocol without intravenous contrast. Multiplanar CT image reconstructions of the cervical spine were also generated. COMPARISON:  Brain MRI 01/01/2017. Head CT 10/28/2010. Report from radiographs of the cervical spine 05/01/2019 (images unavailable). FINDINGS: CT HEAD FINDINGS Brain: Mild generalized cerebral atrophy. Minimal patchy and ill-defined hypoattenuation within the cerebral white matter, nonspecific but compatible with chronic small vessel ischemic disease. There is no acute intracranial hemorrhage. No demarcated cortical infarct. No extra-axial fluid collection. No evidence of an intracranial mass. No midline shift. Vascular: No hyperdense vessel.  Atherosclerotic calcifications. Skull: Normal. Negative for fracture or focal lesion. Sinuses/Orbits: Visualized orbits show no acute finding. Small volume frothy secretions within the right sphenoid sinus. Other: Left mastoid effusion. CT CERVICAL SPINE FINDINGS Alignment: Straightening of the expected cervical lordosis. Cervicothoracic dextrocurvature with partially imaged thoracic levocurvature more inferiorly. Trace C3-C4 grade 1 retrolisthesis. Trace C4-C5 grade 1 anterolisthesis. Trace C5-C6 grade 1 retrolisthesis. Trace C7-T1 grade 1 anterolisthesis. Skull base and vertebrae: The basion-dental and atlanto-dental intervals are maintained.No evidence of acute fracture to the cervical spine. Soft tissues and spinal canal: No prevertebral fluid or swelling. No visible canal hematoma. Disc levels: Cervical spondylosis with multilevel disc space narrowing, disc bulges, uncovertebral hypertrophy and facet arthrosis. Additionally, a posterior disc osteophyte complex is present at C5-C6. Disc space narrowing is greatest at C4-C5 (moderate/advanced), C5-C6 (advanced) and C6-C7 (moderate). Multilevel spinal canal stenosis. Most notably, there is suspected moderate spinal canal stenosis at  C5-C6. Multilevel bony neural foraminal narrowing, greatest on the left at C4-C5 and bilaterally at C5-C6. Calcified central disc protrusion at T1-T2 resulting in mild relative spinal canal narrowing. Upper chest: No consolidation within the imaged lung apices. No visible pneumothorax. Smooth interlobular septal thickening within the imaged lung apices, nonspecific but possibly reflecting interstitial edema. IMPRESSION: CT head: 1. No evidence of acute intracranial abnormality. 2. Minimal chronic small vessel ischemic changes within the cerebral white matter. 3. Mild generalized cerebral atrophy. 4. Left mastoid effusion. CT cervical spine: 1. No evidence of acute fracture to the cervical spine. 2. Straightening of the expected cervical lordosis. 3. Cervicothoracic dextrocurvature with partially imaged thoracic levocurvature more inferiorly. 4. Mild grade 1 spondylolisthesis at C3-C4, C4-C5, C5-C6 and C7-T1, as detailed. 5. Cervical spondylosis, as outlined. Multilevel spinal canal stenosis. Most notably, there is apparent moderate spinal canal stenosis at C5-C6. Multilevel bony neural foraminal narrowing. 6. Smooth interlobular septal thickening within the imaged lung apices, nonspecific but possibly reflecting interstitial edema. Electronically Signed   By: Kellie Simmering D.O.   On: 07/21/2021 15:41    Labs on Admission: I have personally reviewed following labs  CBC: Recent Labs  Lab 07/21/21 1409  WBC 14.3*  HGB 13.2  HCT 38.6  MCV 97.2  PLT 161   Basic Metabolic Panel: Recent Labs  Lab 07/21/21 1409  NA 134*  K 4.0  CL 98  CO2 26  GLUCOSE 126*  BUN 15  CREATININE 0.77  CALCIUM 9.0   GFR: Estimated Creatinine Clearance: 44.9 mL/min (by  C-G formula based on SCr of 0.77 mg/dL).  Urine analysis:    Component Value Date/Time   COLORURINE AMBER (A) 07/21/2021 1409   APPEARANCEUR HAZY (A) 07/21/2021 1409   LABSPEC 1.027 07/21/2021 1409   PHURINE 5.0 07/21/2021 1409   GLUCOSEU  NEGATIVE 07/21/2021 1409   HGBUR MODERATE (A) 07/21/2021 1409   BILIRUBINUR NEGATIVE 07/21/2021 1409   KETONESUR 20 (A) 07/21/2021 1409   PROTEINUR 30 (A) 07/21/2021 1409   NITRITE NEGATIVE 07/21/2021 1409   LEUKOCYTESUR LARGE (A) 07/21/2021 1409   Dr. Tobie Poet [facility]  If 7PM-7AM, please contact overnight-coverage provider If 7AM-7PM, please contact day coverage provider [email or web address]  07/21/2021, 6:04 PM

## 2021-07-21 NOTE — ED Triage Notes (Signed)
Pt here with weakness and left wrist pain. Pt's family states that pt has been falling a lot for the last few months. Pt had a fall this morning and injured her left wrist. Pt in NAD in triage.

## 2021-07-21 NOTE — ED Notes (Signed)
Blood cultures x2, lactic acid, covid swab sent to lab.

## 2021-07-21 NOTE — Consult Note (Signed)
CODE SEPSIS - PHARMACY COMMUNICATION  **Broad Spectrum Antibiotics should be administered within 1 hour of Sepsis diagnosis**  Time Code Sepsis Called/Page Received: 1738  Antibiotics Ordered: azithromycin and ceftriaxone   Time of 1st antibiotic administration: [place], PharmD Pharmacy Resident  07/21/2021 5:38 PM

## 2021-07-21 NOTE — ED Provider Notes (Signed)
HPI: Pt is a 80 y.o. female who presents with complaints of fall.  The patient p/w  fall not balanced on feet onset 2 days ago. Family worried she had a stroke  ROS: Denies fever, chest pain, vomiting  Past Medical History:  Diagnosis Date   Asthma    COPD (chronic obstructive pulmonary disease) ([facility])    GERD (gastroesophageal reflux disease)    History of SCC (squamous cell carcinoma) of skin 06/23/2020   left nasal sidewall extending to eyelid / mohs surgery complete 08/24/20    Hyperlipidemia    Vitals:   07/21/21 1406  BP: 130/68  Pulse: 87  Resp: 16  Temp: 99 F (37.2 C)  SpO2: 98%    Focused Physical Exam: Gen: No acute distress Head: atraumatic, normocephalic Eyes: Extraocular movements grossly intact; conjunctiva clear CV: RRR Lung: No increased WOB, no stridor GI: ND, no obvious masses Neuro: Alert and awake  Tender in the left wrist.  Medical Decision Making and Plan: Given the patient's initial medical screening exam, the following diagnostic evaluation has been ordered. The patient will be placed in the appropriate treatment space, once one is available, to complete the evaluation and treatment. I have discussed the plan of care with the patient and I have advised the patient that an ED physician or mid-level practitioner will reevaluate their condition after the test results have been received, as the results may give them additional insight into the type of treatment they may need.   Diagnostics: NOT IN STROKE CODE RANGE GIVEN SYMPTOMS STARTED 2 days ago   Treatments: none immediately   Vanessa [place], MD 07/21/21 1420

## 2021-07-21 NOTE — Sepsis Progress Note (Signed)
[facility] following sepsis protocol 

## 2021-07-22 ENCOUNTER — Observation Stay: Payer: Medicare Other

## 2021-07-22 DIAGNOSIS — G9341 Metabolic encephalopathy: Secondary | ICD-10-CM | POA: Diagnosis present

## 2021-07-22 DIAGNOSIS — Z85828 Personal history of other malignant neoplasm of skin: Secondary | ICD-10-CM | POA: Diagnosis not present

## 2021-07-22 DIAGNOSIS — G43909 Migraine, unspecified, not intractable, without status migrainosus: Secondary | ICD-10-CM | POA: Diagnosis present

## 2021-07-22 DIAGNOSIS — R8281 Pyuria: Secondary | ICD-10-CM | POA: Diagnosis present

## 2021-07-22 DIAGNOSIS — R531 Weakness: Secondary | ICD-10-CM | POA: Diagnosis present

## 2021-07-22 DIAGNOSIS — Y92009 Unspecified place in unspecified non-institutional (private) residence as the place of occurrence of the external cause: Secondary | ICD-10-CM | POA: Diagnosis not present

## 2021-07-22 DIAGNOSIS — F419 Anxiety disorder, unspecified: Secondary | ICD-10-CM | POA: Diagnosis present

## 2021-07-22 DIAGNOSIS — M25532 Pain in left wrist: Secondary | ICD-10-CM | POA: Diagnosis not present

## 2021-07-22 DIAGNOSIS — J441 Chronic obstructive pulmonary disease with (acute) exacerbation: Secondary | ICD-10-CM | POA: Diagnosis present

## 2021-07-22 DIAGNOSIS — Z880 Allergy status to penicillin: Secondary | ICD-10-CM | POA: Diagnosis not present

## 2021-07-22 DIAGNOSIS — K219 Gastro-esophageal reflux disease without esophagitis: Secondary | ICD-10-CM | POA: Diagnosis present

## 2021-07-22 DIAGNOSIS — Z90710 Acquired absence of both cervix and uterus: Secondary | ICD-10-CM | POA: Diagnosis not present

## 2021-07-22 DIAGNOSIS — Z9181 History of falling: Secondary | ICD-10-CM | POA: Diagnosis not present

## 2021-07-22 DIAGNOSIS — Z7951 Long term (current) use of inhaled steroids: Secondary | ICD-10-CM | POA: Diagnosis not present

## 2021-07-22 DIAGNOSIS — F32A Depression, unspecified: Secondary | ICD-10-CM | POA: Diagnosis present

## 2021-07-22 DIAGNOSIS — Z9049 Acquired absence of other specified parts of digestive tract: Secondary | ICD-10-CM | POA: Diagnosis not present

## 2021-07-22 DIAGNOSIS — Z79899 Other long term (current) drug therapy: Secondary | ICD-10-CM | POA: Diagnosis not present

## 2021-07-22 DIAGNOSIS — J440 Chronic obstructive pulmonary disease with acute lower respiratory infection: Secondary | ICD-10-CM | POA: Diagnosis present

## 2021-07-22 DIAGNOSIS — W06XXXA Fall from bed, initial encounter: Secondary | ICD-10-CM | POA: Diagnosis not present

## 2021-07-22 DIAGNOSIS — W1830XA Fall on same level, unspecified, initial encounter: Secondary | ICD-10-CM | POA: Diagnosis present

## 2021-07-22 DIAGNOSIS — Z20822 Contact with and (suspected) exposure to covid-19: Secondary | ICD-10-CM | POA: Diagnosis present

## 2021-07-22 DIAGNOSIS — F39 Unspecified mood [affective] disorder: Secondary | ICD-10-CM | POA: Diagnosis not present

## 2021-07-22 DIAGNOSIS — Z7952 Long term (current) use of systemic steroids: Secondary | ICD-10-CM | POA: Diagnosis not present

## 2021-07-22 DIAGNOSIS — E785 Hyperlipidemia, unspecified: Secondary | ICD-10-CM | POA: Diagnosis present

## 2021-07-22 DIAGNOSIS — Y92230 Patient room in hospital as the place of occurrence of the external cause: Secondary | ICD-10-CM | POA: Diagnosis not present

## 2021-07-22 DIAGNOSIS — J189 Pneumonia, unspecified organism: Secondary | ICD-10-CM | POA: Diagnosis present

## 2021-07-22 LAB — BASIC METABOLIC PANEL
Anion gap: 6 (ref 5–15)
BUN: 14 mg/dL (ref 8–23)
CO2: 25 mmol/L (ref 22–32)
Calcium: 8.6 mg/dL — ABNORMAL LOW (ref 8.9–10.3)
Chloride: 107 mmol/L (ref 98–111)
Creatinine, Ser: 0.66 mg/dL (ref 0.44–1.00)
GFR, Estimated: >60 mL/min (ref >60)
Glucose, Bld: 105 mg/dL — ABNORMAL HIGH (ref 70–99)
Potassium: 3.6 mmol/L (ref 3.5–5.1)
Sodium: 138 mmol/L (ref 135–145)

## 2021-07-22 LAB — CBC
HCT: 38.1 % (ref 36.0–46.0)
Hemoglobin: 12.7 g/dL (ref 12.0–15.0)
MCH: 31.8 pg (ref 26.0–34.0)
MCHC: 33.3 g/dL (ref 30.0–36.0)
MCV: 95.3 fL (ref 80.0–100.0)
Platelets: 228 10*3/uL (ref 150–400)
RBC: 4 MIL/uL (ref 3.87–5.11)
RDW: 13.2 % (ref 11.5–15.5)
WBC: 12.4 10*3/uL — ABNORMAL HIGH (ref 4.0–10.5)
nRBC: 0 % (ref 0.0–0.2)

## 2021-07-22 LAB — VITAMIN B12: Vitamin B-12: 1560 pg/mL — ABNORMAL HIGH (ref 180–914)

## 2021-07-22 LAB — VITAMIN D 25 HYDROXY (VIT D DEFICIENCY, FRACTURES): Vit D, 25-Hydroxy: 102.64 ng/mL — ABNORMAL HIGH (ref 30–100)

## 2021-07-22 MED ORDER — BENZONATATE 100 MG PO CAPS: 100.0000 mg | ORAL_CAPSULE | Freq: Two times a day (BID) | ORAL | Status: DC | PRN

## 2021-07-22 MED ORDER — ENSURE ENLIVE PO LIQD
237.0000 mL | Freq: Three times a day (TID) | ORAL | Status: DC
  Administered 2021-07-22 (×2): 237 mL via ORAL

## 2021-07-22 MED ORDER — ADULT MULTIVITAMIN W/MINERALS CH
1.0000 | ORAL_TABLET | Freq: Every day | ORAL | Status: DC
  Administered 2021-07-23: 1 via ORAL
  Filled 2021-07-22: qty 1

## 2021-07-22 MED ORDER — DEXTROMETHORPHAN POLISTIREX ER 30 MG/5ML PO SUER
30.0000 mg | Freq: Two times a day (BID) | ORAL | Status: DC | PRN
  Filled 2021-07-22: qty 5

## 2021-07-22 NOTE — TOC Initial Note (Signed)
Transition of Care [facility]: [facility] W) - Initial/Assessment Note    Patient Details  Name: Abigail Powers MRN: [id number] Date of Birth: 1941/09/26  Transition of Care [facility]) CM/SW Contact:    Shelbie Hutching, RN Phone Number: 07/22/2021, 1:10 PM  Clinical Narrative:                 Patient admitted to the hospital with pneumonia.  RNCM met with patient and patient's family at the bedside.  Patient's husband and daughter at the bedside.  Patient is from home with her husband and usually very independent and active.  Patient drives, is current with her PCP and gets most prescriptions from [facility]   Patient is weak due to the pneumonia and PT is recommending home health.  Patient and husband agree to home health.  Corene Cornea with [facility] given [facility] referral for PT.    Expected Discharge Plan: [place] Barriers to Discharge: Continued Medical Work up   Patient Goals and CMS Choice Patient states their goals for this hospitalization and ongoing recovery are:: Patient wants to go home- she agrees to home health CMS Medicare.gov Compare Post Acute Care list provided to:: Patient Choice offered to / list presented to : Patient  Expected Discharge Plan and Services Expected Discharge Plan: [place]   Discharge Planning Services: CM Consult Post Acute Care Choice: [place] arrangements for the past 2 months: Single Family Home                 DME Arranged: N/A DME Agency: NA       HH Arranged: PT [place] Agency: [facility] Date HH Agency Contacted: 07/22/21 Time HH Agency Contacted: 75 Representative spoke with at [place]: Corene Cornea  Prior Living Arrangements/Services Living arrangements for the past 2 months: [place] Lives with:: Spouse Patient language and need for interpreter reviewed:: Yes Do you feel safe going back to the place where you live?: Yes      Need for Family Participation in Patient Care: Yes  (Comment) (pneumonia) Care giver support system in place?: Yes (comment) (husband and daughter) Current home services: DME (cane) Criminal Activity/Legal Involvement Pertinent to Current Situation/Hospitalization: No - Comment as needed  Activities of Daily Living Home Assistive Devices/Equipment: None ADL Screening (condition at time of admission) Patient's cognitive ability adequate to safely complete daily activities?: Yes Is the patient deaf or have difficulty hearing?: Yes Does the patient have difficulty seeing, even when wearing glasses/contacts?: No Does the patient have difficulty concentrating, remembering, or making decisions?: No Patient able to express need for assistance with ADLs?: Yes Does the patient have difficulty dressing or bathing?: No Independently performs ADLs?: Yes (appropriate for developmental age) Does the patient have difficulty walking or climbing stairs?: Yes Weakness of Legs: Both Weakness of Arms/Hands: None  Permission Sought/Granted Permission sought to share information with : Case Manager, Family Supports, Other (comment) Permission granted to share information with : Yes, Verbal Permission Granted  Share Information with NAME: Vallery Mcdade  Permission granted to share info w AGENCY: home heath agencies  Permission granted to share info w Relationship: husband  Permission granted to share info w Contact Information: [phone]  Emotional Assessment Appearance:: Appears younger than stated age Attitude/Demeanor/Rapport: Engaged Affect (typically observed): Accepting Orientation: : Oriented to Self, Oriented to Place, Oriented to  Time, Oriented to Situation Alcohol / Substance Use: Not Applicable Psych Involvement: No (comment)  Admission diagnosis:  Generalized weakness [R53.1] PNA (  pneumonia) [J18.9] Community acquired pneumonia of right middle lobe of lung [J18.9] Patient Active Problem List   Diagnosis Date Noted   Generalized  weakness 07/22/2021   Community acquired pneumonia of right middle lobe of lung 07/21/2021   Lumbar disc disease 03/05/2018   Other cirrhosis of liver ([place]) 12/12/2016   Abdominal pain, epigastric    Abnormal CT of the abdomen 08/23/2016   Constipation 08/23/2016   Abnormal weight loss 08/23/2016   Digital mucous cyst of finger of left hand 12/31/2015   COPD, 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   Allergic rhinitis 08/30/2015   Anxiety 08/30/2015   Arthritis 08/30/2015   Depression 08/30/2015   Acid reflux 08/30/2015   Hyperglycemia, unspecified 08/30/2015   Hyperlipidemia, unspecified 08/30/2015   Cannot sleep 08/30/2015   Headache, migraine 08/30/2015   OP (osteoporosis) 08/30/2015   Abdominal cramping 08/30/2015   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

## 2021-07-22 NOTE — Progress Notes (Signed)
   07/22/21 0052  What Happened  Was fall witnessed? No  Was patient injured? No  Patient found on floor  Found by Staff-comment  Stated prior activity to/from bed, chair, or stretcher  Follow Up  MD notified B. Randol Kern NP  Time MD notified [phone]  Family notified Yes - comment (attempted to call husband, he did not answer)  Time family notified 0100  Additional tests Yes-comment (CT head)  Simple treatment Other (comment) (n/a)  Progress note created (see row info) Yes  Adult Fall Risk Assessment  Risk Factor Category (scoring not indicated) High fall risk per protocol (document High fall risk)  Patient Fall Risk Level High fall risk  Adult Fall Risk Interventions  Required Bundle Interventions *See Row Information* High fall risk - low, moderate, and high requirements implemented  Additional Interventions Use of appropriate toileting equipment (bedpan, BSC, etc.)  Screening for Fall Injury Risk (To be completed on HIGH fall risk patients) - Assessing Need for Floor Mats  Risk For Fall Injury- Criteria for Floor Mats Admitted as a result of a fall;Previous fall this admission  Will Implement Floor Mats Yes  Vitals  Temp 100 F (37.8 C)  Temp Source Oral  BP (!) 142/54  MAP (mmHg) 78  BP Location Left Arm  BP Method Automatic  Patient Position (if appropriate) Lying  Pulse Rate (!) 104  Pulse Rate Source Monitor  Resp 20  Oxygen Therapy  SpO2 94 %  O2 Device Room Air  Pain Assessment  Pain Scale 0-10  Pain Score 0  PCA/Epidural/Spinal Assessment  Respiratory Pattern Regular;Unlabored  Neurological  Neuro (WDL) WDL  Level of Consciousness Alert  Glasgow Coma Scale  Eye Opening 4  Best Verbal Response (NON-intubated) 5  Best Motor Response 6  Glasgow Coma Scale Score 15  Musculoskeletal  Musculoskeletal (WDL) X  Assistive Device BSC  Generalized Weakness Yes  Weight Bearing Restrictions No  Integumentary  Integumentary (WDL) WDL  This nurse was called d/t a  high HR in the 140s. This nurse exited another patients room to check on patient and found the bed alarm going off. Patient was found sitting on the ground next to the bed. Patient stated she was trying to go to the bathroom and through she could make it on her own. Patient was not found to have any injuries. Transferred to the [facility] and then back to bed. VSS. NP notified. Attempted to call husband with no answer. CT head completed.

## 2021-07-22 NOTE — Assessment & Plan Note (Signed)
At baseline, patient has no cognitive impairment or dementia.  Here she was confused, disoriented to place.  I suspect this is mild delirium, given pneumonia, age. Delirium precautions:   -Lights and TV off, minimize interruptions at night  -Blinds open and lights on during day  -Glasses/hearing aid with patient  -Frequent reorientation  -PT/OT when able  -Avoid sedation medications/Beers list medications

## 2021-07-22 NOTE — Assessment & Plan Note (Signed)
Continue Lexapro

## 2021-07-22 NOTE — Assessment & Plan Note (Addendum)
Due to pneumonia, B12, vit D, TSH normal. Due to pneumonia. - Follow up B1   Note: did fall again last night, fall precautions in place, CT head unremarkable post-fall

## 2021-07-22 NOTE — Care Management Obs Status (Signed)
[place] NOTIFICATION   Patient Details  Name: Abigail Powers MRN: [id number] Date of Birth: April 02, 1941   Medicare Observation Status Notification Given:  Yes    Shelbie Hutching, RN 07/22/2021, 11:20 AM

## 2021-07-22 NOTE — Hospital Course (Signed)
Abigail Powers is a 80 y.o. F with mild COPD, migraines who presented with weakness and cough for a week.  Patient first developed weakness and fell a week or so ago, noted to her husband at that time that she had cough, "thought she had pneumonia".  Finally, got weaker, fell again, green sputum, had right sided chest discomfort worse with coughing, so came to the ER.  In the ER, the patient had CXR with RML pneumonia.  Was started on antibiotics and admitted.

## 2021-07-22 NOTE — Evaluation (Signed)
Physical Therapy Evaluation Patient Details Name: Abigail Powers MRN: [id number] DOB: 1941/01/01 Today's Date: 07/22/2021  History of Present Illness  Abigail Powers is a 80 y.o. female with medical history significant for COPD, GERD, history of squamous cell carcinoma of the skin, hyperlipidemia, depression, anxiety, migraine headaches, who presents emergency department for chief concerns of weakness and frequent fall.   Clinical Impression  Pt admitted with above diagnosis. Pt received sitting upright in bed agreeable to PT. Able to report PLOF, DME, home lay out. Does require increased cuing and further clarification on bathroom lay out as pt appears initially confused on answering bathroom questions appropraitely but able to after cues and clarification provided. Pt reports being indep at baseline with ADL's/IADL's and that she has not had frequent falls at her baseline, but just since she started feeling sick leading up to current admission. Pt resting on RA. Able to transfer to side of bed with supervision and stand with supervision. Pt amb 160' with minguard and slight unsteadiness with intermittent need for SUE support on railings/ counters to steady herself but no LOB or serious safety concerns without AD. Pt improve balance with cuing to improve walking speed leading to step through pattern and no sway compared to shuffling gait noted before cuing. On RA pt remains at 95% SPO2 with HR up to 120 BPM. Pt returned to room and given seated rest. RN student asked to accompany PT and pt to stairwell to assess pt performing stairs walking an additional 120'. Pt able to perform step to pattern with SUE support on L railing with asc and R railing with desc with CGA. Initially with asc pt attempts step through pattern which then requires BUE support on L railing due to poor LE strength. Education and cuing provided on step to pattern with improved stability and only need for SUE support with  no LOB/unsteadiness. HR did elevate to 127-129 BPM with stairs but no SOB noted with RR up to 31 BPM. Pt returned to room in recliner with RN present. Pt educated on benefits of [place] PT for minor balance deficits noted to improve safety with navigating home environment. Pt reports she needs to think about if she is open to [facility] PT or not. Pt currently with functional limitations due to the deficits listed below (see PT Problem List). Pt will benefit from skilled PT to increase their independence and safety with mobility to allow discharge to the venue listed below.      Recommendations for follow up therapy are one component of a multi-disciplinary discharge planning process, led by the attending physician.  Recommendations may be updated based on patient status, additional functional criteria and insurance authorization.  Follow Up Recommendations Home health PT;Supervision for mobility/OOB    Equipment Recommendations  None recommended by PT    Recommendations for Other Services       Precautions / Restrictions Precautions Precautions: Fall Restrictions Weight Bearing Restrictions: No      Mobility  Bed Mobility Overal bed mobility: Needs Assistance Bed Mobility: Supine to Sit     Supine to sit: Supervision       Patient Response: Cooperative  Transfers Overall transfer level: Needs assistance Equipment used: None Transfers: Sit to/from Stand Sit to Stand: Supervision            Ambulation/Gait Ambulation/Gait assistance: Min guard Gait Distance (Feet): 280 Feet Assistive device: None Gait Pattern/deviations: Step-to pattern;Decreased step length - right;Decreased step length - left;Drifts right/left;Narrow base of support Gait velocity:  decreased from baseline per subjective report   General Gait Details: Intermittent need for SUE support on railing/counter with minimal sway during ambulation but no LOB. ABle to increase speed with step through pattern upon request  with decrease in sway and appears more steady comapred to shuffling step to pattern.  Stairs            Wheelchair Mobility    Modified Rankin (Stroke Patients Only)       Balance Overall balance assessment: Mild deficits observed, not formally tested;Needs assistance Sitting-balance support: Feet supported;No upper extremity supported Sitting balance-Leahy Scale: Normal       Standing balance-Leahy Scale: Fair Standing balance comment: ABle to maintain standing with no external support with UE's. Does occasionally use SUE on stable surface to steady herself               High Level Balance Comments: able to amb at increased velocity upon request             Pertinent Vitals/Pain Pain Assessment: Faces Faces Pain Scale: Hurts a little bit Pain Location: L wrist Pain Descriptors / Indicators: Aching Pain Intervention(s): Monitored during session    Home Living Family/patient expects to be discharged to:: Private residence Living Arrangements: Spouse/significant other Available Help at Discharge: Family;Available PRN/intermittently (husband works 2x/week) Type of Home: House Home Access: Stairs to enter Entrance Stairs-Rails: Horticulturist, commercial of Steps: 5 Home Layout: One level Home Equipment: Cane - single point      Prior Function Level of Independence: Independent         Comments: x2 falls that pt endorses is new since feeling ill, has not been occuring prior to current illness/admission     Hand Dominance   Dominant Hand: Left    Extremity/Trunk Assessment   Upper Extremity Assessment Upper Extremity Assessment: Overall WFL for tasks assessed;LUE deficits/detail LUE Deficits / Details: Erythema and bruising on L wrist    Lower Extremity Assessment Lower Extremity Assessment: Generalized weakness    Cervical / Trunk Assessment Cervical / Trunk Assessment: Normal  Communication   Communication: No difficulties  Cognition  Arousal/Alertness: Awake/alert Behavior During Therapy: WFL for tasks assessed/performed Overall Cognitive Status: No family/caregiver present to determine baseline cognitive functioning                                 General Comments: Appears slightly confused. INitially states year is 1990 something, then corrects to 2022. Otherwise answers questios appropriately. SOme difficulty answering home layout with regards to bathroom lay out needing further questioning from PT for full clarification.      General Comments General comments (skin integrity, edema, etc.): Slight swelling/bruising noted on L wrist.    Exercises Other Exercises Other Exercises: Role of PT in acute setting, D/c recs, asc/desc 5 stairs with single railing.   Assessment/Plan    PT Assessment Patient needs continued PT services  PT Problem List Decreased strength;Decreased balance;Decreased safety awareness;Decreased mobility       PT Treatment Interventions DME instruction;Therapeutic exercise;Gait training;Balance training;Stair training;Neuromuscular re-education;Functional mobility training;Therapeutic activities;Patient/family education    PT Goals (Current goals can be found in the Care Plan section)  Acute Rehab PT Goals Patient Stated Goal: none stated PT Goal Formulation: With patient Time For Goal Achievement: 08/05/21    Frequency Min 2X/week   Barriers to discharge        Co-evaluation  AM-PAC PT "6 Clicks" Mobility  Outcome Measure Help needed turning from your back to your side while in a flat bed without using bedrails?: None Help needed moving from lying on your back to sitting on the side of a flat bed without using bedrails?: None Help needed moving to and from a bed to a chair (including a wheelchair)?: A Little Help needed standing up from a chair using your arms (e.g., wheelchair or bedside chair)?: A Little Help needed to walk in hospital room?: A  Little Help needed climbing 3-5 steps with a railing? : A Little 6 Click Score: 20    End of Session Equipment Utilized During Treatment: Gait belt Activity Tolerance: Patient tolerated treatment well Patient left: in chair;with nursing/sitter in room Nurse Communication: Mobility status PT Visit Diagnosis: Unsteadiness on feet (R26.81);Repeated falls (R29.6);Muscle weakness (generalized) (M62.81)    Time: 0388-8280 PT Time Calculation (min) (ACUTE ONLY): 29 min   Charges:   PT Evaluation $PT Eval Low Complexity: 1 Low PT Treatments $Gait Training: 8-22 mins       Salem Caster. Fairly IV, PT, DPT Physical Therapist- [facility]  07/22/2021, 11:15 AM

## 2021-07-22 NOTE — Assessment & Plan Note (Addendum)
PSI 89, but remains confused this morning, more than her baseline.  (At baseline, she has no memory problems, here, this morning, she was disoriented, thought she was in [place], didn't remember coming to the room from the ER).  Also fell again, out of bed last night  - Continue ceftriaxone and azithromycin - Repeat CXR in 4 weeks to document resolution

## 2021-07-22 NOTE — Progress Notes (Signed)
Progress Note    Abigail Powers   UXN:[id number]  DOB: 13-Jul-1941  DOA: 07/21/2021     0 Date of Service: 07/22/2021      Brief summary: Abigail Powers is a 80 y.o. F with mild COPD, migraines who presented with weakness and cough for a week.  Patient first developed weakness and fell a week or so ago, noted to her husband at that time that she had cough, "thought she had pneumonia".  Finally, got weaker, fell again, green sputum, had right sided chest discomfort worse with coughing, so came to the ER.  In the ER, the patient had CXR with RML pneumonia.  Was started on antibiotics and admitted.       Clinical Course Fell overnight because she was trying to walk without supervision.  Today, remains more confused than baseline this morning.  Assessment and Plan * Community acquired pneumonia of right middle lobe of lung PSI 89, but remains confused this morning, more than her baseline.  (At baseline, she has no memory problems, here, this morning, she was disoriented, thought she was in [place], didn't remember coming to the room from the ER).  Also fell again, out of bed last night  - Continue ceftriaxone and azithromycin - Repeat CXR in 4 weeks to document resolution  Generalized weakness Due to pneumonia, B12, vit D, TSH normal. Due to pneumonia. - Follow up B1   Note: did fall again last night, fall precautions in place, CT head unremarkable post-fall  Acute metabolic encephalopathy At baseline, patient has no cognitive impairment or dementia.  Here she was confused, disoriented to place.  I suspect this is mild delirium, given pneumonia, age. Delirium precautions:   -Lights and TV off, minimize interruptions at night  -Blinds open and lights on during day  -Glasses/hearing aid with patient  -Frequent reorientation  -PT/OT when able  -Avoid sedation medications/Beers list medications    Hyperlipidemia, unspecified -Continue Lipitor  Acid reflux -Continue  PPI  Anxiety -Continue Lexapro     Subjective:  Still with pleuritic pain.  No fever, no dyspnea.  No hemoptysis.  Objective Vitals:   07/22/21 0645 07/22/21 0836 07/22/21 0945 07/22/21 1219  BP: (!) 129/54 100/85 (!) 128/52 137/61  Pulse: 96 95 92 74  Resp: 18 15  18   Temp: 98.4 F (36.9 C) 98.4 F (36.9 C)  98.7 F (37.1 C)  TempSrc:  Oral    SpO2: 95% 96% 100% 99%  Weight:      Height:       49 kg  Vital signs were reviewed and unremarkable.   Exam Physical Exam Constitutional:      General: She is not in acute distress.    Appearance: Normal appearance. She is not toxic-appearing.     Comments: thin  HENT:     Head: Normocephalic and atraumatic.     Nose: Nose normal.     Mouth/Throat:     Comments: hoarse Eyes:     General: No scleral icterus.       Right eye: No discharge.        Left eye: No discharge.     Extraocular Movements: Extraocular movements intact.  Cardiovascular:     Rate and Rhythm: Normal rate and regular rhythm.     Heart sounds: Normal heart sounds. No murmur heard.   No gallop.  Pulmonary:     Effort: Pulmonary effort is normal.     Breath sounds: Normal breath sounds. No wheezing or rales.  Abdominal:     General: Abdomen is flat.     Palpations: Abdomen is soft.     Tenderness: There is no abdominal tenderness. There is no guarding.  Musculoskeletal:        General: No swelling or deformity. Normal range of motion.  Skin:    General: Skin is warm and dry.  Neurological:     General: No focal deficit present.     Mental Status: She is alert. She is disoriented.     Motor: Weakness present.  Psychiatric:        Mood and Affect: Mood normal.        Behavior: Behavior normal.        Thought Content: Thought content normal.       Labs / Other Information My review of labs, imaging, notes and other tests shows no new significant findings.    Disposition Plan: Status is: Inpatient  Remains inpatient appropriate  because: she has PSI 89 but despite initiating treatment yesterday, she is confused this morning, more than baseline, still has systemic weakness to the point that she fell overnight, and I feel requires ongoing IV antibiotics.  If remains afebrile, with normal HR at rest, RR and SpO2 and able to take orals, likely will discharge tomorrow with oral antibiotics to complete 5 days.           Time spent: 35 minutes [facility] 07/22/2021, 2:51 PM

## 2021-07-22 NOTE — Assessment & Plan Note (Signed)
Continue PPI ?

## 2021-07-22 NOTE — Assessment & Plan Note (Signed)
-  Continue Lipitor °

## 2021-07-22 NOTE — Progress Notes (Signed)
Initial Nutrition Assessment  DOCUMENTATION CODES:  Underweight  INTERVENTION:  Liberalize diet to regular.  Add Ensure Plus High Protein po TID, each supplement provides 350 kcal and 20 grams of protein.   Add Magic cup TID with meals, each supplement provides 290 kcal and 9 grams of protein.  Add MVI with minerals daily.  Encourage PO and supplement intake.  NUTRITION DIAGNOSIS:  Increased nutrient needs related to acute illness (CAP) as evidenced by estimated needs.  GOAL:  Patient will meet greater than or equal to 90% of their needs  MONITOR:  PO intake, Supplement acceptance, Labs, Weight trends, I & O's  REASON FOR ASSESSMENT:  Other (Comment) (Low BMI)    ASSESSMENT:  80 yo female with a PMH of COPD, GERD, history of squamous cell carcinoma of the skin, hyperlipidemia, depression, anxiety, and migraine headaches who presents for weakness and frequent falls. Admitted with CAP of RML of lung.  Pt with MD and team at time of RD visit.  Per H&P, no noted weight change, GI distress, or appetite changes.  Pt's weight appears stable for the past 2.5 years per Epic and Care Everywhere.   Suspect pt is severely malnourished, but RD cannot definitively diagnose at this time.  Recommend adding Ensure TID, Magic Cup TID, and MVI with minerals.  Medications: reviewed; Protonix  Labs: reviewed; Glucose 105 (H), Vitamin D, 25-Hydroxy 102.64 (H), Vitamin B12 1560 (H)  NUTRITION - FOCUSED PHYSICAL EXAM: Unable to perform - defer to follow-up  Diet Order:   Diet Order             Diet regular Room service appropriate? Yes; Fluid consistency: Thin  Diet effective now                  EDUCATION NEEDS:  Education needs have been addressed  Skin:  Skin Assessment: Reviewed RN Assessment  Last BM:  07/21/21 - Type 5, medium  Height:  Ht Readings from Last 1 Encounters:  07/21/21 5' 4.5" (1.638 m)   Weight:  Wt Readings from Last 1 Encounters:  07/21/21 49 kg    BMI:  Body mass index is 18.26 kg/m.  Estimated Nutritional Needs:  Kcal:  1600-1800 Protein:  65-80 grams Fluid:  >1.6 L  Derrel Nip, RD, LDN (she/her/hers) Registered Dietitian I After-Hours/Weekend Pager # in [place]

## 2021-07-23 ENCOUNTER — Encounter: Payer: Self-pay | Admitting: Family Medicine

## 2021-07-23 DIAGNOSIS — F39 Unspecified mood [affective] disorder: Secondary | ICD-10-CM

## 2021-07-23 DIAGNOSIS — J441 Chronic obstructive pulmonary disease with (acute) exacerbation: Secondary | ICD-10-CM

## 2021-07-23 MED ORDER — PREDNISONE 20 MG PO TABS: 40.0000 mg | ORAL_TABLET | Freq: Every day | ORAL | 0 refills | Status: AC

## 2021-07-23 MED ORDER — CEFDINIR 300 MG PO CAPS: 300.0000 mg | ORAL_CAPSULE | Freq: Two times a day (BID) | ORAL | 0 refills | Status: AC

## 2021-07-23 MED ORDER — AZITHROMYCIN 250 MG PO TABS: 250.0000 mg | ORAL_TABLET | Freq: Every day | ORAL | 0 refills | Status: DC

## 2021-07-23 NOTE — Discharge Summary (Signed)
Physician Discharge Summary  Abigail Powers KDX:[id number] DOB: [birth year] DOA: 07/21/2021  PCP: Rusty Aus, MD  Admit date: 07/21/2021 Discharge date: 07/23/2021 Admitted From: Home. Disposition: Home Recommendations for Outpatient Follow-up:  Follow ups as below. Please obtain CBC/BMP/Mag at follow up Please follow up on the following pending results: None Home Health: HH PT Equipment/Devices: Not indicated Discharge Condition: Stable CODE STATUS: Full code  Follow-up Information     Rusty Aus, MD. Schedule an appointment as soon as possible for a visit in 1 week(s).   Specialty: Internal Medicine Contact information: [address] [place] [address] [phone]                Hospital Course: 80 year old F with history of COPD, mood disorder and migraines presenting with generalized weakness and cough for about a week, and admitted for community-acquired pneumonia.  Chest x-ray revealed right middle lung consolidation consistent with pneumonia.  She was a started on IV ceftriaxone and azithromycin with improvement in his symptoms.  She is discharged on p.o. cefdinir and azithromycin to complete treatment course.  Also given prescription for prednisone for possible COPD exacerbation.  She is already on Symbicort and as needed albuterol.  Weakness improved.  Hospital course complicated by acute metabolic encephalopathy that has resolved on the day of discharge.  Encephalopathy work-up unrevealing.  Of note, vitamin D level markedly elevated to 102.  She takes vitamin D and calcium with vitamin D at home.  These are discontinued on discharge.  She has been advised to avoid vitamin D supplementation.  Recommend rechecking vitamin D in 3 to 4 months.  See individual problem list below for more on hospital course.  Discharge Diagnoses:  Community-acquired pneumonia/RML pneumonia: Improved.  No oxygen  requirement. -Discharged on p.o. cefdinir for 4 more days and azithromycin for 2 more days to complete treatment course. -Mucolytic's and antitussive as needed  Mild COPD exacerbation in the setting of pneumonia -Discharged on p.o. prednisone -Antibiotics as above. -Continue home Symbicort and as needed albuterol  Acute metabolic encephalopathy: Resolved.  Oriented x4  Mood disorder: On multiple medication at home but does not seem to be taking.  Stable. -Defer to PCP  GERD/acid reflux -Continue PPI  Increased nutrient needs Body mass index is 18.26 kg/m. Nutrition Problem: Increased nutrient needs Etiology: acute illness (CAP) Signs/Symptoms: estimated needs Interventions: Ensure Enlive (each supplement provides 350kcal and 20 grams of protein), Magic cup, MVI, Liberalize Diet     Discharge Exam: Vitals:   07/22/21 2005 07/23/21 0021 07/23/21 0436 07/23/21 0730  BP: (!) 120/51 (!) 135/53 134/65 117/68  Pulse: 79 91 (!) 102 90  Temp: 98.8 F (37.1 C) 98.9 F (37.2 C) 98.4 F (36.9 C) 98.8 F (37.1 C)  Resp: 18 18 16 15   Height:      Weight:      SpO2: 96% 94% 92% 95%  TempSrc: Oral Oral Oral Oral  BMI (Calculated):         GENERAL: No apparent distress.  Nontoxic. HEENT: MMM.  Vision and hearing grossly intact.  NECK: Supple.  No apparent JVD.  RESP: 95% on RA.  No IWOB.  Fair aeration bilaterally. CVS:  RRR. Heart sounds normal.  ABD/GI/GU: Bowel sounds present. Soft. Non tender.  MSK/EXT:  Moves extremities. No apparent deformity. No edema.  SKIN: no apparent skin lesion or wound NEURO: Awake and alert.  Oriented appropriately.  No apparent focal neuro deficit. PSYCH: Calm. Normal affect.  Discharge Instructions  Discharge Instructions     Call MD for:  difficulty breathing, headache or visual disturbances   Complete by: As directed    Call MD for:  extreme fatigue   Complete by: As directed    Call MD for:  persistant dizziness or light-headedness    Complete by: As directed    Call MD for:  persistant nausea and vomiting   Complete by: As directed    Call MD for:  redness, tenderness, or signs of infection (pain, swelling, redness, odor or green/yellow discharge around incision site)   Complete by: As directed    Call MD for:  temperature >100.4   Complete by: As directed    Diet general   Complete by: As directed    Discharge instructions   Complete by: As directed    It has been a pleasure taking care of you!  You were hospitalized with shortness of breath and cough due to pneumonia.  We have started you on antibiotics, and discharging you more antibiotics to complete treatment course.  We have also added steroids to calm the inflammation and cough.  It is very important that you complete the whole course of antibiotics and a steroid.  Continue using you inhalers for your COPD.   Your vitamin D level is very high.  We recommend you stop taking vitamin D supplementations.  Please review your new medication list and the directions on your medications before you take them.  You may need to review some of your old medications with you primary care doctor.  Follow-up with your primary care doctor in 1 to 2 weeks or sooner if needed.   Take care,   Increase activity slowly   Complete by: As directed       Allergies as of 07/23/2021       Reactions   Penicillins Rash        Medication List     STOP taking these medications    Calcium Carb-Ergocalciferol 250-125 MG-UNIT Tabs   sucralfate 1 g tablet Commonly known as: CARAFATE   Vitamin D3 25 MCG (1000 UT) Caps       TAKE these medications    atorvastatin 20 MG tablet Commonly known as: LIPITOR Take by mouth.   azelastine 0.1 % nasal spray Commonly known as: ASTELIN   azithromycin 250 MG tablet Commonly known as: Zithromax Take 1 tablet (250 mg total) by mouth daily. Start taking on: July 24, 2021   budesonide-formoterol 160-4.5 MCG/ACT  inhaler Commonly known as: SYMBICORT Inhale 2 puffs into the lungs 2 (two) times daily.   buPROPion 75 MG tablet Commonly known as: WELLBUTRIN   buPROPion 100 MG tablet Commonly known as: WELLBUTRIN   busPIRone 5 MG tablet Commonly known as: BUSPAR   cefdinir 300 MG capsule Commonly known as: OMNICEF Take 1 capsule (300 mg total) by mouth 2 (two) times daily for 4 days.   cetirizine 10 MG tablet Commonly known as: ZYRTEC Take by mouth.   clobetasol ointment 0.05 % Commonly known as: TEMOVATE Apply 1 application topically 2 (two) times daily. For up to 2 weeks as needed for itch. Avoid applying to face, groin, and axilla. Use as directed. Risk of skin atrophy with long-term use reviewed.   clonazePAM 1 MG tablet Commonly known as: KLONOPIN Take 1 mg by mouth at bedtime as needed.   diclofenac 50 MG EC tablet Commonly known as: VOLTAREN   dicyclomine 10 MG capsule Commonly known as: BENTYL TAKE (1)  CAPSULE BY MOUTH FOUR TIMES A DAY BEFORE MEALS AND AT BEDTIME   escitalopram 5 MG tablet Commonly known as: LEXAPRO Take 5 mg by mouth daily.   etodolac 400 MG tablet Commonly known as: LODINE Take 400 mg by mouth 2 (two) times daily.   gabapentin 100 MG capsule Commonly known as: NEURONTIN   gentamicin cream 0.1 % Commonly known as: GARAMYCIN Apply 1 application topically 2 (two) times daily.   hydrocortisone 2.5 % cream   hydrocortisone 2.5 % rectal cream Commonly known as: ANUSOL-HC   magnesium oxide 400 MG tablet Commonly known as: MAG-OX Take by mouth.   meclizine 25 MG tablet Commonly known as: ANTIVERT   meloxicam 7.5 MG tablet Commonly known as: MOBIC Take 7.5 mg by mouth daily.   mupirocin ointment 2 % Commonly known as: Bactroban Apply to wound twice a day.   pantoprazole 40 MG tablet Commonly known as: PROTONIX   polyethylene glycol powder 17 GM/SCOOP powder Commonly known as: GLYCOLAX/MIRALAX Take 1 Container by mouth once.    predniSONE 20 MG tablet Commonly known as: DELTASONE Take 2 tablets (40 mg total) by mouth daily for 5 days. What changed:  medication strength how much to take how to take this when to take this Another medication with the same name was removed. Continue taking this medication, and follow the directions you see here.   Psyllium 48.57 % Powd Take by mouth.   rOPINIRole 0.5 MG tablet Commonly known as: REQUIP Take 0.5 mg by mouth at bedtime.   SUMAtriptan 50 MG tablet Commonly known as: IMITREX Take 50 mg by mouth every 2 (two) hours as needed for migraine. May repeat in 2 hours if headache persists or recurs.   traZODone 50 MG tablet Commonly known as: DESYREL Take 50 mg by mouth at bedtime.   Ventolin HFA 108 (90 Base) MCG/ACT inhaler Generic drug: albuterol   vitamin B-12 1000 MCG tablet Commonly known as: CYANOCOBALAMIN Take by mouth.        Consultations: None  Procedures/Studies: None   DG Chest 2 View  Result Date: 07/21/2021 CLINICAL DATA:  sob EXAM: CHEST - 2 VIEW COMPARISON:  Feb 24, 2021. FINDINGS: The heart size and mediastinal contours are within normal limits. Ill-defined opacity in the lateral right midlung. There could be a small amount fluid tracking along the fissure in this region. Streaky left basilar opacities. No visible pneumothorax. Reverse S-shaped thoracolumbar curvature with degenerative change. Cholecystectomy clips. Remote right rib fractures. IMPRESSION: 1. Ill-defined opacity in the lateral right midlung, possibly pneumonia. There could be a small amount fluid tracking along the fissure in this region. Followup PA and lateral chest X-ray is recommended in 3-4 weeks following trial of antibiotic therapy to ensure resolution and exclude underlying malignancy. 2. Streaky left basilar opacities, atelectasis versus pneumonia. Electronically Signed   By: Margaretha Sheffield M.D.   On: 07/21/2021 14:58   DG Wrist Complete Left  Result Date:  07/21/2021 CLINICAL DATA:  Fall, left wrist pain EXAM: LEFT WRIST - COMPLETE 3+ VIEW COMPARISON:  None. FINDINGS: No evidence of acute fracture. No malalignment. Severe degenerative changes of the first carpometacarpal joint with slight radial subluxation. Bones are mildly demineralized. No focal soft tissue swelling. IMPRESSION: No acute fracture or dislocation of the left wrist. Electronically Signed   By: Davina Poke D.O.   On: 07/21/2021 14:56   CT HEAD WO CONTRAST (5MM)  Result Date: 07/22/2021 CLINICAL DATA:  Unwitnessed fall EXAM: CT HEAD WITHOUT CONTRAST TECHNIQUE: Contiguous axial  images were obtained from the base of the skull through the vertex without intravenous contrast. COMPARISON:  07/21/2021 FINDINGS: Brain: No evidence of acute infarction, hemorrhage, hydrocephalus, extra-axial collection or mass lesion/mass effect. Mild subcortical white matter and periventricular small vessel ischemic changes. Vascular: No hyperdense vessel or unexpected calcification. Skull: Normal. Negative for fracture or focal lesion. Sinuses/Orbits: The visualized paranasal sinuses are essentially clear. Partial opacification of the left mastoid air cells. Other: None. IMPRESSION: No evidence of acute intracranial abnormality. Mild small vessel ischemic changes. Electronically Signed   By: Julian Hy M.D.   On: 07/22/2021 03:57   CT HEAD WO CONTRAST  Result Date: 07/21/2021 CLINICAL DATA:  Head trauma, minor. Neck trauma. Additional history provided: Weakness in left wrist pain with multiple recent falls, including a fall this morning. EXAM: CT HEAD WITHOUT CONTRAST CT CERVICAL SPINE WITHOUT CONTRAST TECHNIQUE: Multidetector CT imaging of the head and cervical spine was performed following the standard protocol without intravenous contrast. Multiplanar CT image reconstructions of the cervical spine were also generated. COMPARISON:  Brain MRI 01/01/2017. Head CT 10/28/2010. Report from radiographs of  the cervical spine 05/01/2019 (images unavailable). FINDINGS: CT HEAD FINDINGS Brain: Mild generalized cerebral atrophy. Minimal patchy and ill-defined hypoattenuation within the cerebral white matter, nonspecific but compatible with chronic small vessel ischemic disease. There is no acute intracranial hemorrhage. No demarcated cortical infarct. No extra-axial fluid collection. No evidence of an intracranial mass. No midline shift. Vascular: No hyperdense vessel.  Atherosclerotic calcifications. Skull: Normal. Negative for fracture or focal lesion. Sinuses/Orbits: Visualized orbits show no acute finding. Small volume frothy secretions within the right sphenoid sinus. Other: Left mastoid effusion. CT CERVICAL SPINE FINDINGS Alignment: Straightening of the expected cervical lordosis. Cervicothoracic dextrocurvature with partially imaged thoracic levocurvature more inferiorly. Trace C3-C4 grade 1 retrolisthesis. Trace C4-C5 grade 1 anterolisthesis. Trace C5-C6 grade 1 retrolisthesis. Trace C7-T1 grade 1 anterolisthesis. Skull base and vertebrae: The basion-dental and atlanto-dental intervals are maintained.No evidence of acute fracture to the cervical spine. Soft tissues and spinal canal: No prevertebral fluid or swelling. No visible canal hematoma. Disc levels: Cervical spondylosis with multilevel disc space narrowing, disc bulges, uncovertebral hypertrophy and facet arthrosis. Additionally, a posterior disc osteophyte complex is present at C5-C6. Disc space narrowing is greatest at C4-C5 (moderate/advanced), C5-C6 (advanced) and C6-C7 (moderate). Multilevel spinal canal stenosis. Most notably, there is suspected moderate spinal canal stenosis at C5-C6. Multilevel bony neural foraminal narrowing, greatest on the left at C4-C5 and bilaterally at C5-C6. Calcified central disc protrusion at T1-T2 resulting in mild relative spinal canal narrowing. Upper chest: No consolidation within the imaged lung apices. No visible  pneumothorax. Smooth interlobular septal thickening within the imaged lung apices, nonspecific but possibly reflecting interstitial edema. IMPRESSION: CT head: 1. No evidence of acute intracranial abnormality. 2. Minimal chronic small vessel ischemic changes within the cerebral white matter. 3. Mild generalized cerebral atrophy. 4. Left mastoid effusion. CT cervical spine: 1. No evidence of acute fracture to the cervical spine. 2. Straightening of the expected cervical lordosis. 3. Cervicothoracic dextrocurvature with partially imaged thoracic levocurvature more inferiorly. 4. Mild grade 1 spondylolisthesis at C3-C4, C4-C5, C5-C6 and C7-T1, as detailed. 5. Cervical spondylosis, as outlined. Multilevel spinal canal stenosis. Most notably, there is apparent moderate spinal canal stenosis at C5-C6. Multilevel bony neural foraminal narrowing. 6. Smooth interlobular septal thickening within the imaged lung apices, nonspecific but possibly reflecting interstitial edema. Electronically Signed   By: Kellie Simmering D.O.   On: 07/21/2021 15:41   CT Cervical Spine Wo  Contrast  Result Date: 07/21/2021 CLINICAL DATA:  Head trauma, minor. Neck trauma. Additional history provided: Weakness in left wrist pain with multiple recent falls, including a fall this morning. EXAM: CT HEAD WITHOUT CONTRAST CT CERVICAL SPINE WITHOUT CONTRAST TECHNIQUE: Multidetector CT imaging of the head and cervical spine was performed following the standard protocol without intravenous contrast. Multiplanar CT image reconstructions of the cervical spine were also generated. COMPARISON:  Brain MRI 01/01/2017. Head CT 10/28/2010. Report from radiographs of the cervical spine 05/01/2019 (images unavailable). FINDINGS: CT HEAD FINDINGS Brain: Mild generalized cerebral atrophy. Minimal patchy and ill-defined hypoattenuation within the cerebral white matter, nonspecific but compatible with chronic small vessel ischemic disease. There is no acute intracranial  hemorrhage. No demarcated cortical infarct. No extra-axial fluid collection. No evidence of an intracranial mass. No midline shift. Vascular: No hyperdense vessel.  Atherosclerotic calcifications. Skull: Normal. Negative for fracture or focal lesion. Sinuses/Orbits: Visualized orbits show no acute finding. Small volume frothy secretions within the right sphenoid sinus. Other: Left mastoid effusion. CT CERVICAL SPINE FINDINGS Alignment: Straightening of the expected cervical lordosis. Cervicothoracic dextrocurvature with partially imaged thoracic levocurvature more inferiorly. Trace C3-C4 grade 1 retrolisthesis. Trace C4-C5 grade 1 anterolisthesis. Trace C5-C6 grade 1 retrolisthesis. Trace C7-T1 grade 1 anterolisthesis. Skull base and vertebrae: The basion-dental and atlanto-dental intervals are maintained.No evidence of acute fracture to the cervical spine. Soft tissues and spinal canal: No prevertebral fluid or swelling. No visible canal hematoma. Disc levels: Cervical spondylosis with multilevel disc space narrowing, disc bulges, uncovertebral hypertrophy and facet arthrosis. Additionally, a posterior disc osteophyte complex is present at C5-C6. Disc space narrowing is greatest at C4-C5 (moderate/advanced), C5-C6 (advanced) and C6-C7 (moderate). Multilevel spinal canal stenosis. Most notably, there is suspected moderate spinal canal stenosis at C5-C6. Multilevel bony neural foraminal narrowing, greatest on the left at C4-C5 and bilaterally at C5-C6. Calcified central disc protrusion at T1-T2 resulting in mild relative spinal canal narrowing. Upper chest: No consolidation within the imaged lung apices. No visible pneumothorax. Smooth interlobular septal thickening within the imaged lung apices, nonspecific but possibly reflecting interstitial edema. IMPRESSION: CT head: 1. No evidence of acute intracranial abnormality. 2. Minimal chronic small vessel ischemic changes within the cerebral white matter. 3. Mild  generalized cerebral atrophy. 4. Left mastoid effusion. CT cervical spine: 1. No evidence of acute fracture to the cervical spine. 2. Straightening of the expected cervical lordosis. 3. Cervicothoracic dextrocurvature with partially imaged thoracic levocurvature more inferiorly. 4. Mild grade 1 spondylolisthesis at C3-C4, C4-C5, C5-C6 and C7-T1, as detailed. 5. Cervical spondylosis, as outlined. Multilevel spinal canal stenosis. Most notably, there is apparent moderate spinal canal stenosis at C5-C6. Multilevel bony neural foraminal narrowing. 6. Smooth interlobular septal thickening within the imaged lung apices, nonspecific but possibly reflecting interstitial edema. Electronically Signed   By: Kellie Simmering D.O.   On: 07/21/2021 15:41       The results of significant diagnostics from this hospitalization (including imaging, microbiology, ancillary and laboratory) are listed below for reference.     Microbiology: Recent Results (from the past 240 hour(s))  Resp Panel by RT-PCR (Flu A&B, Covid) Nasopharyngeal Swab     Status: None   Collection Time: 07/21/21  5:36 PM   Specimen: Nasopharyngeal Swab; Nasopharyngeal(NP) swabs in vial transport medium  Result Value Ref Range Status   SARS Coronavirus 2 by RT PCR NEGATIVE NEGATIVE Final    Comment: (NOTE) SARS-CoV-2 target nucleic acids are NOT DETECTED.  The SARS-CoV-2 RNA is generally detectable in upper respiratory specimens during  the acute phase of infection. The lowest concentration of SARS-CoV-2 viral copies this assay can detect is 138 copies/mL. A negative result does not preclude SARS-Cov-2 infection and should not be used as the sole basis for treatment or other patient management decisions. A negative result may occur with  improper specimen collection/handling, submission of specimen other than nasopharyngeal swab, presence of viral mutation(s) within the areas targeted by this assay, and inadequate number of viral copies(<138  copies/mL). A negative result must be combined with clinical observations, patient history, and epidemiological information. The expected result is Negative.  Fact Sheet for Patients:  EntrepreneurPulse.com.au  Fact Sheet for Healthcare Providers:  IncredibleEmployment.be  This test is no t yet approved or cleared by the [place] FDA and  has been authorized for detection and/or diagnosis of SARS-CoV-2 by FDA under an Emergency Use Authorization (EUA). This EUA will remain  in effect (meaning this test can be used) for the duration of the COVID-19 declaration under Section 564(b)(1) of the Act, 21 U.S.C.section 360bbb-3(b)(1), unless the authorization is terminated  or revoked sooner.       Influenza A by PCR NEGATIVE NEGATIVE Final   Influenza B by PCR NEGATIVE NEGATIVE Final    Comment: (NOTE) The Xpert Xpress SARS-CoV-2/FLU/RSV plus assay is intended as an aid in the diagnosis of influenza from Nasopharyngeal swab specimens and should not be used as a sole basis for treatment. Nasal washings and aspirates are unacceptable for Xpert Xpress SARS-CoV-2/FLU/RSV testing.  Fact Sheet for Patients: EntrepreneurPulse.com.au  Fact Sheet for Healthcare Providers: IncredibleEmployment.be  This test is not yet approved or cleared by the [place] FDA and has been authorized for detection and/or diagnosis of SARS-CoV-2 by FDA under an Emergency Use Authorization (EUA). This EUA will remain in effect (meaning this test can be used) for the duration of the COVID-19 declaration under Section 564(b)(1) of the Act, 21 U.S.C. section 360bbb-3(b)(1), unless the authorization is terminated or revoked.  Performed at [facility], [address] [place], [place] [address]   Blood culture (routine x 2)     Status: None (Preliminary result)   Collection Time: 07/21/21  5:46 PM   Specimen: BLOOD   Result Value Ref Range Status   Specimen Description BLOOD RIGHT ANTECUBITAL  Final   Special Requests   Final    BOTTLES DRAWN AEROBIC AND ANAEROBIC Blood Culture adequate volume   Culture   Final    NO GROWTH < 24 HOURS Performed at [facility], [address] [place], [place] [address]    Report Status PENDING  Incomplete  Blood culture (routine x 2)     Status: None (Preliminary result)   Collection Time: 07/21/21  5:46 PM   Specimen: BLOOD  Result Value Ref Range Status   Specimen Description BLOOD RIGHT ANTECUBITAL  Final   Special Requests   Final    BOTTLES DRAWN AEROBIC AND ANAEROBIC Blood Culture adequate volume   Culture   Final    NO GROWTH < 24 HOURS Performed at [facility], [address] [place], [place] [address]    Report Status PENDING  Incomplete     Labs:  CBC: Recent Labs  Lab 07/21/21 1409 07/22/21 0434  WBC 14.3* 12.4*  HGB 13.2 12.7  HCT 38.6 38.1  MCV 97.2 95.3  PLT 241 228   BMP &GFR Recent Labs  Lab 07/21/21 1409 07/22/21 0434  NA 134* 138  K 4.0 3.6  CL 98 107  CO2 26  25  GLUCOSE 126* 105*  BUN 15 14  CREATININE 0.77 0.66  CALCIUM 9.0 8.6*   Estimated Creatinine Clearance: 44.1 mL/min (by C-G formula based on SCr of 0.66 mg/dL). Liver & Pancreas: No results for input(s): AST, ALT, ALKPHOS, BILITOT, PROT, ALBUMIN in the last 168 hours. No results for input(s): LIPASE, AMYLASE in the last 168 hours. No results for input(s): AMMONIA in the last 168 hours. Diabetic: No results for input(s): HGBA1C in the last 72 hours. No results for input(s): GLUCAP in the last 168 hours. Cardiac Enzymes: No results for input(s): CKTOTAL, CKMB, CKMBINDEX, TROPONINI in the last 168 hours. No results for input(s): PROBNP in the last 8760 hours. Coagulation Profile: No results for input(s): INR, PROTIME in the last 168 hours. Thyroid Function Tests: Recent Labs    07/21/21 1409  TSH 1.475   Lipid Profile: No results  for input(s): CHOL, HDL, LDLCALC, TRIG, CHOLHDL, LDLDIRECT in the last 72 hours. Anemia Panel: Recent Labs    07/21/21 2101  VITAMINB12 1,560*   Urine analysis:    Component Value Date/Time   COLORURINE AMBER (A) 07/21/2021 1409   APPEARANCEUR HAZY (A) 07/21/2021 1409   LABSPEC 1.027 07/21/2021 1409   PHURINE 5.0 07/21/2021 1409   GLUCOSEU NEGATIVE 07/21/2021 1409   HGBUR MODERATE (A) 07/21/2021 1409   BILIRUBINUR NEGATIVE 07/21/2021 1409   KETONESUR 20 (A) 07/21/2021 1409   PROTEINUR 30 (A) 07/21/2021 1409   NITRITE NEGATIVE 07/21/2021 1409   LEUKOCYTESUR LARGE (A) 07/21/2021 1409   Sepsis Labs: Invalid input(s): PROCALCITONIN, LACTICIDVEN   Time coordinating discharge: 45 minutes  SIGNED:  Mercy Riding, MD  [facility] 07/23/2021, 6:02 PM

## 2021-07-25 LAB — VITAMIN B1: Vitamin B1 (Thiamine): 132.5 nmol/L (ref 66.5–200.0)

## 2021-07-26 LAB — CULTURE, BLOOD (ROUTINE X 2)
Culture: NO GROWTH
Culture: NO GROWTH
Special Requests: ADEQUATE
Special Requests: ADEQUATE

## 2021-08-15 ENCOUNTER — Other Ambulatory Visit: Payer: Self-pay | Admitting: Internal Medicine

## 2021-08-15 DIAGNOSIS — Z1231 Encounter for screening mammogram for malignant neoplasm of breast: Secondary | ICD-10-CM

## 2021-09-07 ENCOUNTER — Other Ambulatory Visit: Payer: Self-pay | Admitting: Neurology

## 2021-09-07 DIAGNOSIS — R519 Headache, unspecified: Secondary | ICD-10-CM

## 2021-09-14 ENCOUNTER — Other Ambulatory Visit: Payer: Self-pay

## 2021-09-14 ENCOUNTER — Ambulatory Visit
Admission: RE | Admit: 2021-09-14 | Discharge: 2021-09-14 | Disposition: A | Discharge status: Home / Self Care | Payer: Medicare Other | Source: Ambulatory Visit | Attending: Internal Medicine | Admitting: Internal Medicine

## 2021-09-14 DIAGNOSIS — Z1231 Encounter for screening mammogram for malignant neoplasm of breast: Secondary | ICD-10-CM | POA: Diagnosis present

## 2021-09-20 ENCOUNTER — Ambulatory Visit
Admission: RE | Admit: 2021-09-20 | Discharge: 2021-09-20 | Disposition: A | Discharge status: Home / Self Care | Payer: Medicare Other | Source: Ambulatory Visit | Attending: Neurology | Admitting: Neurology

## 2021-09-20 DIAGNOSIS — R519 Headache, unspecified: Secondary | ICD-10-CM

## 2021-11-01 ENCOUNTER — Ambulatory Visit: Payer: Medicare Other | Admitting: Dermatology

## 2021-11-01 ENCOUNTER — Other Ambulatory Visit: Payer: Self-pay

## 2021-11-01 DIAGNOSIS — L578 Other skin changes due to chronic exposure to nonionizing radiation: Secondary | ICD-10-CM | POA: Diagnosis not present

## 2021-11-01 DIAGNOSIS — Z1283 Encounter for screening for malignant neoplasm of skin: Secondary | ICD-10-CM

## 2021-11-01 DIAGNOSIS — D489 Neoplasm of uncertain behavior, unspecified: Secondary | ICD-10-CM

## 2021-11-01 DIAGNOSIS — Z85828 Personal history of other malignant neoplasm of skin: Secondary | ICD-10-CM | POA: Diagnosis not present

## 2021-11-01 DIAGNOSIS — L821 Other seborrheic keratosis: Secondary | ICD-10-CM

## 2021-11-01 DIAGNOSIS — L570 Actinic keratosis: Secondary | ICD-10-CM

## 2021-11-01 DIAGNOSIS — D229 Melanocytic nevi, unspecified: Secondary | ICD-10-CM

## 2021-11-01 DIAGNOSIS — L814 Other melanin hyperpigmentation: Secondary | ICD-10-CM

## 2021-11-01 DIAGNOSIS — D1800 Hemangioma unspecified site: Secondary | ICD-10-CM

## 2021-11-01 NOTE — Progress Notes (Signed)
Follow-Up Visit   Subjective  Abigail Powers is a 81 y.o. female who presents for the following: FBSE (Patient here for full body skin exam and skin cancer screening. Patient with hx of SCC. She does have a red spot at right forehead and at left cheek. Spot at right forehead is sometimes sore and scaly. ).  The following portions of the chart were reviewed this encounter and updated as appropriate:   Tobacco   Allergies   Meds   Problems   Med Hx   Surg Hx   Fam Hx       Review of Systems:  No other skin or systemic complaints except as noted in HPI or Assessment and Plan.  Objective  Well appearing patient in no apparent distress; mood and affect are within normal limits.  A full examination was performed including scalp, head, eyes, ears, nose, lips, neck, chest, axillae, abdomen, back, buttocks, bilateral upper extremities, bilateral lower extremities, hands, feet, fingers, toes, fingernails, and toenails. All findings within normal limits unless otherwise noted below.  right forehead, left cheek x 3, upper chest x 4 (8) Erythematous thin papules/macules with gritty scale.   left lower eyelid underlying Moh's scar 2 small mobile subcutaneous papules 0.3 cm and 0.4cm         Assessment & Plan  AK (actinic keratosis) (8) right forehead, left cheek x 3, upper chest x 4  Prior to procedure, discussed risks of blister formation, small wound, skin dyspigmentation, or rare scar following cryotherapy. Recommend Vaseline ointment to treated areas while healing.  Actinic keratoses are precancerous spots that appear secondary to cumulative UV radiation exposure/sun exposure over time. They are chronic with expected duration over 1 year. A portion of actinic keratoses will progress to squamous cell carcinoma of the skin. It is not possible to reliably predict which spots will progress to skin cancer and so treatment is recommended to prevent development of skin  cancer.  Recommend daily broad spectrum sunscreen SPF 30+ to sun-exposed areas, reapply every 2 hours as needed.  Recommend staying in the shade or wearing long sleeves, sun glasses (UVA+UVB protection) and wide brim hats (4-inch brim around the entire circumference of the hat). Call for new or changing lesions.    Destruction of lesion - right forehead, left cheek x 3, upper chest x 4  Destruction method: cryotherapy   Informed consent: discussed and consent obtained   Lesion destroyed using liquid nitrogen: Yes   Cryotherapy cycles:  2 Outcome: patient tolerated procedure well with no complications   Post-procedure details: wound care instructions given    Neoplasm of uncertain behavior left lower eyelid underlying Moh's scar  DDX hypertrophic scar over cyst over recurrent SCC Monitor for change Will plan punch biopsy if indicated   Lentigines - Scattered tan macules - Due to sun exposure - Benign-appearing, observe - Recommend daily broad spectrum sunscreen SPF 30+ to sun-exposed areas, reapply every 2 hours as needed. - Call for any changes  Seborrheic Keratoses - Stuck-on, waxy, tan-brown papules and/or plaques  - Benign-appearing - Discussed benign etiology and prognosis. - Observe - Call for any changes  Melanocytic Nevi - Tan-brown and/or pink-flesh-colored symmetric macules and papules - Benign appearing on exam today - Observation - Call clinic for new or changing moles - Recommend daily use of broad spectrum spf 30+ sunscreen to sun-exposed areas.   Hemangiomas - Red papules - Discussed benign nature - Observe - Call for any changes  Actinic Damage - Severe,  confluent actinic changes with pre-cancerous actinic keratoses  - Severe, chronic, not at goal, secondary to cumulative UV radiation exposure over time - diffuse scaly erythematous macules and papules with underlying dyspigmentation at chest, will monitor for change - Discussed Prescription "Field  Treatment" for Severe, Chronic Confluent Actinic Changes with Pre-Cancerous Actinic Keratoses Field treatment involves treatment of an entire area of skin that has confluent Actinic Changes (Sun/ Ultraviolet light damage) and PreCancerous Actinic Keratoses by method of PhotoDynamic Therapy (PDT) and/or prescription Topical Chemotherapy agents such as 5-fluorouracil, 5-fluorouracil/calcipotriene, and/or imiquimod.  The purpose is to decrease the number of clinically evident and subclinical PreCancerous lesions to prevent progression to development of skin cancer by chemically destroying early precancer changes that may or may not be visible.  It has been shown to reduce the risk of developing skin cancer in the treated area. As a result of treatment, redness, scaling, crusting, and open sores may occur during treatment course. One or more than one of these methods may be used and may have to be used several times to control, suppress and eliminate the PreCancerous changes. Discussed treatment course, expected reaction, and possible side effects. - Recommend daily broad spectrum sunscreen SPF 30+ to sun-exposed areas, reapply every 2 hours as needed.  - Staying in the shade or wearing long sleeves, sun glasses (UVA+UVB protection) and wide brim hats (4-inch brim around the entire circumference of the hat) are also recommended. - Call for new or changing lesions. - Patient deferred treatment at this time.  Skin cancer screening performed today.  History of Squamous Cell Carcinoma of the Skin - No evidence of recurrence today at left nasal sidewall - No lymphadenopathy - Recommend regular full body skin exams - Recommend daily broad spectrum sunscreen SPF 30+ to sun-exposed areas, reapply every 2 hours as needed.  - Call if any new or changing lesions are noted between office visits    Return for TBSE 6-12 months.  Graciella Belton, RMA, am acting as scribe for Forest Gleason, MD .  Documentation: I  have reviewed the above documentation for accuracy and completeness, and I agree with the above.  Forest Gleason, MD

## 2021-11-01 NOTE — Patient Instructions (Addendum)
Cryotherapy Aftercare  Wash gently with soap and water everyday.   Apply Vaseline and Band-Aid daily until healed.   Prior to procedure, discussed risks of blister formation, small wound, skin dyspigmentation, or rare scar following cryotherapy. Recommend Vaseline ointment to treated areas while healing.  Recommend Abigail Powers  Melanoma ABCDEs  Melanoma is the most dangerous type of skin cancer, and is the leading cause of death from skin disease.  You are more likely to develop melanoma if you: Have light-colored skin, light-colored eyes, or red or blond hair Spend a lot of time in the sun Tan regularly, either outdoors or in a tanning bed Have had blistering sunburns, especially during childhood Have a close family member who has had a melanoma Have atypical moles or large birthmarks  Early detection of melanoma is key since treatment is typically straightforward and cure rates are extremely high if we catch it early.   The first sign of melanoma is often a change in a mole or a new dark spot.  The ABCDE system is a way of remembering the signs of melanoma.  A for asymmetry:  The two halves do not match. B for border:  The edges of the growth are irregular. C for color:  A mixture of colors are present instead of an even brown color. D for diameter:  Melanomas are usually (but not always) greater than 88mm - the size of a pencil eraser. E for evolution:  The spot keeps changing in size, shape, and color.  Please check your skin once per month between visits. You can use a small mirror in front and a large mirror behind you to keep an eye on the back side or your body.   If you see any new or changing lesions before your next follow-up, please call to schedule a visit.  Please continue daily skin protection including broad spectrum sunscreen SPF 30+ to sun-exposed areas, reapplying every 2 hours as needed when you're outdoors.    If You Need Anything After Your  Visit  If you have any questions or concerns for your doctor, please call our main line at [phone] and press option 4 to reach your doctor's medical assistant. If no one answers, please leave a voicemail as directed and we will return your call as soon as possible. Messages left after 4 pm will be answered the following business day.   You may also send [place] a message via [place]. We typically respond to MyChart messages within 1-2 business days.  For prescription refills, please ask your pharmacy to contact our office. Our fax number is [phone].  If you have an urgent issue when the clinic is closed that cannot wait until the next business day, you can page your doctor at the number below.    Please note that while we do our best to be available for urgent issues outside of office hours, we are not available 24/7.   If you have an urgent issue and are unable to reach [place], you may choose to seek medical care at your doctor's office, retail clinic, urgent care center, or emergency room.  If you have a medical emergency, please immediately call 911 or go to the emergency department.  Pager Numbers  - Dr. Nehemiah Massed: [phone]  - Dr. Laurence Ferrari: [phone]  - Dr. Nicole Kindred: [phone]  In the event of inclement weather, please call our main line at [phone] for an update on the status of any delays or closures.  Dermatology Medication  Tips: Please keep the boxes that topical medications come in in order to help keep track of the instructions about where and how to use these. Pharmacies typically print the medication instructions only on the boxes and not directly on the medication tubes.   If your medication is too expensive, please contact our office at [phone] option 4 or send [place] a message through [place].   We are unable to tell what your co-pay for medications will be in advance as this is different depending on your insurance coverage. However, we may be able to find a  substitute medication at lower cost or fill out paperwork to get insurance to cover a needed medication.   If a prior authorization is required to get your medication covered by your insurance company, please allow [place] 1-2 business days to complete this process.  Drug prices often vary depending on where the prescription is filled and some pharmacies may offer cheaper prices.  The website www.goodrx.com contains coupons for medications through different pharmacies. The prices here do not account for what the cost may be with help from insurance (it may be cheaper with your insurance), but the website can give you the price if you did not use any insurance.  - You can print the associated coupon and take it with your prescription to the pharmacy.  - You may also stop by our office during regular business hours and pick up a GoodRx coupon card.  - If you need your prescription sent electronically to a different pharmacy, notify our office through [facility] or by phone at [phone] option 4.     Si Usted Necesita Algo Despus de Su Visita  Tambin puede enviarnos un mensaje a travs de Pharmacist, community. Por lo general respondemos a los mensajes de MyChart en el transcurso de 1 a 2 das hbiles.  Para renovar recetas, por favor pida a su farmacia que se ponga en contacto con nuestra oficina. Harland Dingwall de fax es [place] [phone].  Si tiene un asunto urgente cuando la clnica est cerrada y que no puede esperar hasta el siguiente da hbil, puede llamar/localizar a su doctor(a) al nmero que aparece a continuacin.   Por favor, tenga en cuenta que aunque hacemos todo lo posible para estar disponibles para asuntos urgentes fuera del horario de [place], no estamos disponibles las 24 horas del da, los 7 das de la [place].   Si tiene un problema urgente y no puede comunicarse con nosotros, puede optar por buscar atencin mdica  en el consultorio de su doctor(a), en una clnica privada, en un  centro de atencin urgente o en una sala de emergencias.  Si tiene Engineering geologist, por favor llame inmediatamente al 911 o vaya a la sala de emergencias.  Nmeros de bper  - Dr. Nehemiah Massed: [phone]  - Dra. Moye: [phone]  - Dra. Nicole Kindred: [phone]  En caso de inclemencias del [place], por favor llame a Johnsie Kindred principal al [phone] para una actualizacin sobre el [place] de cualquier retraso o cierre.  Consejos para la medicacin en dermatologa: Por favor, guarde las cajas en las que vienen los medicamentos de uso tpico para ayudarle a seguir las instrucciones sobre dnde y cmo usarlos. Las farmacias generalmente imprimen las instrucciones del medicamento slo en las cajas y no directamente en los tubos del [place].   Si su medicamento es muy caro, por favor, pngase en contacto con Zigmund Daniel llamando al [phone] y presione la opcin 4 o envenos un mensaje a  travs de MyChart.   No podemos decirle cul ser su copago por los medicamentos por adelantado ya que esto es diferente dependiendo de la cobertura de su seguro. Sin embargo, es posible que podamos encontrar un medicamento sustituto a Electrical engineer un formulario para que el seguro cubra el medicamento que se considera necesario.   Si se requiere una autorizacin previa para que su compaa de seguros [place] su medicamento, por favor permtanos de 1 a 2 das hbiles para completar este proceso.  Los precios de los medicamentos varan con frecuencia dependiendo del Environmental consultant de dnde se surte la receta y alguna farmacias pueden ofrecer precios ms baratos.  El sitio web www.goodrx.com tiene cupones para medicamentos de Airline pilot. Los precios aqu no tienen en cuenta lo que podra costar con la ayuda del seguro (puede ser ms barato con su seguro), pero el sitio web puede darle el precio si no utiliz Research scientist (physical sciences).  - Puede imprimir el cupn correspondiente y llevarlo con su receta  a la farmacia.  - Tambin puede pasar por nuestra oficina durante el horario de atencin regular y Charity fundraiser una tarjeta de cupones de GoodRx.  - Si necesita que su receta se enve electrnicamente a una farmacia diferente, informe a nuestra oficina a travs de MyChart de [place] o por telfono llamando al [phone] y presione la opcin 4.

## 2021-11-09 ENCOUNTER — Encounter: Payer: Self-pay | Admitting: Dermatology

## 2021-11-16 ENCOUNTER — Encounter: Payer: Self-pay | Admitting: Ophthalmology

## 2021-11-21 NOTE — Discharge Instructions (Signed)

## 2021-11-23 ENCOUNTER — Ambulatory Visit
Admission: RE | Admit: 2021-11-23 | Discharge: 2021-11-23 | Disposition: A | Discharge status: Home / Self Care | Payer: Medicare Other | Attending: Ophthalmology | Admitting: Ophthalmology

## 2021-11-23 ENCOUNTER — Encounter: Payer: Self-pay | Admitting: Ophthalmology

## 2021-11-23 ENCOUNTER — Ambulatory Visit: Payer: Medicare Other | Admitting: Anesthesiology

## 2021-11-23 ENCOUNTER — Other Ambulatory Visit: Payer: Self-pay

## 2021-11-23 ENCOUNTER — Encounter
Admission: RE | Disposition: A | Discharge status: Home / Self Care | Payer: Self-pay | Source: Home / Self Care | Attending: Ophthalmology

## 2021-11-23 DIAGNOSIS — E785 Hyperlipidemia, unspecified: Secondary | ICD-10-CM | POA: Diagnosis not present

## 2021-11-23 DIAGNOSIS — K219 Gastro-esophageal reflux disease without esophagitis: Secondary | ICD-10-CM | POA: Insufficient documentation

## 2021-11-23 DIAGNOSIS — Z79899 Other long term (current) drug therapy: Secondary | ICD-10-CM | POA: Insufficient documentation

## 2021-11-23 DIAGNOSIS — H2511 Age-related nuclear cataract, right eye: Secondary | ICD-10-CM | POA: Insufficient documentation

## 2021-11-23 DIAGNOSIS — Z7951 Long term (current) use of inhaled steroids: Secondary | ICD-10-CM | POA: Diagnosis not present

## 2021-11-23 DIAGNOSIS — Z87891 Personal history of nicotine dependence: Secondary | ICD-10-CM | POA: Diagnosis not present

## 2021-11-23 DIAGNOSIS — Z8616 Personal history of COVID-19: Secondary | ICD-10-CM | POA: Insufficient documentation

## 2021-11-23 DIAGNOSIS — J449 Chronic obstructive pulmonary disease, unspecified: Secondary | ICD-10-CM | POA: Insufficient documentation

## 2021-11-23 HISTORY — PX: CATARACT EXTRACTION W/PHACO: SHX586

## 2021-11-23 HISTORY — DX: Presence of external hearing-aid: Z97.4

## 2021-11-23 HISTORY — DX: Unspecified cirrhosis of liver: K74.60

## 2021-11-23 SURGERY — PHACOEMULSIFICATION, CATARACT, WITH IOL INSERTION
Anesthesia: Monitor Anesthesia Care | Site: Eye | Laterality: Right

## 2021-11-23 MED ORDER — ARMC OPHTHALMIC DILATING DROPS
1.0000 "application " | OPHTHALMIC | Status: DC | PRN
  Administered 2021-11-23 (×3): 1 via OPHTHALMIC

## 2021-11-23 MED ORDER — SIGHTPATH DOSE#1 BSS IO SOLN
INTRAOCULAR | Status: DC | PRN
  Administered 2021-11-23: 15 mL

## 2021-11-23 MED ORDER — SIGHTPATH DOSE#1 NA HYALUR & NA CHOND-NA HYALUR IO KIT
PACK | INTRAOCULAR | Status: DC | PRN
  Administered 2021-11-23: 1 via OPHTHALMIC

## 2021-11-23 MED ORDER — MOXIFLOXACIN HCL 0.5 % OP SOLN
OPHTHALMIC | Status: DC | PRN
Start: 2021-11-23 — End: 2021-11-23
  Administered 2021-11-23: 0.2 mL via OPHTHALMIC

## 2021-11-23 MED ORDER — SIGHTPATH DOSE#1 BSS IO SOLN
INTRAOCULAR | Status: DC | PRN
  Administered 2021-11-23: 72 mL via OPHTHALMIC

## 2021-11-23 MED ORDER — SIGHTPATH DOSE#1 BSS IO SOLN
INTRAOCULAR | Status: DC | PRN
  Administered 2021-11-23: 1 mL via INTRAMUSCULAR

## 2021-11-23 MED ORDER — TETRACAINE HCL 0.5 % OP SOLN
1.0000 [drp] | OPHTHALMIC | Status: DC | PRN
  Administered 2021-11-23 (×3): 1 [drp] via OPHTHALMIC

## 2021-11-23 MED ORDER — MIDAZOLAM HCL 2 MG/2ML IJ SOLN
INTRAMUSCULAR | Status: DC | PRN
  Administered 2021-11-23: 1 mg via INTRAVENOUS

## 2021-11-23 MED ORDER — LACTATED RINGERS IV SOLN: INTRAVENOUS | Status: DC

## 2021-11-23 MED ORDER — FENTANYL CITRATE (PF) 100 MCG/2ML IJ SOLN
INTRAMUSCULAR | Status: DC | PRN
  Administered 2021-11-23: 50 ug via INTRAVENOUS

## 2021-11-23 MED ORDER — BRIMONIDINE TARTRATE-TIMOLOL 0.2-0.5 % OP SOLN
OPHTHALMIC | Status: DC | PRN
Start: 2021-11-23 — End: 2021-11-23
  Administered 2021-11-23: 1 [drp] via OPHTHALMIC

## 2021-11-23 SURGICAL SUPPLY — 11 items
CATARACT SUITE SIGHTPATH (MISCELLANEOUS) ×2 IMPLANT
FEE CATARACT SUITE SIGHTPATH (MISCELLANEOUS) ×1 IMPLANT
GLOVE SRG 8 PF TXTR STRL LF DI (GLOVE) ×1 IMPLANT
GLOVE SURG ENC TEXT LTX SZ7.5 (GLOVE) ×2 IMPLANT
GLOVE SURG UNDER POLY LF SZ8 (GLOVE) ×2
LENS IOL TECNIS EYHANCE 28.0 (Intraocular Lens) ×1 IMPLANT
NDL FILTER BLUNT 18X1 1/2 (NEEDLE) ×1 IMPLANT
NEEDLE FILTER BLUNT 18X 1/2SAF (NEEDLE) ×1
NEEDLE FILTER BLUNT 18X1 1/2 (NEEDLE) ×1 IMPLANT
SYR 3ML LL SCALE MARK (SYRINGE) ×2 IMPLANT
WATER STERILE IRR 250ML POUR (IV SOLUTION) ×2 IMPLANT

## 2021-11-23 NOTE — Anesthesia Postprocedure Evaluation (Signed)
Anesthesia Post Note  Patient: Abigail Powers  Procedure(s) Performed: CATARACT EXTRACTION PHACO AND INTRAOCULAR LENS PLACEMENT (IOC)RIGHT (Right: Eye)     Patient location during evaluation: PACU Anesthesia Type: MAC Level of consciousness: awake and alert and oriented Pain management: satisfactory to patient Vital Signs Assessment: post-procedure vital signs reviewed and stable Respiratory status: spontaneous breathing, nonlabored ventilation and respiratory function stable Cardiovascular status: blood pressure returned to baseline and stable Postop Assessment: Adequate PO intake and No signs of nausea or vomiting Anesthetic complications: no   No notable events documented.  Raliegh Ip

## 2021-11-23 NOTE — Op Note (Signed)
LOCATION:  [place]   PREOPERATIVE DIAGNOSIS:    Nuclear sclerotic cataract right eye. H25.11   POSTOPERATIVE DIAGNOSIS:  Nuclear sclerotic cataract right eye.     PROCEDURE:  Phacoemusification with posterior chamber intraocular lens placement of the right eye   ULTRASOUND TIME: Procedure(s) with comments: CATARACT EXTRACTION PHACO AND INTRAOCULAR LENS PLACEMENT (IOC)RIGHT (Right) - 7.34 01:06.9  LENS:   Implant Name Type Inv. Item Serial No. Manufacturer Lot No. LRB No. Used Action  LENS IOL TECNIS EYHANCE 28.0 - [id number] Intraocular Lens LENS IOL TECNIS EYHANCE 28.0 [id number] [employer]  Right 1 Implanted         SURGEON:  Wyonia Hough, MD   ANESTHESIA:  Topical with tetracaine drops and 2% Xylocaine jelly, augmented with 1% preservative-free intracameral lidocaine.    COMPLICATIONS:  None.   DESCRIPTION OF PROCEDURE:  The patient was identified in the holding room and transported to the operating room and placed in the supine position under the operating microscope.  The right eye was identified as the operative eye and it was prepped and draped in the usual sterile ophthalmic fashion.   A 1 millimeter clear-corneal paracentesis was made at the 12:00 position.  0.5 ml of preservative-free 1% lidocaine was injected into the anterior chamber. The anterior chamber was filled with Viscoat viscoelastic.  A 2.4 millimeter keratome was used to make a near-clear corneal incision at the 9:00 position.  A curvilinear capsulorrhexis was made with a cystotome and capsulorrhexis forceps.  Balanced salt solution was used to hydrodissect and hydrodelineate the nucleus.   Phacoemulsification was then used in stop and chop fashion to remove the lens nucleus and epinucleus.  The remaining cortex was then removed using the irrigation and aspiration handpiece. Provisc was then placed into the capsular bag to distend it for lens placement.  A lens was then injected into the  capsular bag.  The remaining viscoelastic was aspirated.   Wounds were hydrated with balanced salt solution.  The anterior chamber was inflated to a physiologic pressure with balanced salt solution.  No wound leaks were noted. Vigamox 0.2 ml of a 1mg  per ml solution was injected into the anterior chamber for a dose of 0.2 mg of intracameral antibiotic at the completion of the case.   Timolol and Brimonidine drops were applied to the eye.  The patient was taken to the recovery room in stable condition without complications of anesthesia or surgery.   Meenakshi Sazama 11/23/2021, 8:55 AM

## 2021-11-23 NOTE — Anesthesia Preprocedure Evaluation (Signed)
Anesthesia Evaluation  Patient identified by MRN, date of birth, ID band Patient awake    Reviewed: Allergy & Precautions, H&P , NPO status , Patient's Chart, lab work & pertinent test results  Airway Mallampati: II  TM Distance: >3 FB Neck ROM: full    Dental no notable dental hx.    Pulmonary COPD,  COPD inhaler, Patient abstained from smoking.,    Pulmonary exam normal breath sounds clear to auscultation       Cardiovascular Normal cardiovascular exam Rhythm:regular Rate:Normal     Neuro/Psych    GI/Hepatic GERD  ,(+) Cirrhosis       ,   Endo/Other    Renal/GU      Musculoskeletal   Abdominal   Peds  Hematology   Anesthesia Other Findings   Reproductive/Obstetrics                             Anesthesia Physical Anesthesia Plan  ASA: 3  Anesthesia Plan: MAC   Post-op Pain Management: Minimal or no pain anticipated   Induction:   PONV Risk Score and Plan: 2 and Treatment may vary due to age or medical condition, TIVA and Midazolam  Airway Management Planned:   Additional Equipment:   Intra-op Plan:   Post-operative Plan:   Informed Consent: I have reviewed the patients History and Physical, chart, labs and discussed the procedure including the risks, benefits and alternatives for the proposed anesthesia with the patient or authorized representative who has indicated his/her understanding and acceptance.     Dental Advisory Given  Plan Discussed with: CRNA  Anesthesia Plan Comments:         Anesthesia Quick Evaluation

## 2021-11-23 NOTE — Transfer of Care (Signed)
Immediate Anesthesia Transfer of Care Note  Patient: Abigail Powers  Procedure(s) Performed: CATARACT EXTRACTION PHACO AND INTRAOCULAR LENS PLACEMENT (IOC)RIGHT (Right: Eye)  Patient Location: PACU  Anesthesia Type: MAC  Level of Consciousness: awake, alert  and patient cooperative  Airway and Oxygen Therapy: Patient Spontanous Breathing and Patient connected to supplemental oxygen  Post-op Assessment: Post-op Vital signs reviewed, Patient's Cardiovascular Status Stable, Respiratory Function Stable, Patent Airway and No signs of Nausea or vomiting  Post-op Vital Signs: Reviewed and stable  Complications: No notable events documented.

## 2021-11-23 NOTE — H&P (Signed)
[place]   Primary Care Physician:  Rusty Aus, MD Ophthalmologist: Dr. Leandrew Koyanagi  Pre-Procedure History & Physical: HPI:  Abigail Powers is a 81 y.o. female here for ophthalmic surgery.   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 Authorizing Provider  atorvastatin (LIPITOR) 20 MG tablet Take by mouth. 01/28/20  Yes [provider]  budesonide-formoterol (SYMBICORT) 160-4.5 MCG/ACT inhaler Inhale 2 puffs into the lungs 2 (two) times daily.   Yes [provider]  Cholecalciferol (VITAMIN D-3 PO) Take by mouth daily.   Yes [provider]  clonazePAM (KLONOPIN) 1 MG tablet Take 1 mg by mouth at bedtime as needed. Takes 1/2 tab 06/30/19  Yes [provider]  dicyclomine (BENTYL) 10 MG capsule TAKE (1) CAPSULE BY MOUTH FOUR TIMES A DAY BEFORE MEALS AND AT BEDTIME 11/11/18  Yes Jonathon Bellows, MD  escitalopram (LEXAPRO) 5 MG tablet Take 5 mg by mouth daily.   Yes [provider]  fexofenadine (ALLEGRA) 60 MG tablet Take 60 mg by mouth 2 (two) times daily as needed for allergies or rhinitis.   Yes [provider]  FIBER ADULT GUMMIES PO Take by  mouth.   Yes [provider]  fluticasone (FLONASE) 50 MCG/ACT nasal spray Place into both nostrils daily.   Yes [provider]  gentamicin cream (GARAMYCIN) 0.1 % Apply 1 application topically 2 (two) times daily. 09/23/19  Yes Edrick Kins, DPM  HYDROcodone-acetaminophen (NORCO/VICODIN) 5-325 MG tablet Take 1 tablet by mouth every 6 (six) hours as needed for moderate pain.   Yes [provider]  magnesium oxide (MAG-OX) 400 MG tablet Take by mouth.   Yes [provider]  mupirocin ointment (BACTROBAN) 2 % Apply to wound twice a day. 09/03/19  Yes Hyatt, Max T, DPM  pantoprazole (PROTONIX) 40 MG tablet  10/24/18  Yes [provider]  polyethylene glycol powder (GLYCOLAX/MIRALAX) 17 GM/SCOOP powder Take 1 Container by mouth once.   Yes [provider]  SIMETHICONE PO Take by mouth as needed.   Yes [provider]  SUMAtriptan (IMITREX) 50 MG tablet Take 50 mg by mouth every 2 (two) hours as needed for migraine. May repeat in 2 hours if headache persists or recurs.   Yes [provider]  traZODone (DESYREL) 50 MG tablet Take 50 mg by mouth at bedtime.   Yes [provider]  VENTOLIN HFA 108 (90 Base) MCG/ACT inhaler  05/29/19  Yes [provider]  clobetasol ointment (TEMOVATE) 4.97 % Apply 1 application topically 2 (two) times daily. For up to 2 weeks as needed for itch. Avoid applying to face, groin, and axilla. Use  as directed. Risk of skin atrophy with long-term use reviewed. Patient not taking: Reported on 07/21/2021 05/19/21   Laurence Ferrari, [place], MD  hydrocortisone (ANUSOL-HC) 2.5 % rectal cream  05/29/19   [provider]  meclizine (ANTIVERT) 25 MG tablet  07/08/19   [provider]    Allergies as of 10/24/2021 - Review Complete 07/22/2021  Allergen Reaction Noted   Penicillins Rash 08/16/2015    Family History  Problem Relation Age of Onset   Heart disease Father    Breast cancer  Neg Hx     Social History   Socioeconomic History   Marital status: Married    Spouse name: Not on file   Number of children: Not on file   Years of education: Not on file   Highest education level: Not on file  Occupational History   Not on file  Tobacco Use   Smoking status: Never   Smokeless tobacco: Never  Vaping Use   Vaping Use: Never used  Substance and Sexual Activity   Alcohol use: No   Drug use: No   Sexual activity: Not Currently  Other Topics Concern   Not on file  Social History Narrative   Not on file   Social Determinants of Health   Financial Resource Strain: Not on file  Food Insecurity: Not on file  Transportation Needs: Not on file  Physical Activity: Not on file  Stress: Not on file  Social Connections: Not on file  Intimate Partner Violence: Not on file    Review of Systems: See HPI, otherwise negative ROS  Physical Exam: BP (!) 141/65    Pulse 71    Temp (!) 97.5 F (36.4 C) (Temporal)    Resp 20    Ht 5' 4.5" (1.638 m)    Wt 47.2 kg    SpO2 100%    BMI 17.58 kg/m  General:   Alert,  pleasant and cooperative in NAD Head:  Normocephalic and atraumatic. Lungs:  Clear to auscultation.    Heart:  Regular rate and rhythm.   Impression/Plan: Abigail Powers is here for ophthalmic surgery.  Risks, benefits, limitations, and alternatives regarding ophthalmic surgery have been reviewed with the patient.  Questions have been answered.  All parties agreeable.   Leandrew Koyanagi, MD  11/23/2021, 7:36 AM

## 2021-11-24 ENCOUNTER — Other Ambulatory Visit: Payer: Self-pay

## 2021-11-24 ENCOUNTER — Encounter: Payer: Self-pay | Admitting: Ophthalmology

## 2021-12-06 NOTE — Discharge Instructions (Signed)

## 2021-12-07 ENCOUNTER — Encounter
Admission: RE | Disposition: A | Discharge status: Home / Self Care | Payer: Self-pay | Source: Home / Self Care | Attending: Ophthalmology

## 2021-12-07 ENCOUNTER — Encounter: Payer: Self-pay | Admitting: Ophthalmology

## 2021-12-07 ENCOUNTER — Ambulatory Visit: Payer: Medicare Other | Admitting: Anesthesiology

## 2021-12-07 ENCOUNTER — Ambulatory Visit
Admission: RE | Admit: 2021-12-07 | Discharge: 2021-12-07 | Disposition: A | Discharge status: Home / Self Care | Payer: Medicare Other | Attending: Ophthalmology | Admitting: Ophthalmology

## 2021-12-07 ENCOUNTER — Other Ambulatory Visit: Payer: Self-pay

## 2021-12-07 DIAGNOSIS — K219 Gastro-esophageal reflux disease without esophagitis: Secondary | ICD-10-CM | POA: Diagnosis not present

## 2021-12-07 DIAGNOSIS — J449 Chronic obstructive pulmonary disease, unspecified: Secondary | ICD-10-CM | POA: Insufficient documentation

## 2021-12-07 DIAGNOSIS — K7460 Unspecified cirrhosis of liver: Secondary | ICD-10-CM | POA: Diagnosis not present

## 2021-12-07 DIAGNOSIS — H2512 Age-related nuclear cataract, left eye: Secondary | ICD-10-CM | POA: Diagnosis present

## 2021-12-07 DIAGNOSIS — F419 Anxiety disorder, unspecified: Secondary | ICD-10-CM | POA: Diagnosis not present

## 2021-12-07 HISTORY — PX: CATARACT EXTRACTION W/PHACO: SHX586

## 2021-12-07 SURGERY — PHACOEMULSIFICATION, CATARACT, WITH IOL INSERTION
Anesthesia: Monitor Anesthesia Care | Site: Eye | Laterality: Left

## 2021-12-07 MED ORDER — ARMC OPHTHALMIC DILATING DROPS
1.0000 "application " | OPHTHALMIC | Status: DC | PRN
  Administered 2021-12-07 (×3): 1 via OPHTHALMIC

## 2021-12-07 MED ORDER — OXYCODONE HCL 5 MG PO TABS: 5.0000 mg | ORAL_TABLET | Freq: Once | ORAL | Status: DC | PRN

## 2021-12-07 MED ORDER — OXYCODONE HCL 5 MG/5ML PO SOLN: 5.0000 mg | Freq: Once | ORAL | Status: DC | PRN

## 2021-12-07 MED ORDER — SIGHTPATH DOSE#1 BSS IO SOLN
INTRAOCULAR | Status: DC | PRN
  Administered 2021-12-07: 69 mL via OPHTHALMIC

## 2021-12-07 MED ORDER — MIDAZOLAM HCL 2 MG/2ML IJ SOLN
INTRAMUSCULAR | Status: DC | PRN
  Administered 2021-12-07: 1 mg via INTRAVENOUS

## 2021-12-07 MED ORDER — SIGHTPATH DOSE#1 NA HYALUR & NA CHOND-NA HYALUR IO KIT
PACK | INTRAOCULAR | Status: DC | PRN
  Administered 2021-12-07: 1 via OPHTHALMIC

## 2021-12-07 MED ORDER — TETRACAINE HCL 0.5 % OP SOLN
1.0000 [drp] | OPHTHALMIC | Status: DC | PRN
  Administered 2021-12-07 (×3): 1 [drp] via OPHTHALMIC

## 2021-12-07 MED ORDER — LACTATED RINGERS IV SOLN: INTRAVENOUS | Status: DC

## 2021-12-07 MED ORDER — MOXIFLOXACIN HCL 0.5 % OP SOLN
OPHTHALMIC | Status: DC | PRN
  Administered 2021-12-07: 0.2 mL via OPHTHALMIC

## 2021-12-07 MED ORDER — FENTANYL CITRATE (PF) 100 MCG/2ML IJ SOLN
INTRAMUSCULAR | Status: DC | PRN
  Administered 2021-12-07: 50 ug via INTRAVENOUS

## 2021-12-07 MED ORDER — SIGHTPATH DOSE#1 BSS IO SOLN
INTRAOCULAR | Status: DC | PRN
  Administered 2021-12-07: 15 mL via INTRAOCULAR

## 2021-12-07 MED ORDER — NEOMYCIN-POLYMYXIN-DEXAMETH 3.5-10000-0.1 OP OINT
TOPICAL_OINTMENT | OPHTHALMIC | Status: DC | PRN
  Administered 2021-12-07: 1 via OPHTHALMIC

## 2021-12-07 MED ORDER — SIGHTPATH DOSE#1 BSS IO SOLN
INTRAOCULAR | Status: DC | PRN
  Administered 2021-12-07: 2 mL

## 2021-12-07 MED ORDER — BRIMONIDINE TARTRATE-TIMOLOL 0.2-0.5 % OP SOLN
OPHTHALMIC | Status: DC | PRN
  Administered 2021-12-07: 1 [drp] via OPHTHALMIC

## 2021-12-07 SURGICAL SUPPLY — 14 items
CANNULA ANT/CHMB 27G (MISCELLANEOUS) IMPLANT
CANNULA ANT/CHMB 27GA (MISCELLANEOUS) IMPLANT
CATARACT SUITE SIGHTPATH (MISCELLANEOUS) ×2 IMPLANT
FEE CATARACT SUITE SIGHTPATH (MISCELLANEOUS) ×1 IMPLANT
GLOVE SRG 8 PF TXTR STRL LF DI (GLOVE) ×1 IMPLANT
GLOVE SURG ENC TEXT LTX SZ7.5 (GLOVE) ×2 IMPLANT
GLOVE SURG GAMMEX PI TX LF 7.5 (GLOVE) IMPLANT
GLOVE SURG UNDER POLY LF SZ8 (GLOVE) ×2
LENS IOL TECNIS EYHANCE 24.0 (Intraocular Lens) ×1 IMPLANT
NDL FILTER BLUNT 18X1 1/2 (NEEDLE) ×1 IMPLANT
NEEDLE FILTER BLUNT 18X 1/2SAF (NEEDLE) ×1
NEEDLE FILTER BLUNT 18X1 1/2 (NEEDLE) ×1 IMPLANT
SYR 3ML LL SCALE MARK (SYRINGE) ×2 IMPLANT
WATER STERILE IRR 250ML POUR (IV SOLUTION) ×2 IMPLANT

## 2021-12-07 NOTE — Transfer of Care (Signed)
Immediate Anesthesia Transfer of Care Note ? ?Patient: Abigail Powers ? ?Procedure(s) Performed: CATARACT EXTRACTION PHACO AND INTRAOCULAR LENS PLACEMENT (IOC) LEFT 5.80 01:01.9 (Left: Eye) ? ?Patient Location: PACU ? ?Anesthesia Type: MAC ? ?Level of Consciousness: awake, alert  and patient cooperative ? ?Airway and Oxygen Therapy: Patient Spontanous Breathing and Patient connected to supplemental oxygen ? ?Post-op Assessment: Post-op Vital signs reviewed, Patient's Cardiovascular Status Stable, Respiratory Function Stable, Patent Airway and No signs of Nausea or vomiting ? ?Post-op Vital Signs: Reviewed and stable ? ?Complications: No notable events documented. ? ?

## 2021-12-07 NOTE — Op Note (Signed)
OPERATIVE NOTE ? ?Abigail Powers ?[id number] ?12/07/2021 ? ? ?PREOPERATIVE DIAGNOSIS:  Nuclear sclerotic cataract left eye. H25.12 ?  ?POSTOPERATIVE DIAGNOSIS:    Nuclear sclerotic cataract left eye.   ?  ?PROCEDURE:  Phacoemusification with posterior chamber intraocular lens placement of the left eye  ?Ultrasound time: Procedure(s): ?CATARACT EXTRACTION PHACO AND INTRAOCULAR LENS PLACEMENT (IOC) LEFT 5.80 01:01.9 (Left) ? ?LENS:   ?Implant Name Type Inv. Item Serial No. Manufacturer Lot No. LRB No. Used Action  ?LENS IOL TECNIS EYHANCE 24.0 - [id number] Intraocular Lens LENS IOL TECNIS EYHANCE 24.0 [id number] [employer]  Left 1 Implanted  ?   ? ?SURGEON:  Wyonia Hough, MD ?  ?ANESTHESIA:  Topical with tetracaine drops and 2% Xylocaine jelly, augmented with 1% preservative-free intracameral lidocaine. ? ?  ?COMPLICATIONS:  None. ?  ?DESCRIPTION OF PROCEDURE:  The patient was identified in the holding room and transported to the operating room and placed in the supine position under the operating microscope.  The left eye was identified as the operative eye and it was prepped and draped in the usual sterile ophthalmic fashion. ?  ?A 1 millimeter clear-corneal paracentesis was made at the 1:30 position.  0.5 ml of preservative-free 1% lidocaine was injected into the anterior chamber. ? The anterior chamber was filled with Viscoat viscoelastic.  A 2.4 millimeter keratome was used to make a near-clear corneal incision at the 10:30 position.  .  A curvilinear capsulorrhexis was made with a cystotome and capsulorrhexis forceps.  Balanced salt solution was used to hydrodissect and hydrodelineate the nucleus. ?  ?Phacoemulsification was then used in stop and chop fashion to remove the lens nucleus and epinucleus.  The remaining cortex was then removed using the irrigation and aspiration handpiece. Provisc was then placed into the capsular bag to distend it for lens placement.  A lens was then injected into  the capsular bag.  The remaining viscoelastic was aspirated. ?  ?Wounds were hydrated with balanced salt solution.  The anterior chamber was inflated to a physiologic pressure with balanced salt solution.  No wound leaks were noted. Vigamox 0.2 ml of a '1mg'$  per ml solution was injected into the anterior chamber for a dose of 0.2 mg of intracameral antibiotic at the completion of the case. ?  Timolol and Brimonidine drops and Maxitrol ointment were applied to the eye.  The patient was taken to the recovery room in stable condition without complications of anesthesia or surgery. ? ?Abigail Powers ?12/07/2021, 12:02 PM ? ?

## 2021-12-07 NOTE — Anesthesia Preprocedure Evaluation (Signed)
Anesthesia Evaluation  ?Patient identified by MRN, date of birth, ID band ?Patient awake ? ? ? ?Reviewed: ?NPO status  ? ?History of Anesthesia Complications ?Negative for: history of anesthetic complications ? ?Airway ?Mallampati: II ? ?TM Distance: >3 FB ?Neck ROM: full ? ? ? Dental ?no notable dental hx. ? ?  ?Pulmonary ?COPD (Copd, bronchiectasis ), Patient abstained from smoking.,  ?  ?Pulmonary exam normal ? ? ? ? ? ? ? Cardiovascular ?Exercise Tolerance: Good ?negative cardio ROS ?Normal cardiovascular exam ? ? ?  ?Neuro/Psych ? Headaches, Anxiety HOH ?negative psych ROS  ? GI/Hepatic ?GERD  Controlled,(+) Cirrhosis  (mild ?) ?  ?  ? ,   ?Endo/Other  ?negative endocrine ROS ? Renal/GU ?negative Renal ROS  ?negative genitourinary ?  ?Musculoskeletal ? ?(+) Arthritis ,  ? Abdominal ?  ?Peds ? Hematology ?negative hematology ROS ?(+)   ?Anesthesia Other Findings ?pulm: 11/2021: [place];  ? ?had ecce feb 2023; ? Reproductive/Obstetrics ? ?  ? ? ? ? ? ? ? ? ? ? ? ? ? ?  ?  ? ? ? ? ? ? ? ? ?Anesthesia Physical ?Anesthesia Plan ? ?ASA: 3 ? ?Anesthesia Plan: MAC  ? ?Post-op Pain Management:   ? ?Induction:  ? ?PONV Risk Score and Plan: 2 and TIVA and Midazolam ? ?Airway Management Planned:  ? ?Additional Equipment:  ? ?Intra-op Plan:  ? ?Post-operative Plan:  ? ?Informed Consent: I have reviewed the patients History and Physical, chart, labs and discussed the procedure including the risks, benefits and alternatives for the proposed anesthesia with the patient or authorized representative who has indicated his/her understanding and acceptance.  ? ? ? ? ? ?Plan Discussed with: CRNA ? ?Anesthesia Plan Comments:   ? ? ? ? ? ? ?Anesthesia Quick Evaluation ? ?

## 2021-12-07 NOTE — H&P (Signed)
?[facility]  ? ?Primary Care Physician:  Rusty Aus, MD ?Ophthalmologist: Dr. Leandrew Koyanagi ? ?Pre-Procedure History & Physical: ?HPI:  Abigail Powers is a 81 y.o. female here for ophthalmic surgery. ?  ?Past Medical History:  ?Diagnosis Date  ? Asthma   ? Cirrhosis of liver not due to alcohol ([place])   ? COPD (chronic obstructive pulmonary disease) ([place])   ? COVID-19 11/2020  ? GERD (gastroesophageal reflux disease)   ? 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 left nasal sidewall extending to eyelid / mohs surgery complete 08/24/20   ? Hyperlipidemia   ? Wears hearing aid in both ears   ? ? ?Past Surgical History:  ?Procedure Laterality Date  ? ABDOMINAL HYSTERECTOMY    ? APPENDECTOMY    ? CATARACT EXTRACTION W/PHACO Right 11/23/2021  ? Procedure: CATARACT EXTRACTION PHACO AND INTRAOCULAR LENS PLACEMENT (IOC)RIGHT;  Surgeon: Leandrew Koyanagi, MD;  Location: [place];  Service: Ophthalmology;  Laterality: Right;  7.34 ?01:06.9  ? CHOLECYSTECTOMY    ? ESOPHAGOGASTRODUODENOSCOPY (EGD) WITH PROPOFOL N/A 08/31/2016  ? Procedure: ESOPHAGOGASTRODUODENOSCOPY (EGD) WITH PROPOFOL;  Surgeon: Jonathon Bellows, MD;  Location: [facility];  Service: Endoscopy;  Laterality: N/A;  ? ? ?Prior to Admission medications   ?Medication Sig Start Date End Date Taking? Authorizing Provider  ?atorvastatin (LIPITOR) 20 MG tablet Take by mouth. 01/28/20  Yes [provider]  ?budesonide-formoterol (SYMBICORT) 160-4.5 MCG/ACT inhaler Inhale 2 puffs into the lungs 2 (two) times daily.   Yes [provider]  ?Cholecalciferol (VITAMIN D-3 PO) Take by mouth daily.   Yes [provider]  ?clobetasol ointment (TEMOVATE) 3.50 % Apply 1 application topically 2 (two) times daily. For up to 2 weeks as needed for itch. Avoid applying to face, groin, and axilla. Use as directed. Risk of skin atrophy with long-term use reviewed. 05/19/21  Yes Moye, [place], MD   ?clonazePAM (KLONOPIN) 1 MG tablet Take 1 mg by mouth at bedtime as needed. Takes 1/2 tab 06/30/19  Yes [provider]  ?dicyclomine (BENTYL) 10 MG capsule TAKE (1) CAPSULE BY MOUTH FOUR TIMES A DAY BEFORE MEALS AND AT BEDTIME 11/11/18  Yes Jonathon Bellows, MD  ?escitalopram (LEXAPRO) 5 MG tablet Take 5 mg by mouth daily.   Yes [provider]  ?fexofenadine (ALLEGRA) 60 MG tablet Take 60 mg by mouth 2 (two) times daily as needed for allergies or rhinitis.   Yes [provider]  ?FIBER ADULT GUMMIES PO Take by mouth.   Yes [provider]  ?fluticasone (FLONASE) 50 MCG/ACT nasal spray Place into both nostrils daily.   Yes [provider]  ?gentamicin cream (GARAMYCIN) 0.1 % Apply 1 application topically 2 (two) times daily. 09/23/19  Yes Edrick Kins, DPM  ?HYDROcodone-acetaminophen (NORCO/VICODIN) 5-325 MG tablet Take 1 tablet by mouth every 6 (six) hours as needed for moderate pain.   Yes [provider]  ?hydrocortisone (ANUSOL-HC) 2.5 % rectal cream  05/29/19  Yes [provider]  ?magnesium oxide (MAG-OX) 400 MG tablet Take by mouth.   Yes [provider]  ?meclizine (ANTIVERT) 25 MG tablet  07/08/19  Yes [provider]  ?mupirocin ointment (BACTROBAN) 2 % Apply to wound twice a day. 09/03/19  Yes Hyatt, Max T, DPM  ?pantoprazole (PROTONIX) 40 MG tablet  10/24/18  Yes [provider]  ?polyethylene glycol powder (GLYCOLAX/MIRALAX) 17 GM/SCOOP powder Take 1 Container by mouth once.   Yes [provider]  ?SIMETHICONE PO  Take by mouth as needed.   Yes [provider]  ?SUMAtriptan (IMITREX) 50 MG tablet Take 50 mg by mouth every 2 (two) hours as needed for migraine. May repeat in 2 hours if headache persists or recurs.   Yes [provider]  ?traZODone (DESYREL) 50 MG tablet Take 50 mg by mouth at bedtime.   Yes [provider]  ?VENTOLIN HFA 108 (90 Base) MCG/ACT inhaler  05/29/19  Yes  [provider]  ? ? ?Allergies as of 10/24/2021 - Review Complete 07/22/2021  ?Allergen Reaction Noted  ? Penicillins Rash 08/16/2015  ? ? ?Family History  ?Problem Relation Age of Onset  ? Heart disease Father   ? Breast cancer Neg Hx   ? ? ?Social History  ? ?Socioeconomic History  ? Marital status: Married  ?  Spouse name: Not on file  ? Number of children: Not on file  ? Years of education: Not on file  ? Highest education level: Not on file  ?Occupational History  ? Not on file  ?Tobacco Use  ? Smoking status: Never  ? Smokeless tobacco: Never  ?Vaping Use  ? Vaping Use: Never used  ?Substance and Sexual Activity  ? Alcohol use: No  ? Drug use: No  ? Sexual activity: Not Currently  ?Other Topics Concern  ? Not on file  ?Social History Narrative  ? Not on file  ? ?Social Determinants of Health  ? ?Financial Resource Strain: Not on file  ?Food Insecurity: Not on file  ?Transportation Needs: Not on file  ?Physical Activity: Not on file  ?Stress: Not on file  ?Social Connections: Not on file  ?Intimate Partner Violence: Not on file  ? ? ?Review of Systems: ?See HPI, otherwise negative ROS ? ?Physical Exam: ?BP (!) 146/69   Pulse 66   Temp 98.2 ?F (36.8 ?C) (Temporal)   Wt 46.7 kg   SpO2 98%   BMI 17.41 kg/m?  ?General:   Alert,  pleasant and cooperative in NAD ?Head:  Normocephalic and atraumatic. ?Lungs:  Clear to auscultation.    ?Heart:  Regular rate and rhythm.  ? ?Impression/Plan: ?Abigail Powers is here for ophthalmic surgery. ? ?Risks, benefits, limitations, and alternatives regarding ophthalmic surgery have been reviewed with the patient.  Questions have been answered.  All parties agreeable. ? ? Leandrew Koyanagi, MD  12/07/2021, 10:44 AM ? ?

## 2021-12-07 NOTE — Anesthesia Postprocedure Evaluation (Signed)
Anesthesia Post Note ? ?Patient: Abigail Powers ? ?Procedure(s) Performed: CATARACT EXTRACTION PHACO AND INTRAOCULAR LENS PLACEMENT (IOC) LEFT 5.80 01:01.9 (Left: Eye) ? ? ?  ?Patient location during evaluation: PACU ?Anesthesia Type: MAC ?Level of consciousness: awake and alert ?Pain management: pain level controlled ?Vital Signs Assessment: post-procedure vital signs reviewed and stable ?Respiratory status: spontaneous breathing, nonlabored ventilation, respiratory function stable and patient connected to nasal cannula oxygen ?Cardiovascular status: stable and blood pressure returned to baseline ?Postop Assessment: no apparent nausea or vomiting ?Anesthetic complications: no ? ? ?No notable events documented. ? ?Fidel Levy ? ? ? ? ? ?

## 2021-12-08 ENCOUNTER — Encounter: Payer: Self-pay | Admitting: Ophthalmology

## 2021-12-25 ENCOUNTER — Emergency Department
Admission: EM | Admit: 2021-12-25 | Discharge: 2021-12-25 | Disposition: A | Discharge status: Home / Self Care | Payer: Medicare Other | Attending: Emergency Medicine | Admitting: Emergency Medicine

## 2021-12-25 ENCOUNTER — Encounter: Payer: Self-pay | Admitting: Emergency Medicine

## 2021-12-25 ENCOUNTER — Other Ambulatory Visit: Payer: Self-pay

## 2021-12-25 ENCOUNTER — Emergency Department: Payer: Medicare Other

## 2021-12-25 DIAGNOSIS — R059 Cough, unspecified: Secondary | ICD-10-CM | POA: Diagnosis present

## 2021-12-25 DIAGNOSIS — J441 Chronic obstructive pulmonary disease with (acute) exacerbation: Secondary | ICD-10-CM | POA: Insufficient documentation

## 2021-12-25 MED ORDER — PREDNISONE 10 MG (21) PO TBPK: ORAL_TABLET | ORAL | 0 refills | Status: DC

## 2021-12-25 MED ORDER — DOXYCYCLINE MONOHYDRATE 100 MG PO TABS: 100.0000 mg | ORAL_TABLET | Freq: Two times a day (BID) | ORAL | 0 refills | Status: AC

## 2021-12-25 NOTE — ED Triage Notes (Signed)
Pt reports cough for weeks but today she started coughing stuff up. Pt states when she is congested she feels like she cannot breathe but when she coughs up the yellow stuff and clears she is better.  Denies fevers ?

## 2021-12-25 NOTE — ED Notes (Signed)
Pt seen ambulating out of department prior to discharge paperwork being given to her. No discharge VS obtained either.  ?

## 2021-12-25 NOTE — Discharge Instructions (Signed)
Take tapered steroid and on doxycycline as directed. ?

## 2021-12-25 NOTE — ED Provider Notes (Signed)
? ?[facility] ?Provider Note ? ?Patient Contact: 4:15 PM (approximate) ? ? ?History  ? ?Cough ? ? ?HPI ? ?Abigail Powers is a 81 y.o. female presents to the emergency department with productive cough for approximately 6 weeks.  Patient has a history of COPD.  She states that she hemograms church today and had difficulty producing coughing of her mucous plug.  She denies chest pain, chest tightness or shortness of breath.  She states that she does not have a follow-up appointment with her primary care provider until early April.  No fever or chills at home. ? ?  ? ? ?Physical Exam  ? ?Triage Vital Signs: ?ED Triage Vitals  ?Enc Vitals Group  ?   BP 12/25/21 1459 (!) 151/111  ?   Pulse Rate 12/25/21 1459 74  ?   Resp 12/25/21 1459 20  ?   Temp 12/25/21 1459 97.7 ?F (36.5 ?C)  ?   Temp Source 12/25/21 1459 Oral  ?   SpO2 12/25/21 1459 100 %  ?   Weight 12/25/21 1453 101 lb 6.6 oz (46 kg)  ?   Height 12/25/21 1453 '5\' 4"'$  (1.626 m)  ?   Head Circumference --   ?   Peak Flow --   ?   Pain Score 12/25/21 1453 0  ?   Pain Loc --   ?   Pain Edu? --   ?   Excl. in [place]? --   ? ? ?Most recent vital signs: ?Vitals:  ? 12/25/21 1459  ?BP: (!) 151/111  ?Pulse: 74  ?Resp: 20  ?Temp: 97.7 ?F (36.5 ?C)  ?SpO2: 100%  ? ? ? ?Constitutional: Alert and oriented. Patient is lying supine. ?Eyes: Conjunctivae are normal. PERRL. EOMI. ?Head: Atraumatic. ?ENT: ?     Ears: Tympanic membranes are mildly injected with mild effusion bilaterally.  ?     Nose: No congestion/rhinnorhea. ?     Mouth/Throat: Mucous membranes are moist. Posterior pharynx is mildly erythematous.  ?Hematological/Lymphatic/Immunilogical: No cervical lymphadenopathy.  ?Cardiovascular: Normal rate, regular rhythm. Normal S1 and S2.  Good peripheral circulation. ?Respiratory: Normal respiratory effort without tachypnea or retractions. Lungs CTAB. Good air entry to the bases with no decreased or absent breath sounds. ?Gastrointestinal: Bowel  sounds ?4 quadrants. Soft and nontender to palpation. No guarding or rigidity. No palpable masses. No distention. No CVA tenderness. ?Musculoskeletal: Full range of motion to all extremities. No gross deformities appreciated. ?Neurologic:  Normal speech and language. No gross focal neurologic deficits are appreciated.  ?Skin:  Skin is warm, dry and intact. No rash noted. ?Psychiatric: Mood and affect are normal. Speech and behavior are normal. Patient exhibits appropriate insight and judgement. ? ? ? ?ED Results / Procedures / Treatments  ? ?Labs ?(all labs ordered are listed, but only abnormal results are displayed) ?Labs Reviewed - No data to display ? ? ? ? ?RADIOLOGY ? ?I personally viewed and evaluated these images as part of my medical decision making, as well as reviewing the written report by the radiologist. ? ?ED Provider Interpretation: I personally reviewed chest x-ray and agree with radiologist interpretation.  No signs of pneumonia. ? ? ?PROCEDURES: ? ?Critical Care performed: No ? ?Procedures ? ? ?MEDICATIONS ORDERED IN ED: ?Medications - No data to display ? ? ?IMPRESSION / MDM / ASSESSMENT AND PLAN / ED COURSE  ?I reviewed the triage vital signs and the nursing notes. ?             ?               ?  Assessment and plan: ?Cough:  ?Differential diagnosis includes, but is not limited to, COPD exacerbation, community-acquired pneumonia, bronchitis... ? ?81 year old female presents to the emergency department with cough productive in nature for the past 3 weeks. ? ?Patient was hypertensive at triage and mildly tachypneic but vital signs otherwise reassuring.  Her O2 sats 100% on room air and patient had no increased work of breathing.  She had good breath sounds in the lung bases with no wheezing. ? ?We will treat with doxycycline and taper prednisone until patient can follow-up with her PCP return precautions were given to return with new or worsening symptoms. ?  ? ? ?FINAL CLINICAL IMPRESSION(S) / ED  DIAGNOSES  ? ?Final diagnoses:  ?COPD exacerbation ([place])  ? ? ? ?Rx / DC Orders  ? ?ED Discharge Orders   ? ?      Ordered  ?  doxycycline (ADOXA) 100 MG tablet  2 times daily       ? 12/25/21 1611  ?  predniSONE (STERAPRED UNI-PAK 21 TAB) 10 MG (21) TBPK tablet       ? 12/25/21 1611  ? ?  ?  ? ?  ? ? ? ?Note:  This document was prepared using Dragon voice recognition software and may include unintentional dictation errors. ?  ?Lannie Fields, PA-C ?12/25/21 1618 ? ?  ?Blake Divine, MD ?12/25/21 1912 ? ?

## 2022-05-18 ENCOUNTER — Encounter: Payer: Medicare Other | Admitting: Dermatology

## 2022-05-24 ENCOUNTER — Ambulatory Visit: Payer: Medicare Other | Admitting: Podiatry

## 2022-05-24 DIAGNOSIS — M2042 Other hammer toe(s) (acquired), left foot: Secondary | ICD-10-CM

## 2022-06-14 ENCOUNTER — Ambulatory Visit: Payer: Medicare Other | Admitting: Podiatry

## 2022-06-19 ENCOUNTER — Ambulatory Visit: Payer: Medicare Other

## 2022-06-19 ENCOUNTER — Ambulatory Visit: Payer: Medicare Other | Admitting: Podiatry

## 2022-06-19 ENCOUNTER — Encounter: Payer: Self-pay | Admitting: Podiatry

## 2022-06-19 DIAGNOSIS — M2042 Other hammer toe(s) (acquired), left foot: Secondary | ICD-10-CM

## 2022-06-19 DIAGNOSIS — M778 Other enthesopathies, not elsewhere classified: Secondary | ICD-10-CM

## 2022-06-19 MED ORDER — TRIAMCINOLONE ACETONIDE 40 MG/ML IJ SUSP
20.0000 mg | Freq: Once | INTRAMUSCULAR | Status: AC
  Administered 2022-06-19: 20 mg

## 2022-06-19 NOTE — Progress Notes (Signed)
She presents today complaining of pain of the first metatarsophalangeal joint and second toe and left foot.  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  Mild hallux valgus is noted.  Assessment: Capsulitis first metatarsophalangeal joint.  Plan: I injected the first metatarsophalangeal joint today with dexamethasone local anesthetic tolerated procedure well without complications follow-up with me on an as-needed basis.

## 2022-07-25 ENCOUNTER — Other Ambulatory Visit: Payer: Self-pay | Admitting: Internal Medicine

## 2022-07-25 DIAGNOSIS — Z1231 Encounter for screening mammogram for malignant neoplasm of breast: Secondary | ICD-10-CM

## 2022-08-07 ENCOUNTER — Other Ambulatory Visit: Payer: Self-pay | Admitting: Family Medicine

## 2022-08-07 DIAGNOSIS — J188 Other pneumonia, unspecified organism: Secondary | ICD-10-CM

## 2022-08-23 ENCOUNTER — Ambulatory Visit
Admission: RE | Admit: 2022-08-23 | Discharge: 2022-08-23 | Disposition: A | Discharge status: Home / Self Care | Payer: Medicare Other | Source: Ambulatory Visit | Attending: Family Medicine | Admitting: Family Medicine

## 2022-08-23 DIAGNOSIS — J188 Other pneumonia, unspecified organism: Secondary | ICD-10-CM

## 2022-08-29 ENCOUNTER — Ambulatory Visit: Payer: Medicare Other | Admitting: Dermatology

## 2022-08-29 ENCOUNTER — Encounter: Payer: Self-pay | Admitting: Dermatology

## 2022-08-29 VITALS — BP 137/71 | HR 66

## 2022-08-29 DIAGNOSIS — Z1283 Encounter for screening for malignant neoplasm of skin: Secondary | ICD-10-CM | POA: Diagnosis not present

## 2022-08-29 DIAGNOSIS — L814 Other melanin hyperpigmentation: Secondary | ICD-10-CM | POA: Diagnosis not present

## 2022-08-29 DIAGNOSIS — L570 Actinic keratosis: Secondary | ICD-10-CM | POA: Diagnosis not present

## 2022-08-29 DIAGNOSIS — Z85828 Personal history of other malignant neoplasm of skin: Secondary | ICD-10-CM | POA: Diagnosis not present

## 2022-08-29 DIAGNOSIS — L821 Other seborrheic keratosis: Secondary | ICD-10-CM

## 2022-08-29 DIAGNOSIS — C4491 Basal cell carcinoma of skin, unspecified: Secondary | ICD-10-CM

## 2022-08-29 DIAGNOSIS — L578 Other skin changes due to chronic exposure to nonionizing radiation: Secondary | ICD-10-CM

## 2022-08-29 DIAGNOSIS — C44519 Basal cell carcinoma of skin of other part of trunk: Secondary | ICD-10-CM | POA: Diagnosis not present

## 2022-08-29 DIAGNOSIS — D492 Neoplasm of unspecified behavior of bone, soft tissue, and skin: Secondary | ICD-10-CM

## 2022-08-29 HISTORY — DX: Basal cell carcinoma of skin, unspecified: C44.91

## 2022-08-29 NOTE — Progress Notes (Unsigned)
Follow-Up Visit   Subjective  Abigail Powers is a 81 y.o. female who presents for the following: Annual Exam (Hx of AK's, Hx SCC. Pink scaly areas on forehead and left cheek).  The patient presents for Total-Body Skin Exam (TBSE) for skin cancer screening and mole check.  The patient has spots, moles and lesions to be evaluated, some may be new or changing and the patient has concerns that these could be cancer.   The following portions of the chart were reviewed this encounter and updated as appropriate:  Tobacco  Allergies  Meds  Problems  Med Hx  Surg Hx  Fam Hx      Review of Systems: No other skin or systemic complaints except as noted in HPI or Assessment and Plan.   Objective  Well appearing patient in no apparent distress; mood and affect are within normal limits.  A full examination was performed including scalp, head, eyes, ears, nose, lips, neck, chest, axillae, abdomen, back, buttocks, bilateral upper extremities, bilateral lower extremities, hands, feet, fingers, toes, fingernails, and toenails. All findings within normal limits unless otherwise noted below.  right forehead x1, left cheek x1, left nasal sidewall x1, low mid chest x1 (4) Erythematous thin papules/macules with gritty scale.   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 - No evidence of recurrence today - No lymphadenopathy - Recommend regular full body skin exams - Recommend daily broad spectrum sunscreen SPF 30+ to sun-exposed areas, reapply every 2 hours as needed.  - Call if any new or changing lesions are noted between office visits  Lentigines - Scattered tan macules - Due to sun exposure - Benign-appearing, observe - Recommend daily broad spectrum sunscreen SPF 30+ to sun-exposed areas, reapply  every 2 hours as needed. - Call for any changes  Seborrheic Keratoses - Stuck-on, waxy, tan-brown papules and/or plaques  - Benign-appearing - Discussed benign etiology and prognosis. - Observe - Call for any changes  Melanocytic Nevi - Tan-brown and/or pink-flesh-colored symmetric macules and papules - Benign appearing on exam today - Observation - Call clinic for new or changing moles - Recommend daily use of broad spectrum spf 30+ sunscreen to sun-exposed areas.   Hemangiomas - Red papules - Discussed benign nature - Observe - Call for any changes  Actinic Damage - Chronic condition, secondary to cumulative UV/sun exposure - diffuse scaly erythematous macules with underlying dyspigmentation - Recommend daily broad spectrum sunscreen SPF 30+ to sun-exposed areas, reapply every 2 hours as needed.  - Staying in the shade or wearing long sleeves, sun glasses (UVA+UVB protection) and wide brim hats (4-inch brim around the entire circumference of the hat) are also recommended for sun protection.  - Call for new or changing lesions.  Skin cancer screening performed today.  AK (actinic keratosis) (4) right forehead x1, left cheek x1, left nasal sidewall x1, low mid chest x1  Actinic keratoses are precancerous spots that appear secondary to cumulative UV radiation exposure/sun exposure over time. They are chronic with expected duration over 1 year. A portion of actinic keratoses will progress to squamous cell carcinoma of the skin. It is not possible to reliably predict which spots will progress to skin cancer and so treatment is recommended to prevent development of skin cancer.  Recommend daily broad spectrum sunscreen SPF  30+ to sun-exposed areas, reapply every 2 hours as needed.  Recommend staying in the shade or wearing long sleeves, sun glasses (UVA+UVB protection) and wide brim hats (4-inch brim around the entire circumference of the hat). Call for new or changing  lesions.  Destruction of lesion - right forehead x1, left cheek x1, left nasal sidewall x1, low mid chest x1  Destruction method: cryotherapy   Informed consent: discussed and consent obtained   Lesion destroyed using liquid nitrogen: Yes   Region frozen until ice ball extended beyond lesion: Yes   Outcome: patient tolerated procedure well with no complications   Post-procedure details: wound care instructions given   Additional details:  Prior to procedure, discussed risks of blister formation, small wound, skin dyspigmentation, or rare scar following cryotherapy. Recommend Vaseline ointment to treated areas while healing.   Neoplasm of skin (2) mid upper chest  Skin / nail biopsy Type of biopsy: tangential   Informed consent: discussed and consent obtained   Anesthesia: the lesion was anesthetized in a standard fashion   Anesthesia comment:  Area prepped with alcohol Anesthetic:  1% lidocaine w/ epinephrine 1-100,000 buffered w/ 8.4% NaHCO3 Instrument used: flexible razor blade   Hemostasis achieved with: pressure, aluminum chloride and electrodesiccation   Outcome: patient tolerated procedure well   Post-procedure details: wound care instructions given   Post-procedure details comment:  Ointment and small bandage applied  Specimen 1 - Surgical pathology Differential Diagnosis: R/O BCC  Check Margins: No  Left Elbow - Posterior  Left elbow  R/O cyst vs lipoma vs other. RTC for surgery after first of 2024.   Return for TBSE 6 months, Surgery 2 months for NUB on elbow.  I, Emelia Salisbury, CMA, am acting as scribe for Forest Gleason, MD.  Documentation: I have reviewed the above documentation for accuracy and completeness, and I agree with the above.  Forest Gleason, MD

## 2022-08-29 NOTE — Patient Instructions (Addendum)
Cryotherapy Aftercare  Wash gently with soap and water everyday.   Apply Vaseline daily until healed.    Wound Care Instructions  Cleanse wound gently with soap and water once a day then pat dry with clean gauze. Apply a thin coat of Petrolatum (petroleum jelly, "Vaseline") over the wound (unless you have an allergy to this). We recommend that you use a new, sterile tube of Vaseline. Do not pick or remove scabs. Do not remove the yellow or white "healing tissue" from the base of the wound.  Cover the wound with fresh, clean, nonstick gauze and secure with paper tape. You may use Band-Aids in place of gauze and tape if the wound is small enough, but would recommend trimming much of the tape off as there is often too much. Sometimes Band-Aids can irritate the skin.  You should call the office for your biopsy report after 1 week if you have not already been contacted.  If you experience any problems, such as abnormal amounts of bleeding, swelling, significant bruising, significant pain, or evidence of infection, please call the office immediately.  FOR ADULT SURGERY PATIENTS: If you need something for pain relief you may take 1 extra strength Tylenol (acetaminophen) AND 2 Ibuprofen ('200mg'$  each) together every 4 hours as needed for pain. (do not take these if you are allergic to them or if you have a reason you should not take them.) Typically, you may only need pain medication for 1 to 3 days.      Recommend daily broad spectrum sunscreen SPF 30+ to sun-exposed areas, reapply every 2 hours as needed. Call for new or changing lesions.  Staying in the shade or wearing long sleeves, sun glasses (UVA+UVB protection) and wide brim hats (4-inch brim around the entire circumference of the hat) are also recommended for sun protection.    Melanoma ABCDEs  Melanoma is the most dangerous type of skin cancer, and is the leading cause of death from skin disease.  You are more likely to develop melanoma if  you: Have light-colored skin, light-colored eyes, or red or blond hair Spend a lot of time in the sun Tan regularly, either outdoors or in a tanning bed Have had blistering sunburns, especially during childhood Have a close family member who has had a melanoma Have atypical moles or large birthmarks  Early detection of melanoma is key since treatment is typically straightforward and cure rates are extremely high if we catch it early.   The first sign of melanoma is often a change in a mole or a new dark spot.  The ABCDE system is a way of remembering the signs of melanoma.  A for asymmetry:  The two halves do not match. B for border:  The edges of the growth are irregular. C for color:  A mixture of colors are present instead of an even brown color. D for diameter:  Melanomas are usually (but not always) greater than 28m - the size of a pencil eraser. E for evolution:  The spot keeps changing in size, shape, and color.  Please check your skin once per month between visits. You can use a small mirror in front and a large mirror behind you to keep an eye on the back side or your body.   If you see any new or changing lesions before your next follow-up, please call to schedule a visit.  Please continue daily skin protection including broad spectrum sunscreen SPF 30+ to sun-exposed areas, reapplying every 2 hours as  needed when you're outdoors.   Staying in the shade or wearing long sleeves, sun glasses (UVA+UVB protection) and wide brim hats (4-inch brim around the entire circumference of the hat) are also recommended for sun protection.       Pre-Operative Instructions  You are scheduled for a surgical procedure at [facility]. We recommend you read the following instructions. If you have any questions or concerns, please call the office at [phone].  Shower and wash the entire body with soap and water the day of your surgery paying special attention to cleansing at and  around the planned surgery site.  Avoid aspirin or aspirin containing products at least fourteen (14) days prior to your surgical procedure and for at least one week (7 Days) after your surgical procedure. If you take aspirin on a regular basis for heart disease or history of stroke or for any other reason, we may recommend you continue taking aspirin but please notify us if you take this on a regular basis. Aspirin can cause more bleeding to occur during surgery as well as prolonged bleeding and bruising after surgery.   Avoid other nonsteroidal pain medications at least one week prior to surgery and at least one week prior to your surgery. These include medications such as Ibuprofen (Motrin, Advil and Nuprin), Naprosyn, Voltaren, Relafen, etc. If medications are used for therapeutic reasons, please inform us as they can cause increased bleeding or prolonged bleeding during and bruising after surgical procedures.   Please advise [place] if you are taking any "blood thinner" medications such as Coumadin or Dipyridamole or Plavix or similar medications. These cause increased bleeding and prolonged bleeding during procedures and bruising after surgical procedures. We may have to consider discontinuing these medications briefly prior to and shortly after your surgery if safe to do so.   Please inform us of all medications you are currently taking. All medications that are taken regularly should be taken the day of surgery as you always do. Nevertheless, we need to be informed of what medications you are taking prior to surgery to know whether they will affect the procedure or cause any complications.   Please inform us of any medication allergies. Also inform us of whether you have allergies to Latex or rubber products or whether you have had any adverse reaction to Lidocaine or Epinephrine.  Please inform us of any prosthetic or artificial body parts such as artificial heart valve, joint replacements, etc., or  similar condition that might require preoperative antibiotics.   We recommend avoidance of alcohol at least two weeks prior to surgery and continued avoidance for at least two weeks after surgery.   We recommend discontinuation of tobacco smoking at least two weeks prior to surgery and continued abstinence for at least two weeks after surgery.  Do not plan strenuous exercise, strenuous work or strenuous lifting for approximately four weeks after your surgery.   We request if you are unable to make your scheduled surgical appointment, please call us at least a week in advance or as soon as you are aware of a problem so that we can cancel or reschedule the appointment.   You MAY TAKE TYLENOL (acetaminophen) for pain as it is not a blood thinner.   PLEASE PLAN TO BE IN TOWN FOR TWO WEEKS FOLLOWING SURGERY, THIS IS IMPORTANT SO YOU CAN BE CHECKED FOR DRESSING CHANGES, SUTURE REMOVAL AND TO MONITOR FOR POSSIBLE COMPLICATIONS.    Due to recent changes in healthcare laws, you may see  results of your pathology and/or laboratory studies on MyChart before the doctors have had a chance to review them. We understand that in some cases there may be results that are confusing or concerning to you. Please understand that not all results are received at the same time and often the doctors may need to interpret multiple results in order to provide you with the best plan of care or course of treatment. Therefore, we ask that you please give [place] 2 business days to thoroughly review all your results before contacting the office for clarification. Should we see a critical lab result, you will be contacted sooner.   If You Need Anything After Your Visit  If you have any questions or concerns for your doctor, please call our main line at [phone] and press option 4 to reach your doctor's medical assistant. If no one answers, please leave a voicemail as directed and we will return your call as soon as possible.  Messages left after 4 pm will be answered the following business day.   You may also send [place] a message via [place]. We typically respond to MyChart messages within 1-2 business days.  For prescription refills, please ask your pharmacy to contact our office. Our fax number is [phone].  If you have an urgent issue when the clinic is closed that cannot wait until the next business day, you can page your doctor at the number below.    Please note that while we do our best to be available for urgent issues outside of office hours, we are not available 24/7.   If you have an urgent issue and are unable to reach [place], you may choose to seek medical care at your doctor's office, retail clinic, urgent care center, or emergency room.  If you have a medical emergency, please immediately call 911 or go to the emergency department.  Pager Numbers  - Dr. Nehemiah Massed: [phone]  - Dr. Laurence Ferrari: [phone]  - Dr. Nicole Kindred: [phone]  In the event of inclement weather, please call our main line at [phone] for an update on the status of any delays or closures.  Dermatology Medication Tips: Please keep the boxes that topical medications come in in order to help keep track of the instructions about where and how to use these. Pharmacies typically print the medication instructions only on the boxes and not directly on the medication tubes.   If your medication is too expensive, please contact our office at [phone] option 4 or send [place] a message through [place].   We are unable to tell what your co-pay for medications will be in advance as this is different depending on your insurance coverage. However, we may be able to find a substitute medication at lower cost or fill out paperwork to get insurance to cover a needed medication.   If a prior authorization is required to get your medication covered by your insurance company, please allow [place] 1-2 business days to complete this process.  Drug  prices often vary depending on where the prescription is filled and some pharmacies may offer cheaper prices.  The website www.goodrx.com contains coupons for medications through different pharmacies. The prices here do not account for what the cost may be with help from insurance (it may be cheaper with your insurance), but the website can give you the price if you did not use any insurance.  - You can print the associated coupon and take it with your prescription to the pharmacy.  - You may  also stop by our office during regular business hours and pick up a GoodRx coupon card.  - If you need your prescription sent electronically to a different pharmacy, notify our office through [facility] or by phone at [phone] option 4.     Si Usted Necesita Algo Despus de Su Visita  Tambin puede enviarnos un mensaje a travs de Pharmacist, community. Por lo general respondemos a los mensajes de MyChart en el transcurso de 1 a 2 das hbiles.  Para renovar recetas, por favor pida a su farmacia que se ponga en contacto con nuestra oficina. Harland Dingwall de fax es [place] [phone].  Si tiene un asunto urgente cuando la clnica est cerrada y que no puede esperar hasta el siguiente da hbil, puede llamar/localizar a su doctor(a) al nmero que aparece a continuacin.   Por favor, tenga en cuenta que aunque hacemos todo lo posible para estar disponibles para asuntos urgentes fuera del horario de [place], no estamos disponibles las 24 horas del da, los 7 das de la [place].   Si tiene un problema urgente y no puede comunicarse con nosotros, puede optar por buscar atencin mdica  en el consultorio de su doctor(a), en una clnica privada, en un centro de atencin urgente o en una sala de emergencias.  Si tiene Engineering geologist, por favor llame inmediatamente al 911 o vaya a la sala de emergencias.  Nmeros de bper  - Dr. Nehemiah Massed: [phone]  - Dra. Moye: [phone]  - Dra. Nicole Kindred:  [phone]  En caso de inclemencias del [place], por favor llame a Johnsie Kindred principal al [phone] para una actualizacin sobre el [place] de cualquier retraso o cierre.  Consejos para la medicacin en dermatologa: Por favor, guarde las cajas en las que vienen los medicamentos de uso tpico para ayudarle a seguir las instrucciones sobre dnde y cmo usarlos. Las farmacias generalmente imprimen las instrucciones del medicamento slo en las cajas y no directamente en los tubos del [place].   Si su medicamento es muy caro, por favor, pngase en contacto con Zigmund Daniel llamando al [phone] y presione la opcin 4 o envenos un mensaje a travs de Pharmacist, community.   No podemos decirle cul ser su copago por los medicamentos por adelantado ya que esto es diferente dependiendo de la cobertura de su seguro. Sin embargo, es posible que podamos encontrar un medicamento sustituto a Electrical engineer un formulario para que el seguro cubra el medicamento que se considera necesario.   Si se requiere una autorizacin previa para que su compaa de seguros [place] su medicamento, por favor permtanos de 1 a 2 das hbiles para completar este proceso.  Los precios de los medicamentos varan con frecuencia dependiendo del Environmental consultant de dnde se surte la receta y alguna farmacias pueden ofrecer precios ms baratos.  El sitio web www.goodrx.com tiene cupones para medicamentos de Airline pilot. Los precios aqu no tienen en cuenta lo que podra costar con la ayuda del seguro (puede ser ms barato con su seguro), pero el sitio web puede darle el precio si no utiliz Research scientist (physical sciences).  - Puede imprimir el cupn correspondiente y llevarlo con su receta a la farmacia.  - Tambin puede pasar por nuestra oficina durante el horario de atencin regular y Charity fundraiser una tarjeta de cupones de GoodRx.  - Si necesita que su receta se enve electrnicamente a Chiropodist, informe a nuestra oficina a travs de  MyChart de [place] o por telfono llamando al [phone] y presione la  opcin 4.

## 2022-08-30 ENCOUNTER — Encounter: Payer: Self-pay | Admitting: Dermatology

## 2022-09-03 ENCOUNTER — Emergency Department: Payer: Medicare Other

## 2022-09-03 ENCOUNTER — Other Ambulatory Visit: Payer: Self-pay

## 2022-09-03 ENCOUNTER — Emergency Department
Admission: EM | Admit: 2022-09-03 | Discharge: 2022-09-03 | Disposition: A | Discharge status: Home / Self Care | Payer: Medicare Other | Attending: Emergency Medicine | Admitting: Emergency Medicine

## 2022-09-03 DIAGNOSIS — I700 Atherosclerosis of aorta: Secondary | ICD-10-CM | POA: Diagnosis not present

## 2022-09-03 DIAGNOSIS — M25551 Pain in right hip: Secondary | ICD-10-CM | POA: Diagnosis not present

## 2022-09-03 DIAGNOSIS — M48061 Spinal stenosis, lumbar region without neurogenic claudication: Secondary | ICD-10-CM | POA: Insufficient documentation

## 2022-09-03 DIAGNOSIS — M549 Dorsalgia, unspecified: Secondary | ICD-10-CM | POA: Diagnosis present

## 2022-09-03 DIAGNOSIS — M5136 Other intervertebral disc degeneration, lumbar region: Secondary | ICD-10-CM

## 2022-09-03 DIAGNOSIS — M5126 Other intervertebral disc displacement, lumbar region: Secondary | ICD-10-CM | POA: Diagnosis not present

## 2022-09-03 MED ORDER — ACETAMINOPHEN 325 MG PO TABS
650.0000 mg | ORAL_TABLET | Freq: Once | ORAL | Status: AC
  Administered 2022-09-03: 650 mg via ORAL
  Filled 2022-09-03: qty 2

## 2022-09-03 MED ORDER — ONDANSETRON 4 MG PO TBDP
4.0000 mg | ORAL_TABLET | Freq: Once | ORAL | Status: AC
  Administered 2022-09-03: 4 mg via ORAL
  Filled 2022-09-03: qty 1

## 2022-09-03 MED ORDER — OXYCODONE HCL 5 MG PO TABS: 2.5000 mg | ORAL_TABLET | Freq: Four times a day (QID) | ORAL | 0 refills | Status: DC | PRN

## 2022-09-03 MED ORDER — OXYCODONE HCL 5 MG PO TABS
2.5000 mg | ORAL_TABLET | Freq: Once | ORAL | Status: AC
  Administered 2022-09-03: 2.5 mg via ORAL
  Filled 2022-09-03: qty 1

## 2022-09-03 MED ORDER — KETOROLAC TROMETHAMINE 60 MG/2ML IM SOLN
30.0000 mg | Freq: Once | INTRAMUSCULAR | Status: AC
  Administered 2022-09-03: 30 mg via INTRAMUSCULAR
  Filled 2022-09-03: qty 2

## 2022-09-03 MED ORDER — METHYLPREDNISOLONE 4 MG PO TBPK: ORAL_TABLET | ORAL | 0 refills | Status: DC

## 2022-09-03 NOTE — Discharge Instructions (Signed)
For your pain:  Take over-the-counter Alleve (Naproxen) or Motrin (Ibuprofen) daily for 5-7 days Take over-the-counter Tylenol (Acetaminophen) 1000 mg every 8 hours for 2-3 days then as needed  Take the Oxycodone, one half to one tablet, every 4-5 hours for severe pain  Take the steroids as prescribed  Follow-up with Dr. Sabra Heck in 1 week. If your symptoms are not resolved, you may benefit from seeing a Spine Sepcialist.

## 2022-09-03 NOTE — ED Notes (Signed)
Pt was here for right hip pain. Pt denies injury or fall. Pain level when she came in was significantly higher than when she left ED. Pt ambulatory with a steady gait. Vitals stable prior to d/c. Alert and oriented x4. Resp. Rate even and nonlabored. Skin pink warm and dry.

## 2022-09-03 NOTE — ED Provider Notes (Signed)
[facility] Provider Note    Event Date/Time   First MD Initiated Contact with Patient 09/03/22 [phone]     (approximate)   History   Hip Pain   HPI  Abigail Powers is a 81 y.o. female  here with back pain, R hip pain. Pt reports that for the last 2-3 days she has had aching, throbbing R hip pain. No trauma. She had a cold/cough about a month ago and was on prednisone and levaquin, felt better but now back hurts. Pain is worse w/ movement and palpation. No alleviating factors. No LE weakness or numbness. No loss of bowel or bladder function. No abdominal pain or pelvic pain.       Physical Exam   Triage Vital Signs: ED Triage Vitals  Enc Vitals Group     BP 09/03/22 0838 (!) 130/59     Pulse Rate 09/03/22 0838 74     Resp 09/03/22 0838 18     Temp 09/03/22 0838 97.7 F (36.5 C)     Temp Source 09/03/22 0838 Oral     SpO2 09/03/22 0838 98 %     Weight 09/03/22 0840 101 lb 6.6 oz (46 kg)     Height 09/03/22 0840 '5\' 4"'$  (1.626 m)     Head Circumference --      Peak Flow --      Pain Score 09/03/22 0839 5     Pain Loc --      Pain Edu? --      Excl. in [place]? --     Most recent vital signs: Vitals:   09/03/22 0838 09/03/22 1405  BP: (!) 130/59 (!) 153/56  Pulse: 74 72  Resp: 18 16  Temp: 97.7 F (36.5 C)   SpO2: 98% 96%     General: Awake, no distress.  CV:  Good peripheral perfusion. RRR. No murmurs. Resp:  Normal effort. Lungs CTAB. Abd:  No distention. No tenderness. Other:  TTP over R SI joint and paraspinal lumbar spine. No deformity, step offs. Strength 5/5 bilateral LE. Normal sensation to light touch   ED Results / Procedures / Treatments   Labs (all labs ordered are listed, but only abnormal results are displayed) Labs Reviewed - No data to display   EKG    RADIOLOGY XR Lumbar: Negative CT Lumbar: significant disc disease L4-5 with mod to severe canal stenosis, diffuse degen disease CT Pelvis: No hip fx   I  also independently reviewed and agree with radiologist interpretations.   PROCEDURES:  Critical Care performed: No   MEDICATIONS ORDERED IN ED: Medications  oxyCODONE (Oxy IR/ROXICODONE) immediate release tablet 2.5 mg (2.5 mg Oral Given 09/03/22 1226)  acetaminophen (TYLENOL) tablet 650 mg (650 mg Oral Given 09/03/22 1226)  ketorolac (TORADOL) injection 30 mg (30 mg Intramuscular Given 09/03/22 1226)  ondansetron (ZOFRAN-ODT) disintegrating tablet 4 mg (4 mg Oral Given 09/03/22 1226)     IMPRESSION / MDM / ASSESSMENT AND PLAN / ED COURSE  I reviewed the triage vital signs and the nursing notes.                              Differential diagnosis includes, but is not limited to, fall with hip fx, pelvic/SI fx, lumbar radiculopathy, other SI joint arthritis  Patient's presentation is most consistent with acute presentation with potential threat to life or bodily function.  81 yo F here with atraumatic R hip/SI pain.  Exam is most c/w lumbar radiculopathy versus sacroileitis.Plain films negative so CT obtained which show marked degenerative disease, particularly at the area of pain. Suspect acute on chronic radicular pain/pain related to DDD in setting of minor injury or strain from recent URI/coughing. Pt has no LE weakness, numbness, or signs of cauda equina. D/c home with analgesia, return precautions.  FINAL CLINICAL IMPRESSION(S) / ED DIAGNOSES   Final diagnoses:  Degenerative disc disease, lumbar     Rx / DC Orders   ED Discharge Orders          Ordered    methylPREDNISolone (MEDROL DOSEPAK) 4 MG TBPK tablet        09/03/22 1336    oxyCODONE (ROXICODONE) 5 MG immediate release tablet  Every 6 hours PRN        09/03/22 1336             Note:  This document was prepared using Dragon voice recognition software and may include unintentional dictation errors.   Duffy Bruce, MD 09/03/22 [phone]

## 2022-09-03 NOTE — ED Triage Notes (Signed)
Pt reports pain to her right hip for the last few days. Pt denies falls or injuries. Pt reports hx of sciatica but states this is worse.

## 2022-09-05 ENCOUNTER — Telehealth: Payer: Self-pay

## 2022-09-05 NOTE — Telephone Encounter (Signed)
N/A. LMOVM for her to return my call.

## 2022-09-05 NOTE — Telephone Encounter (Signed)
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 Thank you!

## 2022-09-05 NOTE — Telephone Encounter (Signed)
Patient advised of BX results and scheduled for EDC. aw

## 2022-09-18 ENCOUNTER — Ambulatory Visit: Payer: Medicare Other

## 2022-10-12 ENCOUNTER — Other Ambulatory Visit: Payer: Self-pay | Admitting: Pulmonary Disease

## 2022-10-12 DIAGNOSIS — R918 Other nonspecific abnormal finding of lung field: Secondary | ICD-10-CM

## 2022-10-17 ENCOUNTER — Ambulatory Visit
Admission: RE | Admit: 2022-10-17 | Discharge: 2022-10-17 | Disposition: A | Discharge status: Home / Self Care | Payer: Medicare Other | Source: Ambulatory Visit | Attending: Pulmonary Disease | Admitting: Pulmonary Disease

## 2022-10-17 DIAGNOSIS — R918 Other nonspecific abnormal finding of lung field: Secondary | ICD-10-CM

## 2022-10-17 MED ORDER — IOPAMIDOL (ISOVUE-300) INJECTION 61%
75.0000 mL | Freq: Once | INTRAVENOUS | Status: AC | PRN
  Administered 2022-10-17: 75 mL via INTRAVENOUS

## 2022-10-24 ENCOUNTER — Other Ambulatory Visit: Payer: Self-pay | Admitting: Internal Medicine

## 2022-10-24 DIAGNOSIS — M5416 Radiculopathy, lumbar region: Secondary | ICD-10-CM

## 2022-11-03 ENCOUNTER — Ambulatory Visit
Admission: RE | Admit: 2022-11-03 | Discharge: 2022-11-03 | Disposition: A | Discharge status: Home / Self Care | Payer: Medicare Other | Source: Ambulatory Visit | Attending: Internal Medicine | Admitting: Internal Medicine

## 2022-11-03 DIAGNOSIS — M5416 Radiculopathy, lumbar region: Secondary | ICD-10-CM

## 2022-11-08 ENCOUNTER — Encounter: Payer: Medicare Other | Admitting: Dermatology

## 2022-11-16 ENCOUNTER — Encounter: Payer: Self-pay | Admitting: Dermatology

## 2022-11-16 ENCOUNTER — Ambulatory Visit: Payer: Medicare Other | Admitting: Dermatology

## 2022-11-16 VITALS — BP 142/77 | HR 66

## 2022-11-16 DIAGNOSIS — L819 Disorder of pigmentation, unspecified: Secondary | ICD-10-CM

## 2022-11-16 DIAGNOSIS — C44519 Basal cell carcinoma of skin of other part of trunk: Secondary | ICD-10-CM

## 2022-11-16 NOTE — Patient Instructions (Signed)
Electrodesiccation and Curettage ("Scrape and Burn") Wound Care Instructions  Leave the original bandage on for 24 hours if possible.  If the bandage becomes soaked or soiled before that time, it is OK to remove it and examine the wound.  A small amount of post-operative bleeding is normal.  If excessive bleeding occurs, remove the bandage, place gauze over the site and apply continuous pressure (no peeking) over the area for 30 minutes. If this does not work, please call our clinic as soon as possible or page your doctor if it is after hours.   Once a day, cleanse the wound with soap and water. It is fine to shower. If a thick crust develops you may use a Q-tip dipped into dilute hydrogen peroxide (mix 1:1 with water) to dissolve it.  Hydrogen peroxide can slow the healing process, so use it only as needed.    After washing, apply petroleum jelly (Vaseline) or an antibiotic ointment if your doctor prescribed one for you, followed by a bandage.    For best healing, the wound should be covered with a layer of ointment at all times. If you are not able to keep the area covered with a bandage to hold the ointment in place, this may mean re-applying the ointment several times a day.  Continue this wound care until the wound has healed and is no longer open. It may take several weeks for the wound to heal and close.  Itching and mild discomfort is normal during the healing process.  If you have any discomfort, you can take Tylenol (acetaminophen) or ibuprofen as directed on the bottle. (Please do not take these if you have an allergy to them or cannot take them for another reason).  Some redness, tenderness and white or yellow material in the wound is normal healing.  If the area becomes very sore and red, or develops a thick yellow-green material (pus), it may be infected; please notify us.    Wound healing continues for up to one year following surgery. It is not unusual to experience pain in the scar  from time to time during the interval.  If the pain becomes severe or the scar thickens, you should notify the office.    A slight amount of redness in a scar is expected for the first six months.  After six months, the redness will fade and the scar will soften and fade.  The color difference becomes less noticeable with time.  If there are any problems, return for a post-op surgery check at your earliest convenience.  To improve the appearance of the scar, you can use silicone scar gel, cream, or sheets (such as Mederma or Serica) every night for up to one year. These are available over the counter (without a prescription).  Please call our office at [phone] for any questions or concerns.     Due to recent changes in healthcare laws, you may see results of your pathology and/or laboratory studies on MyChart before the doctors have had a chance to review them. We understand that in some cases there may be results that are confusing or concerning to you. Please understand that not all results are received at the same time and often the doctors may need to interpret multiple results in order to provide you with the best plan of care or course of treatment. Therefore, we ask that you please give us 2 business days to thoroughly review all your results before contacting the office for clarification. Should   we see a critical lab result, you will be contacted sooner.   If You Need Anything After Your Visit  If you have any questions or concerns for your doctor, please call our main line at [phone] and press option 4 to reach your doctor's medical assistant. If no one answers, please leave a voicemail as directed and we will return your call as soon as possible. Messages left after 4 pm will be answered the following business day.   You may also send us a message via MyChart. We typically respond to MyChart messages within 1-2 business days.  For prescription refills, please ask your  pharmacy to contact our office. Our fax number is [phone].  If you have an urgent issue when the clinic is closed that cannot wait until the next business day, you can page your doctor at the number below.    Please note that while we do our best to be available for urgent issues outside of office hours, we are not available 24/7.   If you have an urgent issue and are unable to reach us, you may choose to seek medical care at your doctor's office, retail clinic, urgent care center, or emergency room.  If you have a medical emergency, please immediately call 911 or go to the emergency department.  Pager Numbers  - Dr. Kowalski: [phone]  - Dr. Moye: [phone]  - Dr. Stewart: [phone]  In the event of inclement weather, please call our main line at [phone] for an update on the status of any delays or closures.  Dermatology Medication Tips: Please keep the boxes that topical medications come in in order to help keep track of the instructions about where and how to use these. Pharmacies typically print the medication instructions only on the boxes and not directly on the medication tubes.   If your medication is too expensive, please contact our office at [phone] option 4 or send us a message through MyChart.   We are unable to tell what your co-pay for medications will be in advance as this is different depending on your insurance coverage. However, we may be able to find a substitute medication at lower cost or fill out paperwork to get insurance to cover a needed medication.   If a prior authorization is required to get your medication covered by your insurance company, please allow us 1-2 business days to complete this process.  Drug prices often vary depending on where the prescription is filled and some pharmacies may offer cheaper prices.  The website www.goodrx.com contains coupons for medications through different pharmacies. The prices here do not  account for what the cost may be with help from insurance (it may be cheaper with your insurance), but the website can give you the price if you did not use any insurance.  - You can print the associated coupon and take it with your prescription to the pharmacy.  - You may also stop by our office during regular business hours and pick up a GoodRx coupon card.  - If you need your prescription sent electronically to a different pharmacy, notify our office through [facility] MyChart or by phone at [phone] option 4.     Si Usted Necesita Algo Despus de Su Visita  Tambin puede enviarnos un mensaje a travs de MyChart. Por lo general respondemos a los mensajes de MyChart en el transcurso de 1 a 2 das hbiles.  Para renovar recetas, por favor pida a su farmacia que se ponga en   contacto con nuestra oficina. Nuestro nmero de fax es el [phone].  Si tiene un asunto urgente cuando la clnica est cerrada y que no puede esperar hasta el siguiente da hbil, puede llamar/localizar a su doctor(a) al nmero que aparece a continuacin.   Por favor, tenga en cuenta que aunque hacemos todo lo posible para estar disponibles para asuntos urgentes fuera del horario de oficina, no estamos disponibles las 24 horas del da, los 7 das de la semana.   Si tiene un problema urgente y no puede comunicarse con nosotros, puede optar por buscar atencin mdica  en el consultorio de su doctor(a), en una clnica privada, en un centro de atencin urgente o en una sala de emergencias.  Si tiene una emergencia mdica, por favor llame inmediatamente al 911 o vaya a la sala de emergencias.  Nmeros de bper  - Dr. Kowalski: [phone]  - Dra. Moye: [phone]  - Dra. Stewart: [phone]  En caso de inclemencias del tiempo, por favor llame a nuestra lnea principal al [phone] para una actualizacin sobre el estado de cualquier retraso o cierre.  Consejos para la medicacin en dermatologa: Por  favor, guarde las cajas en las que vienen los medicamentos de uso tpico para ayudarle a seguir las instrucciones sobre dnde y cmo usarlos. Las farmacias generalmente imprimen las instrucciones del medicamento slo en las cajas y no directamente en los tubos del medicamento.   Si su medicamento es muy caro, por favor, pngase en contacto con nuestra oficina llamando al [phone] y presione la opcin 4 o envenos un mensaje a travs de MyChart.   No podemos decirle cul ser su copago por los medicamentos por adelantado ya que esto es diferente dependiendo de la cobertura de su seguro. Sin embargo, es posible que podamos encontrar un medicamento sustituto a menor costo o llenar un formulario para que el seguro cubra el medicamento que se considera necesario.   Si se requiere una autorizacin previa para que su compaa de seguros cubra su medicamento, por favor permtanos de 1 a 2 das hbiles para completar este proceso.  Los precios de los medicamentos varan con frecuencia dependiendo del lugar de dnde se surte la receta y alguna farmacias pueden ofrecer precios ms baratos.  El sitio web www.goodrx.com tiene cupones para medicamentos de diferentes farmacias. Los precios aqu no tienen en cuenta lo que podra costar con la ayuda del seguro (puede ser ms barato con su seguro), pero el sitio web puede darle el precio si no utiliz ningn seguro.  - Puede imprimir el cupn correspondiente y llevarlo con su receta a la farmacia.  - Tambin puede pasar por nuestra oficina durante el horario de atencin regular y recoger una tarjeta de cupones de GoodRx.  - Si necesita que su receta se enve electrnicamente a una farmacia diferente, informe a nuestra oficina a travs de MyChart de [facility] o por telfono llamando al [phone] y presione la opcin 4.  

## 2022-11-16 NOTE — Progress Notes (Signed)
   Follow-Up Visit   Subjective  Abigail Powers is a 82 y.o. female who presents for the following: Other (Patient here today for treatment of bx proven bcc at chest with ED&C. ). She asks about discoloration on her legs.  The following portions of the chart were reviewed this encounter and updated as appropriate:  Tobacco  Allergies  Meds  Problems  Med Hx  Surg Hx  Fam Hx      Review of Systems: No other skin or systemic complaints except as noted in HPI or Assessment and Plan.   Objective  Well appearing patient in no apparent distress; mood and affect are within normal limits.  A focused examination was performed including mid upper chest. Relevant physical exam findings are noted in the Assessment and Plan.  mid upper chest Healing pink scar    Assessment & Plan  BCC (basal cell carcinoma), chest mid upper chest  Destruction of lesion  Destruction method: electrodesiccation and curettage   Informed consent: discussed and consent obtained   Timeout:  patient name, date of birth, surgical site, and procedure verified Patient was prepped and draped in usual sterile fashion: area prepped with isopropyl alcohol. Anesthesia: the lesion was anesthetized in a standard fashion   Anesthetic:  1% lidocaine w/ epinephrine 1-100,000 buffered w/ 8.4% NaHCO3 Curettage performed in three different directions: Yes   Electrodesiccation performed over the curetted area: Yes   Curettage cycles:  3 Final wound size (cm):  1.1 Hemostasis achieved with:  electrodesiccation Outcome: patient tolerated procedure well with no complications   Post-procedure details: wound care instructions given   Additional details:  Mupirocin and a pressure dressing applied  Date 08/29/2022 Accession: [id number] mid upper chest Bx proven SUPERFICIAL BASAL CELL CARCINOMA  Treat with [facility] today     Return for keep follow up as scheduled 02/28/23. I, Ruthell Rummage, CMA, am acting as  scribe for Forest Gleason, MD.  Documentation: I have reviewed the above documentation for accuracy and completeness, and I agree with the above.  Forest Gleason, MD

## 2023-02-27 ENCOUNTER — Other Ambulatory Visit: Payer: Self-pay | Admitting: Neurosurgery

## 2023-02-28 ENCOUNTER — Encounter: Payer: Medicare Other | Admitting: Dermatology

## 2023-03-01 ENCOUNTER — Other Ambulatory Visit: Payer: Self-pay | Admitting: Neurosurgery

## 2023-03-06 NOTE — Progress Notes (Signed)
     Your surgery and Pre-Admission testing visit will be at [facility] located at [address], [place], [place] [address].  Please let all your doctors (i.e., Primary Care Physician, Cardiologist, Endocrinologist, Pulmonologist) know you are having surgery. You may need clearance for surgery. If you are on blood thinners, notify your surgeon and ask the doctor who prescribed them how long to hold them before surgery.  If you have had a heart test, such as an EKG, stress test, heart ultrasound, etc., or lab work performed outside of [facility], please bring copies of these tests to your Pre-Admission testing, if possible.  These departments may contact you before the day of surgery:  Pre-Service Center - insurance/ billing: [phone] Pharmacy- to review your medications: [phone] Pre-Admission Testing- to set an appointment for your visit: [phone]  (Often, these numbers show up as "SPAM" on your phone)  The Pre-Admission Testing (PAT) visit focuses on Anesthesia for your upcoming surgery.  You do NOT need to fast; take your medications as usual. Please arrive 30 minutes early to allow for parking and admitting.  The visit may last up to an hour. Bring a photo ID and medical insurance card. Reschedule if you are sick. ([phone]) and please, NO children under age 16 at the visit.  During the PAT visit:  We will review your medical and surgical history.   You will receive pre-operative instructions, including the time of arrival at the hospital and surgical start time.  We will review what medication(s) you can take on the day of surgery.  After speaking with the nurse, you will have blood drawn and, if needed, a chest x-ray and EKG.  Most lab results from your doctor are good for 30 days, Hemoglobin A1C is good for 60 days. If you cannot talk to the Pharmacy, bring your medications or a list of them to the PST visit.   Infection control for the [facility] requires: All fingernail and toenail products should be removed before the day of surgery.  (SNS, Acrylic, Gel, Polish, Stickers, Press on, and Poly gel nails.)   Parking information:  Address: [facility] - [address], [place], [place] [address]  Please look for signs for entrance A off of [address]. Free valet parking is available Monday-Friday 05:30am-06:00pm     

## 2023-03-07 ENCOUNTER — Encounter (HOSPITAL_COMMUNITY): Payer: Self-pay

## 2023-03-07 NOTE — Pre-Procedure Instructions (Signed)
Surgical Instructions    Your procedure is scheduled on March 14, 2023.  Report to [facility] Main Entrance "A" at 6:30 A.M., then check in with the Admitting office.  Call this number if you have problems the morning of surgery:  [phone]  If you have any questions prior to your surgery date call [phone]: Open Monday-Friday 8am-4pm If you experience any cold or flu symptoms such as cough, fever, chills, shortness of breath, etc. between now and your scheduled surgery, please notify us at the above number.     Remember:  Do not eat or drink after midnight the night before your surgery     Take these medicines the morning of surgery with A SIP OF WATER:  budesonide-formoterol (SYMBICORT) inhaler  dicyclomine (BENTYL)   docusate sodium (COLACE)   fluticasone (FLONASE) nasal spray    May take these medicines IF NEEDED:  HYDROcodone-acetaminophen (NORCO/VICODIN)   SUMAtriptan (IMITREX)   VENTOLIN HFA inhaler    As of today, STOP taking any Aspirin (unless otherwise instructed by your surgeon) Aleve, Naproxen, Ibuprofen, Motrin, Advil, Goody's, BC's, all herbal medications, fish oil, and all vitamins. This includes your medication: diclofenac Sodium (VOLTAREN) GEL                      Do NOT Smoke (Tobacco/Vaping) for 24 hours prior to your procedure.  If you use a CPAP at night, you may bring your mask/headgear for your overnight stay.   Contacts, glasses, piercing's, hearing aid's, dentures or partials may not be worn into surgery, please bring cases for these belongings.    For patients admitted to the hospital, discharge time will be determined by your treatment team.   Patients discharged the day of surgery will not be allowed to drive home, and someone needs to stay with them for 24 hours.  SURGICAL WAITING ROOM VISITATION Patients having surgery or a procedure may have no more than 2 support people in the waiting area - these visitors may rotate.   Children under  the age of 30 must have an adult with them who is not the patient. If the patient needs to stay at the hospital during part of their recovery, the visitor guidelines for inpatient rooms apply. Pre-op nurse will coordinate an appropriate time for 1 support person to accompany patient in pre-op.  This support person may not rotate.   Please refer to the [facility] website for the visitor guidelines for Inpatients (after your surgery is over and you are in a regular room).   If you received a COVID test during your pre-op visit  it is requested that you wear a mask when out in public, stay away from anyone that may not be feeling well and notify your surgeon if you develop symptoms. If you have been in contact with anyone that has tested positive in the last 10 days please notify you surgeon.    Pre-operative 5 CHG Bath Instructions   You can play a key role in reducing the risk of infection after surgery. Your skin needs to be as free of germs as possible. You can reduce the number of germs on your skin by washing with CHG (chlorhexidine gluconate) soap before surgery. CHG is an antiseptic soap that kills germs and continues to kill germs even after washing.   DO NOT use if you have an allergy to chlorhexidine/CHG or antibacterial soaps. If your skin becomes reddened or irritated, stop using the CHG and notify one of our  RNs at [phone].   Please shower with the CHG soap starting 4 days before surgery using the following schedule:     Please keep in mind the following:  DO NOT shave, including legs and underarms, starting the day of your first shower.   You may shave your face at any point before/day of surgery.  Place clean sheets on your bed the day you start using CHG soap. Use a clean washcloth (not used since being washed) for each shower. DO NOT sleep with pets once you start using the CHG.   CHG Shower Instructions:  If you choose to wash your hair and private area, wash first  with your normal shampoo/soap.  After you use shampoo/soap, rinse your hair and body thoroughly to remove shampoo/soap residue.  Turn the water OFF and apply about 3 tablespoons (45 ml) of CHG soap to a CLEAN washcloth.  Apply CHG soap ONLY FROM YOUR NECK DOWN TO YOUR TOES (washing for 3-5 minutes)  DO NOT use CHG soap on face, private areas, open wounds, or sores.  Pay special attention to the area where your surgery is being performed.  If you are having back surgery, having someone wash your back for you may be helpful. Wait 2 minutes after CHG soap is applied, then you may rinse off the CHG soap.  Pat dry with a clean towel  Put on clean clothes/pajamas   If you choose to wear lotion, please use ONLY the CHG-compatible lotions on the back of this paper.     Additional instructions for the day of surgery: DO NOT APPLY any lotions, deodorants, cologne, or perfumes.   Do not wear jewelry or makeup Do not wear nail polish, gel polish, artificial nails, or any other type of covering on natural nails (fingers and toes) Do not bring valuables to the hospital. [facility] is not responsible for any belongings or valuables. Put on clean/comfortable clothes.  Brush your teeth.  Ask your nurse before applying any prescription medications to the skin.      CHG Compatible Lotions   Aveeno Moisturizing lotion  Cetaphil Moisturizing Cream  Cetaphil Moisturizing Lotion  Clairol Herbal Essence Moisturizing Lotion, Dry Skin  Clairol Herbal Essence Moisturizing Lotion, Extra Dry Skin  Clairol Herbal Essence Moisturizing Lotion, Normal Skin  Curel Age Defying Therapeutic Moisturizing Lotion with Alpha Hydroxy  Curel Extreme Care Body Lotion  Curel Soothing Hands Moisturizing Hand Lotion  Curel Therapeutic Moisturizing Cream, Fragrance-Free  Curel Therapeutic Moisturizing Lotion, Fragrance-Free  Curel Therapeutic Moisturizing Lotion, Original Formula  Eucerin Daily Replenishing Lotion  Eucerin  Dry Skin Therapy Plus Alpha Hydroxy Crme  Eucerin Dry Skin Therapy Plus Alpha Hydroxy Lotion  Eucerin Original Crme  Eucerin Original Lotion  Eucerin Plus Crme Eucerin Plus Lotion  Eucerin TriLipid Replenishing Lotion  Keri Anti-Bacterial Hand Lotion  Keri Deep Conditioning Original Lotion Dry Skin Formula Softly Scented  Keri Deep Conditioning Original Lotion, Fragrance Free Sensitive Skin Formula  Keri Lotion Fast Absorbing Fragrance Free Sensitive Skin Formula  Keri Lotion Fast Absorbing Softly Scented Dry Skin Formula  Keri Original Lotion  Keri Skin Renewal Lotion Keri Silky Smooth Lotion  Keri Silky Smooth Sensitive Skin Lotion  Nivea Body Creamy Conditioning Oil  Nivea Body Extra Enriched Teacher, adult education Moisturizing Lotion Nivea Crme  Nivea Skin Firming Lotion  NutraDerm 30 Skin Lotion  NutraDerm Skin Lotion  NutraDerm Therapeutic Skin Cream  NutraDerm Therapeutic Skin Lotion  ProShield Protective Hand Cream  Provon moisturizing lotion   Please read over the following fact sheets that you were given.

## 2023-03-07 NOTE — Progress Notes (Signed)
CT Chest x-ray - 10/17/22 EKG - 12/25/21 Stress Test - n/a ECHO - n/a Cardiac Cath - n/a  ICD Pacemaker/Loop - n/a  Sleep Study -  n/a CPAP - none  Diabetes Type - n/a  NPO  Anesthesia review: Yes  Patient's husband in room during PAT appt.   STOP now taking any Aspirin (unless otherwise instructed by your surgeon), Aleve, Naproxen, Voltaren Gel, Ibuprofen, Motrin, Advil, Goody's, BC's, all herbal medications, fish oil, and all vitamins.   Coronavirus Screening Do you have any of the following symptoms:  Cough yes/no: No Fever (>100.25F)  yes/no: No Runny nose yes/no: No Sore throat yes/no: No Difficulty breathing/shortness of breath  yes/no: No  Have you traveled in the last 14 days and where? yes/no: No  Patient verbalized understanding of instructions that were given to them at the PAT appointment. Patient was also instructed that they will need to review over the PAT instructions again at home before surgery.

## 2023-03-08 ENCOUNTER — Encounter (HOSPITAL_COMMUNITY)
Admission: RE | Admit: 2023-03-08 | Discharge: 2023-03-08 | Disposition: A | Discharge status: Home / Self Care | Payer: Medicare Other | Source: Ambulatory Visit | Attending: Neurosurgery | Admitting: Neurosurgery

## 2023-03-08 ENCOUNTER — Other Ambulatory Visit: Payer: Self-pay

## 2023-03-08 ENCOUNTER — Encounter (HOSPITAL_COMMUNITY): Payer: Self-pay

## 2023-03-08 VITALS — BP 150/64 | HR 67 | Temp 98.4°F | Resp 18 | Ht 63.0 in | Wt 101.0 lb

## 2023-03-08 DIAGNOSIS — I700 Atherosclerosis of aorta: Secondary | ICD-10-CM | POA: Insufficient documentation

## 2023-03-08 DIAGNOSIS — R590 Localized enlarged lymph nodes: Secondary | ICD-10-CM | POA: Insufficient documentation

## 2023-03-08 DIAGNOSIS — J4489 Other specified chronic obstructive pulmonary disease: Secondary | ICD-10-CM | POA: Insufficient documentation

## 2023-03-08 DIAGNOSIS — Z01812 Encounter for preprocedural laboratory examination: Secondary | ICD-10-CM | POA: Insufficient documentation

## 2023-03-08 DIAGNOSIS — I2510 Atherosclerotic heart disease of native coronary artery without angina pectoris: Secondary | ICD-10-CM | POA: Diagnosis not present

## 2023-03-08 DIAGNOSIS — Z01818 Encounter for other preprocedural examination: Secondary | ICD-10-CM

## 2023-03-08 HISTORY — DX: Unspecified dementia, unspecified severity, without behavioral disturbance, psychotic disturbance, mood disturbance, and anxiety: F03.90

## 2023-03-08 HISTORY — DX: Headache, unspecified: R51.9

## 2023-03-08 HISTORY — DX: Personal history of other medical treatment: Z92.89

## 2023-03-08 HISTORY — DX: Pneumonia, unspecified organism: J18.9

## 2023-03-08 HISTORY — DX: Dyspnea, unspecified: R06.00

## 2023-03-08 HISTORY — DX: Attention-deficit hyperactivity disorder, unspecified type: F90.9

## 2023-03-08 HISTORY — DX: Unspecified osteoarthritis, unspecified site: M19.90

## 2023-03-08 LAB — COMPREHENSIVE METABOLIC PANEL
ALT: 17 U/L (ref 0–44)
AST: 21 U/L (ref 15–41)
Albumin: 3.9 g/dL (ref 3.5–5.0)
Alkaline Phosphatase: 59 U/L (ref 38–126)
Anion gap: 10 (ref 5–15)
BUN: 18 mg/dL (ref 8–23)
CO2: 27 mmol/L (ref 22–32)
Calcium: 9.4 mg/dL (ref 8.9–10.3)
Chloride: 104 mmol/L (ref 98–111)
Creatinine, Ser: 0.69 mg/dL (ref 0.44–1.00)
GFR, Estimated: >60 mL/min (ref >60)
Glucose, Bld: 103 mg/dL — ABNORMAL HIGH (ref 70–99)
Potassium: 4.1 mmol/L (ref 3.5–5.1)
Sodium: 141 mmol/L (ref 135–145)
Total Bilirubin: 0.7 mg/dL (ref 0.3–1.2)
Total Protein: 6.6 g/dL (ref 6.5–8.1)

## 2023-03-08 LAB — SURGICAL PCR SCREEN
MRSA, PCR: NEGATIVE
Staphylococcus aureus: NEGATIVE

## 2023-03-08 LAB — CBC
HCT: 42.8 % (ref 36.0–46.0)
Hemoglobin: 13.9 g/dL (ref 12.0–15.0)
MCH: 32 pg (ref 26.0–34.0)
MCHC: 32.5 g/dL (ref 30.0–36.0)
MCV: 98.4 fL (ref 80.0–100.0)
Platelets: 304 10*3/uL (ref 150–400)
RBC: 4.35 MIL/uL (ref 3.87–5.11)
RDW: 13.2 % (ref 11.5–15.5)
WBC: 4.3 10*3/uL (ref 4.0–10.5)
nRBC: 0 % (ref 0.0–0.2)

## 2023-03-08 LAB — TYPE AND SCREEN
ABO/RH(D): A POS
Antibody Screen: NEGATIVE

## 2023-03-08 NOTE — Progress Notes (Signed)
Surgical Instructions    Your procedure is scheduled on Wednesday, March 14, 2023.Marland Kitchen  Report to [facility] Main Entrance "A" at 6:30 A.M., then check in with the Admitting office.  Call this number if you have problems the morning of surgery:  [phone]   If you have any questions prior to your surgery date call [phone]: Open Monday-Friday 8am-4pm If you experience any cold or flu symptoms such as cough, fever, chills, shortness of breath, etc. between now and your scheduled surgery, please notify us at the above number     Remember:  Do not eat or drink after midnight the night before your surgery     Take these medicines the morning of surgery with A SIP OF WATER:  Symbicort Inhaler PRN Klonopin PRN Colace Flonase Nasal Spray Imitrex PRN Ventolin Inhaler Prn    As of today, STOP taking any Aspirin (unless otherwise instructed by your surgeon) Aleve, Naproxen, Ibuprofen, Motrin, Advil, Goody's, BC's, all herbal medications, fish oil, and all vitamins.           Do not wear jewelry or makeup. Do not wear lotions, powders, perfumes or deodorant. Do not shave 48 hours prior to surgery.   Do not bring valuables to the hospital. Do not wear nail polish, gel polish, artificial nails, or any other type of covering on natural nails (fingers and toes) If you have artificial nails or gel coating that need to be removed by a nail salon, please have this removed prior to surgery. Artificial nails or gel coating may interfere with anesthesia's ability to adequately monitor your vital signs.  [facility] is not responsible for any belongings or valuables.    Do NOT Smoke (Tobacco/Vaping)  24 hours prior to your procedure  If you use a CPAP at night, you may bring your mask for your overnight stay.   Contacts, glasses, hearing aids, dentures or partials may not be worn into surgery, please bring cases for these belongings   For patients admitted to the hospital, discharge time  will be determined by your treatment team.   Patients discharged the day of surgery will not be allowed to drive home, and someone needs to stay with them for 24 hours.   SURGICAL WAITING ROOM VISITATION Patients having surgery or a procedure may have no more than 2 support people in the waiting area - these visitors may rotate.   Children under the age of 9 must have an adult with them who is not the patient. If the patient needs to stay at the hospital during part of their recovery, the visitor guidelines for inpatient rooms apply. Pre-op nurse will coordinate an appropriate time for 1 support person to accompany patient in pre-op.  This support person may not rotate.   Please refer to [email or web address] for the visitor guidelines for Inpatients (after your surgery is over and you are in a regular room).    Special instructions:    Oral Hygiene is also important to reduce your risk of infection.  Remember - BRUSH YOUR TEETH THE MORNING OF SURGERY WITH YOUR REGULAR TOOTHPASTE   - Preparing For Surgery  Before surgery, you can play an important role. Because skin is not sterile, your skin needs to be as free of germs as possible. You can reduce the number of germs on your skin by washing with CHG (chlorahexidine gluconate) Soap before surgery.  CHG is an antiseptic cleaner which kills germs and bonds with the skin to continue killing germs even  after washing.     Please do not use if you have an allergy to CHG or antibacterial soaps. If your skin becomes reddened/irritated stop using the CHG.  Do not shave (including legs and underarms) for at least 48 hours prior to first CHG shower. It is OK to shave your face.  Please follow these instructions carefully.     Shower the NIGHT BEFORE SURGERY and the MORNING OF SURGERY with CHG Soap.   If you chose to wash your hair, wash your hair first as usual with your normal shampoo.  After you shampoo, rinse your hair and body thoroughly to remove the shampoo.  Then [employer] and genitals (private parts) with your normal soap and rinse thoroughly to remove soap.  After that Use CHG Soap as you would any other liquid soap. You can apply CHG directly to the skin and wash gently with a scrungie or a clean washcloth.   Apply the CHG Soap to your body ONLY FROM THE NECK DOWN.  Do not use on open wounds or open sores. Avoid contact with your eyes, ears, mouth and genitals (private parts). Wash Face and genitals (private parts)  with your normal soap.   Wash thoroughly, paying special attention to the area where your surgery will be performed.  Thoroughly rinse your body with warm water from the neck down.  DO NOT shower/wash with your normal soap after using and rinsing off the CHG Soap.  Pat yourself dry with a CLEAN TOWEL.  Wear CLEAN PAJAMAS to bed the night before surgery  Place CLEAN SHEETS on your bed the night before your surgery  DO NOT SLEEP WITH PETS.   Day of Surgery:  Take a shower with CHG soap. Wear Clean/Comfortable clothing the morning of surgery Do not apply any deodorants/lotions.   Remember to brush your teeth WITH YOUR REGULAR TOOTHPASTE.    If you received a COVID test during your pre-op visit, it is requested that you wear a mask when out in public, stay away from anyone that may not be feeling well, and notify your surgeon if you develop symptoms. If you have been in contact with anyone that has tested positive in the last 10 days, please notify your surgeon.    Please read over the following fact sheets that you were given.

## 2023-03-09 NOTE — Progress Notes (Signed)
Anesthesia Chart Review:  Patient follows with pulmonology at [facility] for history of COPD/asthma, chronic recurrent cough, bronchiectasis (diagnosis for back in 2000).  Per notes, she had PFTs April 2023 showing FEV1 100% with normal DLCO and lung volumes.  She is maintained on Symbicort and as needed albuterol.  Last seen 09/07/2022 by Dr. Karna Christmas and noted to be doing quite well on current regimen, only complaint at that time was hoarseness.  She was recommended to continue her current medication regimen.  Preop labs reviewed, WNL.  CT chest 10/17/2022: IMPRESSION: 1. Progression of basilar and peripheral predominant areas of nodular consolidation and ground-glass. Correlate with ongoing infectious symptoms as multifocal pneumonia could have this appearance. Alternatively, inflammatory etiologies including organizing pneumonia could look similar. If not already performed, recommend pulmonary consultation for eventual tissue sampling. 2. Mild right hilar and infrahilar adenopathy, most likely reactive. 3. Coronary artery atherosclerosis. Aortic Atherosclerosis (ICD10-I70.0).     [facility] Short Stay Center/Anesthesiology Phone [phone] 03/09/2023 2:03 PM

## 2023-03-09 NOTE — Anesthesia Preprocedure Evaluation (Signed)
Anesthesia Evaluation  Patient identified by MRN, date of birth, ID band Patient awake    Reviewed: Allergy & Precautions, NPO status , Patient's Chart, lab work & pertinent test results  Airway Mallampati: I  TM Distance: >3 FB Neck ROM: Full    Dental no notable dental hx. (+) Teeth Intact, Dental Advisory Given   Pulmonary asthma , COPD,  COPD inhaler, Patient abstained from smoking.   Pulmonary exam normal breath sounds clear to auscultation       Cardiovascular negative cardio ROS Normal cardiovascular exam Rhythm:Regular Rate:Normal     Neuro/Psych  Headaches PSYCHIATRIC DISORDERS Anxiety Depression   Dementia    GI/Hepatic ,GERD  ,,(+) Cirrhosis         Endo/Other  negative endocrine ROS    Renal/GU negative Renal ROS  negative genitourinary   Musculoskeletal  (+) Arthritis ,    Abdominal   Peds  Hematology negative hematology ROS (+)   Anesthesia Other Findings   Reproductive/Obstetrics                             Anesthesia Physical Anesthesia Plan  ASA: 3  Anesthesia Plan: General   Post-op Pain Management: Tylenol PO (pre-op)*   Induction: Intravenous  PONV Risk Score and Plan: 3 and Dexamethasone, Ondansetron and Treatment may vary due to age or medical condition  Airway Management Planned: Oral ETT  Additional Equipment:   Intra-op Plan:   Post-operative Plan: Extubation in OR  Informed Consent: I have reviewed the patients History and Physical, chart, labs and discussed the procedure including the risks, benefits and alternatives for the proposed anesthesia with the patient or authorized representative who has indicated his/her understanding and acceptance.     Dental advisory given  Plan Discussed with: CRNA  Anesthesia Plan Comments:         Anesthesia Quick Evaluation

## 2023-03-14 ENCOUNTER — Encounter (HOSPITAL_COMMUNITY)
Admission: RE | Disposition: A | Discharge status: Home / Self Care | Payer: Self-pay | Source: Home / Self Care | Attending: Neurosurgery

## 2023-03-14 ENCOUNTER — Other Ambulatory Visit: Payer: Self-pay

## 2023-03-14 ENCOUNTER — Encounter (HOSPITAL_COMMUNITY): Payer: Self-pay | Admitting: Neurosurgery

## 2023-03-14 ENCOUNTER — Ambulatory Visit (HOSPITAL_COMMUNITY): Payer: Medicare Other | Admitting: Physician Assistant

## 2023-03-14 ENCOUNTER — Ambulatory Visit (HOSPITAL_BASED_OUTPATIENT_CLINIC_OR_DEPARTMENT_OTHER): Payer: Medicare Other | Admitting: Certified Registered"

## 2023-03-14 ENCOUNTER — Ambulatory Visit (HOSPITAL_COMMUNITY): Payer: Medicare Other

## 2023-03-14 ENCOUNTER — Ambulatory Visit (HOSPITAL_COMMUNITY)
Admission: RE | Admit: 2023-03-14 | Discharge: 2023-03-15 | Disposition: A | Discharge status: Home / Self Care | Payer: Medicare Other | Attending: Neurosurgery | Admitting: Neurosurgery

## 2023-03-14 DIAGNOSIS — F418 Other specified anxiety disorders: Secondary | ICD-10-CM | POA: Diagnosis not present

## 2023-03-14 DIAGNOSIS — F0390 Unspecified dementia without behavioral disturbance: Secondary | ICD-10-CM | POA: Diagnosis not present

## 2023-03-14 DIAGNOSIS — M5416 Radiculopathy, lumbar region: Secondary | ICD-10-CM

## 2023-03-14 DIAGNOSIS — M48062 Spinal stenosis, lumbar region with neurogenic claudication: Secondary | ICD-10-CM | POA: Diagnosis present

## 2023-03-14 DIAGNOSIS — J4489 Other specified chronic obstructive pulmonary disease: Secondary | ICD-10-CM | POA: Diagnosis not present

## 2023-03-14 DIAGNOSIS — J449 Chronic obstructive pulmonary disease, unspecified: Secondary | ICD-10-CM

## 2023-03-14 DIAGNOSIS — Z7951 Long term (current) use of inhaled steroids: Secondary | ICD-10-CM | POA: Insufficient documentation

## 2023-03-14 HISTORY — PX: LUMBAR LAMINECTOMY/DECOMPRESSION MICRODISCECTOMY: SHX5026

## 2023-03-14 LAB — ABO/RH: ABO/RH(D): A POS

## 2023-03-14 SURGERY — LUMBAR LAMINECTOMY/DECOMPRESSION MICRODISCECTOMY 2 LEVELS
Anesthesia: General | Site: Spine Lumbar | Laterality: Bilateral

## 2023-03-14 MED ORDER — BACITRACIN ZINC 500 UNIT/GM EX OINT
TOPICAL_OINTMENT | CUTANEOUS | Status: AC
  Filled 2023-03-14: qty 28.35

## 2023-03-14 MED ORDER — ALBUTEROL SULFATE (2.5 MG/3ML) 0.083% IN NEBU: 2.5000 mg | INHALATION_SOLUTION | Freq: Four times a day (QID) | RESPIRATORY_TRACT | Status: DC | PRN

## 2023-03-14 MED ORDER — ONDANSETRON HCL 4 MG/2ML IJ SOLN
INTRAMUSCULAR | Status: DC | PRN
  Administered 2023-03-14: 4 mg via INTRAVENOUS

## 2023-03-14 MED ORDER — ACETAMINOPHEN 325 MG PO TABS: 650.0000 mg | ORAL_TABLET | ORAL | Status: DC | PRN

## 2023-03-14 MED ORDER — THROMBIN 5000 UNITS EX SOLR
CUTANEOUS | Status: AC
  Filled 2023-03-14: qty 5000

## 2023-03-14 MED ORDER — FLUTICASONE PROPIONATE 50 MCG/ACT NA SUSP
2.0000 | Freq: Every day | NASAL | Status: DC
  Filled 2023-03-14: qty 16

## 2023-03-14 MED ORDER — LACTATED RINGERS IV SOLN: INTRAVENOUS | Status: DC

## 2023-03-14 MED ORDER — DOCUSATE SODIUM 100 MG PO CAPS
100.0000 mg | ORAL_CAPSULE | Freq: Two times a day (BID) | ORAL | Status: DC
  Administered 2023-03-14 (×2): 100 mg via ORAL
  Filled 2023-03-14 (×2): qty 1

## 2023-03-14 MED ORDER — MENTHOL 3 MG MT LOZG: 1.0000 | LOZENGE | OROMUCOSAL | Status: DC | PRN

## 2023-03-14 MED ORDER — DEXAMETHASONE SODIUM PHOSPHATE 10 MG/ML IJ SOLN
INTRAMUSCULAR | Status: AC
  Filled 2023-03-14: qty 1

## 2023-03-14 MED ORDER — ORAL CARE MOUTH RINSE: 15.0000 mL | Freq: Once | OROMUCOSAL | Status: AC

## 2023-03-14 MED ORDER — PHENYLEPHRINE HCL-NACL 20-0.9 MG/250ML-% IV SOLN
INTRAVENOUS | Status: DC | PRN
  Administered 2023-03-14: 50 ug/min via INTRAVENOUS

## 2023-03-14 MED ORDER — LIDOCAINE 2% (20 MG/ML) 5 ML SYRINGE
INTRAMUSCULAR | Status: AC
  Filled 2023-03-14: qty 5

## 2023-03-14 MED ORDER — ATORVASTATIN CALCIUM 10 MG PO TABS
20.0000 mg | ORAL_TABLET | Freq: Every day | ORAL | Status: DC
  Administered 2023-03-14: 20 mg via ORAL
  Filled 2023-03-14: qty 2

## 2023-03-14 MED ORDER — ALBUTEROL SULFATE HFA 108 (90 BASE) MCG/ACT IN AERS: 1.0000 | INHALATION_SPRAY | Freq: Four times a day (QID) | RESPIRATORY_TRACT | Status: DC | PRN

## 2023-03-14 MED ORDER — PHENOL 1.4 % MT LIQD: 1.0000 | OROMUCOSAL | Status: DC | PRN

## 2023-03-14 MED ORDER — VANCOMYCIN HCL IN DEXTROSE 1-5 GM/200ML-% IV SOLN: 1000.0000 mg | INTRAVENOUS | Status: DC

## 2023-03-14 MED ORDER — DICYCLOMINE HCL 10 MG PO CAPS
10.0000 mg | ORAL_CAPSULE | Freq: Every day | ORAL | Status: DC
  Filled 2023-03-14 (×2): qty 1

## 2023-03-14 MED ORDER — PHENYLEPHRINE 80 MCG/ML (10ML) SYRINGE FOR IV PUSH (FOR BLOOD PRESSURE SUPPORT)
PREFILLED_SYRINGE | INTRAVENOUS | Status: AC
  Filled 2023-03-14: qty 10

## 2023-03-14 MED ORDER — ACETAMINOPHEN 10 MG/ML IV SOLN
INTRAVENOUS | Status: DC | PRN
  Administered 2023-03-14: 660 mg via INTRAVENOUS

## 2023-03-14 MED ORDER — ACETAMINOPHEN 650 MG RE SUPP: 650.0000 mg | RECTAL | Status: DC | PRN

## 2023-03-14 MED ORDER — PROPOFOL 10 MG/ML IV BOLUS
INTRAVENOUS | Status: DC | PRN
  Administered 2023-03-14: 60 mg via INTRAVENOUS

## 2023-03-14 MED ORDER — FENTANYL CITRATE (PF) 100 MCG/2ML IJ SOLN
INTRAMUSCULAR | Status: AC
  Filled 2023-03-14: qty 2

## 2023-03-14 MED ORDER — MORPHINE SULFATE (PF) 4 MG/ML IV SOLN: 4.0000 mg | INTRAVENOUS | Status: DC | PRN

## 2023-03-14 MED ORDER — CHLORHEXIDINE GLUCONATE 0.12 % MT SOLN: 15.0000 mL | Freq: Once | OROMUCOSAL | Status: AC

## 2023-03-14 MED ORDER — ONDANSETRON HCL 4 MG PO TABS: 4.0000 mg | ORAL_TABLET | Freq: Four times a day (QID) | ORAL | Status: DC | PRN

## 2023-03-14 MED ORDER — LACTATED RINGERS IV SOLN: INTRAVENOUS | Status: DC | PRN

## 2023-03-14 MED ORDER — CHLORHEXIDINE GLUCONATE CLOTH 2 % EX PADS: 6.0000 | MEDICATED_PAD | Freq: Once | CUTANEOUS | Status: DC

## 2023-03-14 MED ORDER — ROCURONIUM BROMIDE 10 MG/ML (PF) SYRINGE
PREFILLED_SYRINGE | INTRAVENOUS | Status: AC
  Filled 2023-03-14: qty 10

## 2023-03-14 MED ORDER — PROPOFOL 10 MG/ML IV BOLUS
INTRAVENOUS | Status: AC
  Filled 2023-03-14: qty 20

## 2023-03-14 MED ORDER — BACITRACIN ZINC 500 UNIT/GM EX OINT
TOPICAL_OINTMENT | CUTANEOUS | Status: DC | PRN
  Administered 2023-03-14: 1 via TOPICAL

## 2023-03-14 MED ORDER — PROPOFOL 500 MG/50ML IV EMUL
INTRAVENOUS | Status: DC | PRN
  Administered 2023-03-14: 150 ug/kg/min via INTRAVENOUS

## 2023-03-14 MED ORDER — CEFAZOLIN SODIUM-DEXTROSE 2-4 GM/100ML-% IV SOLN
2.0000 g | Freq: Once | INTRAVENOUS | Status: AC
  Administered 2023-03-14: 2 g via INTRAVENOUS

## 2023-03-14 MED ORDER — MOMETASONE FURO-FORMOTEROL FUM 200-5 MCG/ACT IN AERO
2.0000 | INHALATION_SPRAY | Freq: Two times a day (BID) | RESPIRATORY_TRACT | Status: DC
  Filled 2023-03-14: qty 8.8

## 2023-03-14 MED ORDER — ROCURONIUM BROMIDE 10 MG/ML (PF) SYRINGE
PREFILLED_SYRINGE | INTRAVENOUS | Status: DC | PRN
  Administered 2023-03-14: 50 mg via INTRAVENOUS
  Administered 2023-03-14: 10 mg via INTRAVENOUS

## 2023-03-14 MED ORDER — OXYCODONE HCL 5 MG PO TABS
5.0000 mg | ORAL_TABLET | ORAL | Status: DC | PRN
  Administered 2023-03-15 (×2): 5 mg via ORAL
  Filled 2023-03-14 (×2): qty 1

## 2023-03-14 MED ORDER — HYDROCODONE-ACETAMINOPHEN 5-325 MG PO TABS: 1.0000 | ORAL_TABLET | ORAL | Status: DC | PRN

## 2023-03-14 MED ORDER — ZOLPIDEM TARTRATE 5 MG PO TABS: 5.0000 mg | ORAL_TABLET | Freq: Every evening | ORAL | Status: DC | PRN

## 2023-03-14 MED ORDER — BUPIVACAINE-EPINEPHRINE 0.25% -1:200000 IJ SOLN
INTRAMUSCULAR | Status: DC | PRN
  Administered 2023-03-14 (×2): 10 mL

## 2023-03-14 MED ORDER — BUPIVACAINE-EPINEPHRINE (PF) 0.25% -1:200000 IJ SOLN
INTRAMUSCULAR | Status: AC
  Filled 2023-03-14: qty 30

## 2023-03-14 MED ORDER — CYCLOBENZAPRINE HCL 5 MG PO TABS: 5.0000 mg | ORAL_TABLET | Freq: Three times a day (TID) | ORAL | Status: DC | PRN

## 2023-03-14 MED ORDER — SUGAMMADEX SODIUM 200 MG/2ML IV SOLN
INTRAVENOUS | Status: DC | PRN
  Administered 2023-03-14: 100 mg via INTRAVENOUS

## 2023-03-14 MED ORDER — PHENYLEPHRINE 80 MCG/ML (10ML) SYRINGE FOR IV PUSH (FOR BLOOD PRESSURE SUPPORT)
PREFILLED_SYRINGE | INTRAVENOUS | Status: DC | PRN
  Administered 2023-03-14: 160 ug via INTRAVENOUS

## 2023-03-14 MED ORDER — DEXMEDETOMIDINE HCL IN NACL 80 MCG/20ML IV SOLN
INTRAVENOUS | Status: AC
  Filled 2023-03-14: qty 20

## 2023-03-14 MED ORDER — DEXAMETHASONE SODIUM PHOSPHATE 10 MG/ML IJ SOLN
INTRAMUSCULAR | Status: DC | PRN
  Administered 2023-03-14: 10 mg via INTRAVENOUS

## 2023-03-14 MED ORDER — 0.9 % SODIUM CHLORIDE (POUR BTL) OPTIME
TOPICAL | Status: DC | PRN
  Administered 2023-03-14: 1000 mL

## 2023-03-14 MED ORDER — FENTANYL CITRATE (PF) 250 MCG/5ML IJ SOLN
INTRAMUSCULAR | Status: DC | PRN
  Administered 2023-03-14: 100 ug via INTRAVENOUS

## 2023-03-14 MED ORDER — CEFAZOLIN SODIUM-DEXTROSE 2-4 GM/100ML-% IV SOLN
2.0000 g | Freq: Three times a day (TID) | INTRAVENOUS | Status: AC
  Administered 2023-03-14 – 2023-03-15 (×2): 2 g via INTRAVENOUS
  Filled 2023-03-14 (×2): qty 100

## 2023-03-14 MED ORDER — CEFAZOLIN SODIUM-DEXTROSE 2-4 GM/100ML-% IV SOLN
INTRAVENOUS | Status: AC
  Filled 2023-03-14: qty 100

## 2023-03-14 MED ORDER — CHLORHEXIDINE GLUCONATE 0.12 % MT SOLN
OROMUCOSAL | Status: AC
  Administered 2023-03-14: 15 mL via OROMUCOSAL
  Filled 2023-03-14: qty 15

## 2023-03-14 MED ORDER — SUMATRIPTAN SUCCINATE 25 MG PO TABS: 25.0000 mg | ORAL_TABLET | ORAL | Status: DC | PRN

## 2023-03-14 MED ORDER — ONDANSETRON HCL 4 MG/2ML IJ SOLN
INTRAMUSCULAR | Status: AC
  Filled 2023-03-14: qty 2

## 2023-03-14 MED ORDER — SODIUM CHLORIDE 0.9 % IV SOLN: 250.0000 mL | INTRAVENOUS | Status: DC

## 2023-03-14 MED ORDER — FENTANYL CITRATE (PF) 100 MCG/2ML IJ SOLN
25.0000 ug | INTRAMUSCULAR | Status: DC | PRN
  Administered 2023-03-14: 50 ug via INTRAVENOUS
  Administered 2023-03-14: 25 ug via INTRAVENOUS

## 2023-03-14 MED ORDER — TRAZODONE HCL 50 MG PO TABS
50.0000 mg | ORAL_TABLET | Freq: Every day | ORAL | Status: DC
  Administered 2023-03-14: 50 mg via ORAL
  Filled 2023-03-14: qty 1

## 2023-03-14 MED ORDER — SODIUM CHLORIDE 0.9% FLUSH: 3.0000 mL | INTRAVENOUS | Status: DC | PRN

## 2023-03-14 MED ORDER — THROMBIN 5000 UNITS EX SOLR: OROMUCOSAL | Status: DC | PRN

## 2023-03-14 MED ORDER — OXYCODONE HCL 5 MG PO TABS
10.0000 mg | ORAL_TABLET | ORAL | Status: DC | PRN
  Administered 2023-03-14 (×2): 10 mg via ORAL
  Filled 2023-03-14 (×2): qty 2

## 2023-03-14 MED ORDER — FENTANYL CITRATE (PF) 250 MCG/5ML IJ SOLN
INTRAMUSCULAR | Status: AC
  Filled 2023-03-14: qty 5

## 2023-03-14 MED ORDER — SODIUM CHLORIDE 0.9% FLUSH: 3.0000 mL | Freq: Two times a day (BID) | INTRAVENOUS | Status: DC

## 2023-03-14 MED ORDER — LIDOCAINE 2% (20 MG/ML) 5 ML SYRINGE
INTRAMUSCULAR | Status: DC | PRN
  Administered 2023-03-14: 40 mg via INTRAVENOUS

## 2023-03-14 MED ORDER — BISACODYL 10 MG RE SUPP: 10.0000 mg | Freq: Every day | RECTAL | Status: DC | PRN

## 2023-03-14 MED ORDER — ACETAMINOPHEN 500 MG PO TABS
1000.0000 mg | ORAL_TABLET | Freq: Four times a day (QID) | ORAL | Status: DC
  Administered 2023-03-14 – 2023-03-15 (×3): 1000 mg via ORAL
  Filled 2023-03-14 (×4): qty 2

## 2023-03-14 MED ORDER — ONDANSETRON HCL 4 MG/2ML IJ SOLN: 4.0000 mg | Freq: Four times a day (QID) | INTRAMUSCULAR | Status: DC | PRN

## 2023-03-14 SURGICAL SUPPLY — 44 items
APL SKNCLS STERI-STRIP NONHPOA (GAUZE/BANDAGES/DRESSINGS) ×1
BAG COUNTER SPONGE SURGICOUNT (BAG) ×1 IMPLANT
BAG SPNG CNTER NS LX DISP (BAG) ×1
BENZOIN TINCTURE PRP APPL 2/3 (GAUZE/BANDAGES/DRESSINGS) ×1 IMPLANT
BLADE CLIPPER SURG (BLADE) IMPLANT
BUR MATCHSTICK NEURO 3.0 LAGG (BURR) ×1 IMPLANT
BUR PRECISION FLUTE 6.0 (BURR) ×1 IMPLANT
CANISTER SUCT 3000ML PPV (MISCELLANEOUS) ×1 IMPLANT
DRAPE LAPAROTOMY 100X72X124 (DRAPES) ×1 IMPLANT
DRAPE MICROSCOPE SLANT 54X150 (MISCELLANEOUS) ×1 IMPLANT
DRAPE SURG 17X23 STRL (DRAPES) ×4 IMPLANT
DRSG OPSITE POSTOP 4X6 (GAUZE/BANDAGES/DRESSINGS) ×1 IMPLANT
ELECT BLADE 4.0 EZ CLEAN MEGAD (MISCELLANEOUS) ×1
ELECT REM PT RETURN 9FT ADLT (ELECTROSURGICAL) ×1
ELECTRODE BLDE 4.0 EZ CLN MEGD (MISCELLANEOUS) ×1 IMPLANT
ELECTRODE REM PT RTRN 9FT ADLT (ELECTROSURGICAL) ×1 IMPLANT
GAUZE 4X4 16PLY ~~LOC~~+RFID DBL (SPONGE) IMPLANT
GAUZE SPONGE 4X4 12PLY STRL (GAUZE/BANDAGES/DRESSINGS) ×1 IMPLANT
GLOVE BIO SURGEON STRL SZ 6 (GLOVE) ×1 IMPLANT
GLOVE BIO SURGEON STRL SZ8 (GLOVE) ×1 IMPLANT
GLOVE BIO SURGEON STRL SZ8.5 (GLOVE) ×1 IMPLANT
GLOVE BIOGEL PI IND STRL 6.5 (GLOVE) ×1 IMPLANT
GLOVE EXAM NITRILE XL STR (GLOVE) IMPLANT
GOWN STRL REUS W/ TWL LRG LVL3 (GOWN DISPOSABLE) ×1 IMPLANT
GOWN STRL REUS W/ TWL XL LVL3 (GOWN DISPOSABLE) ×1 IMPLANT
GOWN STRL REUS W/TWL 2XL LVL3 (GOWN DISPOSABLE) IMPLANT
GOWN STRL REUS W/TWL LRG LVL3 (GOWN DISPOSABLE) ×1
GOWN STRL REUS W/TWL XL LVL3 (GOWN DISPOSABLE) ×1
HEMOSTAT POWDER KIT SURGIFOAM (HEMOSTASIS) ×1 IMPLANT
KIT BASIN OR (CUSTOM PROCEDURE TRAY) ×1 IMPLANT
KIT TURNOVER KIT B (KITS) ×1 IMPLANT
NEEDLE HYPO 22GX1.5 SAFETY (NEEDLE) ×1 IMPLANT
NS IRRIG 1000ML POUR BTL (IV SOLUTION) ×1 IMPLANT
PACK LAMINECTOMY NEURO (CUSTOM PROCEDURE TRAY) ×1 IMPLANT
PAD ARMBOARD 7.5X6 YLW CONV (MISCELLANEOUS) ×3 IMPLANT
PATTIES SURGICAL .5 X1 (DISPOSABLE) IMPLANT
SPONGE SURGIFOAM ABS GEL SZ50 (HEMOSTASIS) IMPLANT
STRIP CLOSURE SKIN 1/2X4 (GAUZE/BANDAGES/DRESSINGS) ×1 IMPLANT
SUT VIC AB 1 CT1 18XBRD ANBCTR (SUTURE) ×2 IMPLANT
SUT VIC AB 1 CT1 8-18 (SUTURE) ×2
SUT VIC AB 2-0 CP2 18 (SUTURE) ×2 IMPLANT
TOWEL GREEN STERILE (TOWEL DISPOSABLE) ×1 IMPLANT
TOWEL GREEN STERILE FF (TOWEL DISPOSABLE) ×1 IMPLANT
WATER STERILE IRR 1000ML POUR (IV SOLUTION) ×1 IMPLANT

## 2023-03-14 NOTE — Transfer of Care (Signed)
Immediate Anesthesia Transfer of Care Note  Patient: Noga Fogg Rama  Procedure(s) Performed: BILATERAL LUMBAR FOUR-FIVE, LUMBAR FIVE-SACRAL ONE LAMINOTOMY AND FORAMINOTOMY (Bilateral: Spine Lumbar)  Patient Location: PACU  Anesthesia Type:General  Level of Consciousness: awake and alert   Airway & Oxygen Therapy: Patient Spontanous Breathing and Patient connected to face mask oxygen  Post-op Assessment: Report given to RN and Post -op Vital signs reviewed and stable  Post vital signs: Reviewed and stable  Last Vitals:  Vitals Value Taken Time  BP 132/55 03/14/23 1041  Temp    Pulse 75 03/14/23 1044  Resp 26 03/14/23 1044  SpO2 96 % 03/14/23 1044  Vitals shown include unvalidated device data.  Last Pain:  Vitals:   03/14/23 0726  PainSc: 3       Patients Stated Pain Goal: 2 (03/14/23 0726)  Complications: No notable events documented.

## 2023-03-14 NOTE — Anesthesia Procedure Notes (Signed)
Procedure Name: Intubation Date/Time: 03/14/2023 8:53 AM  Performed by: Alwyn Ren, CRNAPre-anesthesia Checklist: Patient identified, Emergency Drugs available, Suction available and Patient being monitored Patient Re-evaluated:Patient Re-evaluated prior to induction Oxygen Delivery Method: Circle system utilized Preoxygenation: Pre-oxygenation with 100% oxygen Induction Type: IV induction Ventilation: Mask ventilation without difficulty Laryngoscope Size: Mac and 3 Grade View: Grade I Tube type: Oral Tube size: 7.0 mm Number of attempts: 1 Airway Equipment and Method: Stylet Placement Confirmation: ETT inserted through vocal cords under direct vision, positive ETCO2 and breath sounds checked- equal and bilateral Secured at: 22 cm Tube secured with: Tape Dental Injury: Teeth and Oropharynx as per pre-operative assessment

## 2023-03-14 NOTE — Op Note (Signed)
Brief history: The patient is an 82 year old white female who has complained of back and right greater left leg pain consistent with neurogenic claudication/lumbar radiculopathy.  She has failed medical management and was worked up with a lumbar MRI and lumbar x-rays which demonstrated lumbar spinal stenosis most prominent at L4-5 and to a lesser extent at L5-S1.  I discussed the various treatment options with her.  She has decided proceed with surgery.  Preoperative diagnosis: Lumbar spinal stenosis, lumbar radiculopathy, neurogenic claudication, lumbago  Postoperative diagnosis: The same  Procedure: Bilateral L4-5 and L5-S1 laminotomy/foraminotomies   Surgeon: Dr. Delma Officer  Asst.: Hildred Priest, NP  Anesthesia: Gen. endotracheal  Estimated blood loss: 75 cc  Drains: None  Complications: None  Description of procedure: The patient was brought to the operating room by the anesthesia team. General endotracheal anesthesia was induced. The patient was turned to the prone position on the Wilson frame. The patient's lumbosacral region was then prepared with Betadine scrub and Betadine solution. Sterile drapes were applied.  I then injected the area to be incised with Marcaine with epinephrine solution. I then used a scalpel to make a linear midline incision over the L4-5 and L5-S1 intervertebral disc space. I then used electrocautery to perform a right sided subperiosteal dissection exposing the spinous process and lamina of L4-5 and L5-S1 on the right. We obtained intraoperative radiograph to confirm our location. I then inserted the [facility] retractor for exposure.  I used a high-speed drill to perform a laminotomy at L4-5 and L5-S1 on the right. I then used a Kerrison punches to widen the laminotomy and removed the ligamentum flavum at L4-5 and L5-S1 on the right. We then used microdissection to free up the thecal sac and the right L5 and S1 nerve root from the epidural tissue. I then  used a Kerrison punch to perform a foraminotomy at about the right L5 and S1 nerve root.  I then drilled across the midline and used a Kerrison punch to perform a left L4-5 and L5-S1 laminotomy removing the ligamentum flavum and performed foraminotomies about the left L5 and S1 nerve root.  We inspected the root of disc at L4-5 and L5-S1 bilaterally.  There were no herniations.  I then palpated along the ventral surface of the thecal sac and along exit route of the bilateral L5 and S1 nerve root and noted that the neural structures were well decompressed. This completed the decompression.  We then obtained hemostasis using bipolar electrocautery. We irrigated the wound out with saline solution. We then removed the retractor. We then reapproximated the patient's thoracolumbar fascia with interrupted #1 Vicryl suture. We then reapproximated the patient's subcutaneous tissue with interrupted 2-0 Vicryl suture. We then reapproximated patient's skin with Steri-Strips and benzoin. The was then coated with bacitracin ointment. The drapes were removed. The patient was subsequently returned to the supine position where they were extubated by the anesthesia team. The patient was then transported to the postanesthesia care unit in stable condition. All sponge instrument and needle counts were reportedly correct at the end of this case.

## 2023-03-14 NOTE — H&P (Signed)
Subjective: The patient is an 82 year old white female who is complaining of back, buttock and leg pain consistent with neurogenic claudication.  She has failed medical management.  She was worked up with a lumbar MRI which demonstrated spinal stenosis most prominent at L4-5 and L5-S1.  I discussed the various treatment options with her.  She has decided proceed with surgery.  Past Medical History:  Diagnosis Date   ADHD (attention deficit hyperactivity disorder)    Arthritis    Asthma    Basal cell carcinoma 08/29/2022   Mid upper chest. Superficial. 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   left nasal sidewall extending to eyelid / mohs surgery complete 08/24/20    Hyperlipidemia    Pneumonia    x several   Wears hearing aid in both ears    only wears right ear 03/08/23    Past Surgical History:  Procedure Laterality Date   ABDOMINAL HYSTERECTOMY     APPENDECTOMY     CATARACT EXTRACTION W/PHACO Right 11/23/2021   Procedure: CATARACT EXTRACTION PHACO AND INTRAOCULAR LENS PLACEMENT (IOC)RIGHT;  Surgeon: Lockie Mola, MD;  Location: [facility];  Service: Ophthalmology;  Laterality: Right;  7.34 01:06.9   CATARACT EXTRACTION W/PHACO Left 12/07/2021   Procedure: CATARACT EXTRACTION PHACO AND INTRAOCULAR LENS PLACEMENT (IOC) LEFT 5.80 01:01.9;  Surgeon: Lockie Mola, MD;  Location: [facility];  Service: Ophthalmology;  Laterality: Left;   CHOLECYSTECTOMY     COLONOSCOPY     ESOPHAGOGASTRODUODENOSCOPY (EGD) WITH PROPOFOL N/A 08/31/2016   Procedure: ESOPHAGOGASTRODUODENOSCOPY (EGD) WITH PROPOFOL;  Surgeon: Wyline Mood, MD;   Location: [facility];  Service: Endoscopy;  Laterality: N/A;    Allergies  Allergen Reactions   Penicillins Rash    Social History   Tobacco Use   Smoking status: Never   Smokeless tobacco: Never  Substance Use Topics   Alcohol use: No    Family History  Problem Relation Age of Onset   Heart disease Father    Breast cancer Neg Hx    Prior to Admission medications   Medication Sig Start Date End Date Taking? Authorizing Provider  Apoaequorin (PREVAGEN PO) Take 1 capsule by mouth daily.   Yes [provider]  atorvastatin (LIPITOR) 20 MG tablet Take 20 mg by mouth at bedtime. 01/28/20  Yes [provider]  budesonide-formoterol (SYMBICORT) 160-4.5 MCG/ACT inhaler Inhale 2 puffs into the lungs 2 (two) times daily.   Yes [provider]  Cholecalciferol (VITAMIN D-3 PO) Take 2,000 Units by mouth daily.   Yes [provider]  clonazePAM (KLONOPIN) 0.5 MG tablet Take 0.5 mg by mouth daily as needed for anxiety.   Yes [provider]  diclofenac Sodium (VOLTAREN) 1 % GEL Apply 2 g topically at bedtime.   Yes [provider]  dicyclomine (BENTYL) 10 MG capsule TAKE (1) CAPSULE BY MOUTH FOUR TIMES A DAY BEFORE MEALS AND AT BEDTIME Patient taking differently: Take 10 mg by mouth daily. 11/11/18  Yes Wyline Mood, MD  docusate sodium (COLACE) 100 MG capsule Take 100 mg by mouth daily.   Yes [provider]  fluticasone (FLONASE) 50 MCG/ACT nasal spray Place 2 sprays into both nostrils daily.   Yes [provider]  HYDROcodone-acetaminophen (NORCO/VICODIN) 5-325 MG tablet Take 0.5-1 tablets by mouth daily as needed for moderate pain.   Yes [provider]  magnesium oxide (MAG-OX) 400 MG tablet Take 400 mg by mouth daily.   Yes [provider]  SUMAtriptan (IMITREX) 50 MG tablet Take 25 mg by mouth every 2 (two) hours as needed for migraine. May repeat in 2 hours if headache persists or recurs.   Yes  [provider]  traZODone (DESYREL) 50 MG tablet Take 50 mg by mouth at bedtime.   Yes [provider]  VENTOLIN HFA 108 (90 Base) MCG/ACT inhaler Inhale 1-2 puffs into the lungs every 6 (six) hours as needed for wheezing or shortness of breath. 05/29/19  Yes [provider]  clobetasol ointment (TEMOVATE) 0.05 % Apply 1 application topically 2 (two) times daily. For up to 2 weeks as needed for itch. Avoid applying to face, groin, and axilla. Use as directed. Risk of skin atrophy with long-term use reviewed. Patient not taking: Reported on 03/05/2023 05/19/21   Neale Burly, [place], MD  gentamicin cream (GARAMYCIN) 0.1 % Apply 1 application topically 2 (two) times daily. Patient not taking: Reported on 03/05/2023 09/23/19   Felecia Shelling, DPM  methylPREDNISolone (MEDROL DOSEPAK) 4 MG TBPK tablet Take as directed on package Patient not taking: Reported on 03/05/2023 09/03/22   Shaune Pollack, MD  mupirocin ointment (BACTROBAN) 2 % Apply to wound twice a day. Patient not taking: Reported on 03/05/2023 09/03/19   Hyatt, Max T, DPM  oxyCODONE (ROXICODONE) 5 MG immediate release tablet Take 0.5-1 tablets (2.5-5 mg total) by mouth every 6 (six) hours as needed for severe pain or moderate pain (2.5 mg for mod pain, 5 mg for severe pain). Patient not taking: Reported on 03/05/2023 09/03/22 09/03/23  Shaune Pollack, MD  predniSONE (STERAPRED UNI-PAK 21 TAB) 10 MG (21) TBPK tablet 6,5,4,3,2,1 Patient not taking: Reported on 03/05/2023 12/25/21   Orvil Feil, PA-C     Review of Systems  Positive ROS: As above  All other systems have been reviewed and were otherwise negative with the exception of those mentioned in the HPI and as above.  Objective: Vital signs in last 24 hours: Temp:  [98.7 F (37.1 C)] 98.7 F (37.1 C) (06/12 0647) Pulse Rate:  [70] 70 (06/12 0647) Resp:  [18] 18 (06/12 0647) BP: (141)/(66) 141/66 (06/12 0647) SpO2:  [95 %] 95 % (06/12 0647) Weight:  [44.5 kg] 44.5 kg  (06/12 0647) Estimated body mass index is 17.36 kg/m as calculated from the following:   Height as of this encounter: 5\' 3"  (1.6 m).   Weight as of this encounter: 44.5 kg.   General Appearance: Alert Head: Normocephalic, without obvious abnormality, atraumatic Eyes: PERRL, conjunctiva/corneas clear, EOM's intact,    Ears: Normal  Throat: Normal  Neck: Supple, Back: unremarkable Lungs: Clear to auscultation bilaterally, respirations unlabored Heart: Regular rate and rhythm, no murmur, rub or gallop Abdomen: Soft, non-tender Extremities: Extremities normal, atraumatic, no cyanosis or edema Skin: unremarkable  NEUROLOGIC:   Mental status: alert and oriented,Motor Exam - grossly normal Sensory Exam - grossly normal Reflexes:  Coordination - grossly normal Gait - grossly normal Balance - grossly normal Cranial Nerves: I: smell Not tested  II: visual acuity  OS: Normal  OD: Normal   II: visual fields Full to confrontation  II: pupils Equal, round, reactive  to light  III,VII: ptosis None  III,IV,VI: extraocular muscles  Full ROM  V: mastication Normal  V: facial light touch sensation  Normal  V,VII: corneal reflex  Present  VII: facial muscle function - upper  Normal  VII: facial muscle function - lower Normal  VIII: hearing Not tested  IX: soft palate elevation  Normal  IX,X: gag reflex Present  XI: trapezius strength  5/5  XI: sternocleidomastoid strength 5/5  XI: neck flexion strength  5/5  XII: tongue strength  Normal    Data Review Lab Results  Component Value Date   WBC 4.3 03/08/2023   HGB 13.9 03/08/2023   HCT 42.8 03/08/2023   MCV 98.4 03/08/2023   PLT 304 03/08/2023   Lab Results  Component Value Date   NA 141 03/08/2023   K 4.1 03/08/2023   CL 104 03/08/2023   CO2 27 03/08/2023   BUN 18 03/08/2023   CREATININE 0.69 03/08/2023   GLUCOSE 103 (H) 03/08/2023   Lab Results  Component Value Date   INR 1.0 11/11/2018    Assessment/Plan: Lumbar  spine stenosis, lumbar radiculopathy, neurogenic claudication, lumbago: I discussed the situation with the patient.  I reviewed her imaging studies with her and pointed out the abnormalities.  We have discussed the various treatment options including surgery.  I have described the surgical treatment option of bilateral L4-5 and L5-S1 laminotomy/foraminotomies.  I have shown her surgical models.  I have given her surgical pamphlet.  We have discussed the risk, benefits, alternatives, expected postop course, and likelihood of achieving our goals with surgery.  I have answered all her questions.  She has decided proceed with surgery.   Cristi Loron 03/14/2023 7:29 AM

## 2023-03-14 NOTE — Anesthesia Postprocedure Evaluation (Signed)
Anesthesia Post Note  Patient: Tamanika Heiney Perman  Procedure(s) Performed: BILATERAL LUMBAR FOUR-FIVE, LUMBAR FIVE-SACRAL ONE LAMINOTOMY AND FORAMINOTOMY (Bilateral: Spine Lumbar)     Patient location during evaluation: PACU Anesthesia Type: General Level of consciousness: awake and alert Pain management: pain level controlled Vital Signs Assessment: post-procedure vital signs reviewed and stable Respiratory status: spontaneous breathing, nonlabored ventilation, respiratory function stable and patient connected to nasal cannula oxygen Cardiovascular status: blood pressure returned to baseline and stable Postop Assessment: no apparent nausea or vomiting Anesthetic complications: no  No notable events documented.  Last Vitals:  Vitals:   03/14/23 1157 03/14/23 1218  BP: (!) 130/58 (!) 145/70  Pulse: 64 67  Resp: (!) 21 18  Temp: (!) 36.3 C 36.4 C  SpO2: 94% 98%    Last Pain:  Vitals:   03/14/23 1220  PainSc: 3                  Dezeray Puccio L Pier Bosher

## 2023-03-14 NOTE — Progress Notes (Signed)
Per Dr. Lovell Sheehan, ok to order Ancef and cancel order for Vanc.

## 2023-03-15 ENCOUNTER — Encounter (HOSPITAL_COMMUNITY): Payer: Self-pay | Admitting: Neurosurgery

## 2023-03-15 DIAGNOSIS — M48062 Spinal stenosis, lumbar region with neurogenic claudication: Secondary | ICD-10-CM | POA: Diagnosis not present

## 2023-03-15 MED ORDER — HYDROCODONE-ACETAMINOPHEN 5-325 MG PO TABS: 1.0000 | ORAL_TABLET | ORAL | 0 refills | Status: DC | PRN

## 2023-03-15 NOTE — Discharge Summary (Signed)
Physician Discharge Summary  Patient ID: Abigail Powers MRN: [id number] DOB/AGE: 1941-01-18 82 y.o.  Admit date: 03/14/2023 Discharge date: 03/15/2023  Admission Diagnoses: Lumbar spinal stenosis with neurogenic claudication, lumbar radiculopathy, lumbago  Discharge Diagnoses: The same Principal Problem:   Spinal stenosis of lumbar region with neurogenic claudication   Discharged Condition: good  Hospital Course: I performed bilateral L4-5 and L5-S1 laminotomy/foraminotomies on the patient on 03/14/2023.  The surgery went well.  The patient's postoperative course was unremarkable.  On postoperative day #1 she felt better, her sciatica was gone, and she requested discharge home.  The patient was given verbal and written discharge instructions.  All questions were answered.  Consults: PT, care management Significant Diagnostic Studies: None Treatments: Bilateral L4-5 and L5-S1 laminotomy/foraminotomies Discharge Exam: Blood pressure 101/83, pulse 73, temperature 97.8 F (36.6 C), temperature source Oral, resp. rate 18, height 5\' 3"  (1.6 m), weight 44.5 kg, SpO2 97 %. The patient is alert and pleasant.  She looks well.  Her dressing is clean and dry.  Her strength is normal.  Disposition: Home  Discharge Instructions     Call MD for:  difficulty breathing, headache or visual disturbances   Complete by: As directed    Call MD for:  extreme fatigue   Complete by: As directed    Call MD for:  hives   Complete by: As directed    Call MD for:  persistant dizziness or light-headedness   Complete by: As directed    Call MD for:  persistant nausea and vomiting   Complete by: As directed    Call MD for:  redness, tenderness, or signs of infection (pain, swelling, redness, odor or green/yellow discharge around incision site)   Complete by: As directed    Call MD for:  severe uncontrolled pain   Complete by: As directed    Call MD for:  temperature >100.4   Complete by: As  directed    Diet - low sodium heart healthy   Complete by: As directed    Discharge instructions   Complete by: As directed    Call [phone] for a followup appointment. Take a stool softener while you are using pain medications.   Driving Restrictions   Complete by: As directed    Do not drive for 2 weeks.   Increase activity slowly   Complete by: As directed    Lifting restrictions   Complete by: As directed    Do not lift more than 5 pounds. No excessive bending or twisting.   May shower / Bathe   Complete by: As directed    Remove the dressing for 3 days after surgery.  You may shower, but leave the incision alone.   Remove dressing in 48 hours   Complete by: As directed       Allergies as of 03/15/2023       Reactions   Penicillins Rash        Medication List     STOP taking these medications    methylPREDNISolone 4 MG Tbpk tablet Commonly known as: MEDROL DOSEPAK   mupirocin ointment 2 % Commonly known as: Bactroban   oxyCODONE 5 MG immediate release tablet Commonly known as: Roxicodone   predniSONE 10 MG (21) Tbpk tablet Commonly known as: STERAPRED UNI-PAK 21 TAB       TAKE these medications    atorvastatin 20 MG tablet Commonly known as: LIPITOR Take 20 mg by mouth at bedtime.   budesonide-formoterol 160-4.5 MCG/ACT inhaler Commonly known  as: SYMBICORT Inhale 2 puffs into the lungs 2 (two) times daily.   clobetasol ointment 0.05 % Commonly known as: TEMOVATE Apply 1 application topically 2 (two) times daily. For up to 2 weeks as needed for itch. Avoid applying to face, groin, and axilla. Use as directed. Risk of skin atrophy with long-term use reviewed.   dicyclomine 10 MG capsule Commonly known as: BENTYL TAKE (1) CAPSULE BY MOUTH FOUR TIMES A DAY BEFORE MEALS AND AT BEDTIME What changed:  how much to take how to take this when to take this additional instructions   docusate sodium 100 MG capsule Commonly known as: COLACE Take  100 mg by mouth daily.   fluticasone 50 MCG/ACT nasal spray Commonly known as: FLONASE Place 2 sprays into both nostrils daily.   gentamicin cream 0.1 % Commonly known as: GARAMYCIN Apply 1 application topically 2 (two) times daily.   HYDROcodone-acetaminophen 5-325 MG tablet Commonly known as: NORCO/VICODIN Take 1-2 tablets by mouth every 4 (four) hours as needed for moderate pain. What changed:  how much to take when to take this   KlonoPIN 0.5 MG tablet Generic drug: clonazePAM Take 0.5 mg by mouth daily as needed for anxiety.   magnesium oxide 400 MG tablet Commonly known as: MAG-OX Take 400 mg by mouth daily.   PREVAGEN PO Take 1 capsule by mouth daily.   SUMAtriptan 50 MG tablet Commonly known as: IMITREX Take 25 mg by mouth every 2 (two) hours as needed for migraine. May repeat in 2 hours if headache persists or recurs.   traZODone 50 MG tablet Commonly known as: DESYREL Take 50 mg by mouth at bedtime.   Ventolin HFA 108 (90 Base) MCG/ACT inhaler Generic drug: albuterol Inhale 1-2 puffs into the lungs every 6 (six) hours as needed for wheezing or shortness of breath.   VITAMIN D-3 PO Take 2,000 Units by mouth daily.   Voltaren 1 % Gel Generic drug: diclofenac Sodium Apply 2 g topically at bedtime.         Signed: Cristi Loron 03/15/2023, 6:54 AM

## 2023-03-15 NOTE — Plan of Care (Signed)
Pt doing well. Pt and husband given D/C instructions with verbal understanding. Rx was sent to the pharmacy by MD. Pt's incision is clean and dry with no sign of infection. Pt's IV was removed prior to D/C. Pt D/C'd home via wheelchair per MD order. Pt is stable @ D/C and has no other needs at this time. Micayla Brathwaite, RN  

## 2023-03-15 NOTE — Evaluation (Signed)
Occupational Therapy Evaluation Patient Details Name: Abigail Powers MRN: [id number] DOB: [birth year] Today's Date: 03/15/2023   History of Present Illness The patient is a 82 y.o. female presenting s/p L4-S1 Laminotomies and foraminotomies. PMH significant for COPD, HLD.   Clinical Impression   Patient admitted for the diagnosis above.  PTA patient remained active and is Ind with ADL and iADL.  Spouse can provide any needed assist, but patient should do very well.  She is a little impulsive and needed verbal cues for precautions throughout.  No further needs in the acute setting from OT.  Recommend follow up as prescribed by MD.        Recommendations for follow up therapy are one component of a multi-disciplinary discharge planning process, led by the attending physician.  Recommendations may be updated based on patient status, additional functional criteria and insurance authorization.   Assistance Recommended at Discharge Set up Supervision/Assistance  Patient can return home with the following Assist for transportation;Assistance with cooking/housework    Functional Status Assessment  Patient has had a recent decline in their functional status and demonstrates the ability to make significant improvements in function in a reasonable and predictable amount of time.  Equipment Recommendations  None recommended by OT    Recommendations for Other Services       Precautions / Restrictions Precautions Precautions: Fall;Back Precaution Booklet Issued: Yes (comment) Precaution Comments: Provided hand out and verbal cues on precautions provided throughout functional moblity. Restrictions Weight Bearing Restrictions: No      Mobility Bed Mobility Overal bed mobility: Modified Independent                  Transfers Overall transfer level: Modified independent                        Balance Overall balance assessment: Mild deficits observed, not formally  tested                                         ADL either performed or assessed with clinical judgement   ADL Overall ADL's : At baseline                                             Vision Baseline Vision/History: 0 No visual deficits Patient Visual Report: No change from baseline       Perception     Praxis      Pertinent Vitals/Pain Pain Assessment Pain Assessment: Faces Faces Pain Scale: Hurts little more Pain Location: low back Pain Descriptors / Indicators: Tender Pain Intervention(s): Monitored during session     Hand Dominance Left   Extremity/Trunk Assessment Upper Extremity Assessment Upper Extremity Assessment: Overall WFL for tasks assessed   Lower Extremity Assessment Lower Extremity Assessment: Defer to PT evaluation   Cervical / Trunk Assessment Cervical / Trunk Assessment: Back Surgery   Communication Communication Communication: No difficulties   Cognition Arousal/Alertness: Awake/alert Behavior During Therapy: WFL for tasks assessed/performed Overall Cognitive Status: Within Functional Limits for tasks assessed                                       General Comments  Incision site dry and intact    Exercises     Shoulder Instructions      Home Living Family/patient expects to be discharged to:: Private residence Living Arrangements: Spouse/significant other;Children Available Help at Discharge: Family;Available 24 hours/day Type of Home: House Home Access: Stairs to enter [employer] of Steps: 5 Entrance Stairs-Rails: Left Home Layout: One level     Bathroom Shower/Tub: Producer, television/film/video: Standard Bathroom Accessibility: Yes   Home Equipment: Agricultural consultant (2 wheels);Shower seat - built in          Prior Functioning/Environment Prior Level of Function : Independent/Modified Independent             Mobility Comments: Prior to surgery pt  reported independent with functional mobility and ADLs ADLs Comments: Ind with ADL iADL        OT Problem List: Pain      OT Treatment/Interventions:      OT Goals(Current goals can be found in the care plan section) Acute Rehab OT Goals Patient Stated Goal: Return home OT Goal Formulation: With patient Time For Goal Achievement: 03/19/23 Potential to Achieve Goals: Good  OT Frequency:      Co-evaluation              AM-PAC OT "6 Clicks" Daily Activity     Outcome Measure Help from another person eating meals?: None Help from another person taking care of personal grooming?: None Help from another person toileting, which includes using toliet, bedpan, or urinal?: None Help from another person bathing (including washing, rinsing, drying)?: A Little Help from another person to put on and taking off regular upper body clothing?: None Help from another person to put on and taking off regular lower body clothing?: A Little 6 Click Score: 22   End of Session Nurse Communication: Mobility status  Activity Tolerance: Patient tolerated treatment well Patient left: in bed;with call bell/phone within reach;with family/visitor present  OT Visit Diagnosis: Unsteadiness on feet (R26.81)                Time: 1610-9604 OT Time Calculation (min): 19 min Charges:  OT General Charges $OT Visit: 1 Visit OT Evaluation $OT Eval Moderate Complexity: 1 Mod  03/15/2023  RP, OTR/L  Acute Rehabilitation Services  Office:  [phone]   Suzanna Obey 03/15/2023, 9:48 AM

## 2023-03-15 NOTE — Evaluation (Signed)
Physical Therapy Evaluation Patient Details Name: Abigail Powers MRN: [id number] DOB: 11-Sep-1941 Today's Date: 03/15/2023  History of Present Illness  The patient is a 82 y.o. female presenting s/p L4-S1 Laminotomies and foraminotomies (03/15/2023). PMH significant for COPD, HLD.   Clinical Impression  Upon evaluation, patient presented EOB upright sitting getting dressed. Prior to admission, patient was independent with all functional mobility and ADLs. She is supported by her spouse and daughter who are available to help PRN. Currently, pt demonstrated decreased velocity and increased time for transfers but able to perform all functional mobility without physical assistance. Verbal cues for back precautions provided throughout the session with good return demonstration and recall. End of session included education on exercise progression, back precautions, car transfers, and assistive device management. Recommending discharge to home with family support when needed.        Recommendations for follow up therapy are one component of a multi-disciplinary discharge planning process, led by the attending physician.  Recommendations may be updated based on patient status, additional functional criteria and insurance authorization.  Follow Up Recommendations       Assistance Recommended at Discharge Intermittent Supervision/Assistance  Patient can return home with the following  A little help with walking and/or transfers;A little help with bathing/dressing/bathroom;Assist for transportation;Help with stairs or ramp for entrance    Equipment Recommendations None recommended by PT  Recommendations for Other Services       Functional Status Assessment Patient has had a recent decline in their functional status and demonstrates the ability to make significant improvements in function in a reasonable and predictable amount of time.     Precautions / Restrictions Precautions Precautions:  Fall;Back Precaution Booklet Issued: Yes (comment) Precaution Comments: Provided hand out and verbal cues on precautions provided throughout functional moblity. Restrictions Weight Bearing Restrictions: No      Mobility  Bed Mobility               General bed mobility comments: Patient presented sitting upright on EOB; discussed with patient on bed mobility with handout provided.    Transfers Overall transfer level: Modified independent Equipment used: Rolling walker (2 wheels)               General transfer comment: Able to stand up without physical assistance or RW support; used bilateral UE to power up.    Ambulation/Gait Ambulation/Gait assistance: Modified independent (Device/Increase time) Gait Distance (Feet): 300 Feet Assistive device: Rolling walker (2 wheels) Gait Pattern/deviations: Step-through pattern, Decreased step length - right, Decreased step length - left, Decreased stride length, Trunk flexed, Shuffle Gait velocity: Decreased Gait velocity interpretation: <1.8 ft/sec, indicate of risk for recurrent falls   General Gait Details: Presented with step through pattern; cued for proximity to RW and upright stance; minor drift when turning head. Ambulated half the distance with AD pattern, presented with shuffle pattern but no UE support needed.  Stairs Stairs: Yes Stairs assistance: Min guard Stair Management: One rail Right, One rail Left, Alternating pattern Number of Stairs: 5 General stair comments: Able to perform without physical assistance; very cautious with descending stairs  Wheelchair Mobility    Modified Rankin (Stroke Patients Only)       Balance Overall balance assessment: Mild deficits observed, not formally tested  Pertinent Vitals/Pain Pain Assessment Pain Assessment: Faces (Pt reported soreness but no actual pain) Faces Pain Scale: Hurts a little bit Pain  Location: Incision Site Pain Descriptors / Indicators: Sore Pain Intervention(s): Limited activity within patient's tolerance, Monitored during session    Home Living Family/patient expects to be discharged to:: Private residence Living Arrangements: Spouse/significant other;Children Available Help at Discharge: Family;Available 24 hours/day Type of Home: House Home Access: Stairs to enter Entrance Stairs-Rails: Left Entrance Stairs-Number of Steps: 5   Home Layout: One level Home Equipment: Agricultural consultant (2 wheels);Shower seat - built in      Prior Function Prior Level of Function : Independent/Modified Independent             Mobility Comments: Prior to surgery pt reported independent with functional mobility and ADLs ADLs Comments: Ind with ADL iADL     Hand Dominance   Dominant Hand: Left    Extremity/Trunk Assessment   Upper Extremity Assessment Upper Extremity Assessment: Overall WFL for tasks assessed    Lower Extremity Assessment Lower Extremity Assessment: Defer to PT evaluation    Cervical / Trunk Assessment Cervical / Trunk Assessment: Back Surgery  Communication   Communication: No difficulties  Cognition Arousal/Alertness: Awake/alert Behavior During Therapy: WFL for tasks assessed/performed Overall Cognitive Status: Within Functional Limits for tasks assessed                                 General Comments: Very pleasant and motivated throughout session.        General Comments General comments (skin integrity, edema, etc.): Incision site dry and intact    Exercises     Assessment/Plan    PT Assessment Patient does not need any further PT services  PT Problem List         PT Treatment Interventions      PT Goals (Current goals can be found in the Care Plan section)  Acute Rehab PT Goals Patient Stated Goal: Would like to return home without pain PT Goal Formulation: All assessment and education complete, DC  therapy    Frequency       Co-evaluation               AM-PAC PT "6 Clicks" Mobility  Outcome Measure Help needed turning from your back to your side while in a flat bed without using bedrails?: A Little Help needed moving from lying on your back to sitting on the side of a flat bed without using bedrails?: A Little Help needed moving to and from a bed to a chair (including a wheelchair)?: None Help needed standing up from a chair using your arms (e.g., wheelchair or bedside chair)?: None Help needed to walk in hospital room?: None Help needed climbing 3-5 steps with a railing? : A Little 6 Click Score: 21    End of Session Equipment Utilized During Treatment: Gait belt Activity Tolerance: Patient tolerated treatment well Patient left: in bed;with call bell/phone within reach;with family/visitor present Nurse Communication: Mobility status PT Visit Diagnosis: Difficulty in walking, not elsewhere classified (R26.2);Pain Pain - part of body:  (Lower back)    Time: 4540-9811 PT Time Calculation (min) (ACUTE ONLY): 26 min   Charges:   PT Evaluation $PT Eval Low Complexity: 1 Low PT Treatments $Gait Training: 8-22 mins        Christene Lye, SPT Acute Rehabilitation Services [phone] Secure chat preferred    Christene Lye 03/15/2023, 11:12 AM

## 2023-03-29 ENCOUNTER — Encounter: Payer: Medicare Other | Admitting: Dermatology

## 2023-05-16 ENCOUNTER — Encounter: Payer: Medicare Other | Admitting: Dermatology

## 2023-07-17 ENCOUNTER — Ambulatory Visit
Admission: RE | Admit: 2023-07-17 | Discharge: 2023-07-17 | Disposition: A | Discharge status: Home / Self Care | Payer: Medicare Other | Source: Ambulatory Visit | Attending: Internal Medicine | Admitting: Internal Medicine

## 2023-07-17 DIAGNOSIS — Z1231 Encounter for screening mammogram for malignant neoplasm of breast: Secondary | ICD-10-CM | POA: Insufficient documentation

## 2023-08-23 ENCOUNTER — Encounter: Payer: Self-pay | Admitting: Dermatology

## 2023-08-23 ENCOUNTER — Ambulatory Visit: Payer: Medicare Other | Admitting: Dermatology

## 2023-08-23 DIAGNOSIS — L814 Other melanin hyperpigmentation: Secondary | ICD-10-CM

## 2023-08-23 DIAGNOSIS — W908XXA Exposure to other nonionizing radiation, initial encounter: Secondary | ICD-10-CM | POA: Diagnosis not present

## 2023-08-23 DIAGNOSIS — D1801 Hemangioma of skin and subcutaneous tissue: Secondary | ICD-10-CM

## 2023-08-23 DIAGNOSIS — L853 Xerosis cutis: Secondary | ICD-10-CM

## 2023-08-23 DIAGNOSIS — Z1283 Encounter for screening for malignant neoplasm of skin: Secondary | ICD-10-CM | POA: Diagnosis not present

## 2023-08-23 DIAGNOSIS — Z85828 Personal history of other malignant neoplasm of skin: Secondary | ICD-10-CM

## 2023-08-23 DIAGNOSIS — Z872 Personal history of diseases of the skin and subcutaneous tissue: Secondary | ICD-10-CM

## 2023-08-23 DIAGNOSIS — L578 Other skin changes due to chronic exposure to nonionizing radiation: Secondary | ICD-10-CM | POA: Diagnosis not present

## 2023-08-23 DIAGNOSIS — L821 Other seborrheic keratosis: Secondary | ICD-10-CM

## 2023-08-23 DIAGNOSIS — D1724 Benign lipomatous neoplasm of skin and subcutaneous tissue of left leg: Secondary | ICD-10-CM

## 2023-08-23 DIAGNOSIS — D692 Other nonthrombocytopenic purpura: Secondary | ICD-10-CM

## 2023-08-23 DIAGNOSIS — L570 Actinic keratosis: Secondary | ICD-10-CM

## 2023-08-23 DIAGNOSIS — D229 Melanocytic nevi, unspecified: Secondary | ICD-10-CM

## 2023-08-23 NOTE — Patient Instructions (Addendum)

## 2023-08-23 NOTE — Progress Notes (Signed)
Follow-Up Visit   Subjective  Abigail Powers is a 82 y.o. female who presents for the following: Skin Cancer Screening and Full Body Skin Exam. Hx of BCC. Hx of SCC. Hx of AKs.   Check sore area on right forehead. Dur: few months.   The patient presents for Total-Body Skin Exam (TBSE) for skin cancer screening and mole check. The patient has spots, moles and lesions to be evaluated, some may be new or changing and the patient may have concern these could be cancer.    The following portions of the chart were reviewed this encounter and updated as appropriate: medications, allergies, medical history  Review of Systems:  No other skin or systemic complaints except as noted in HPI or Assessment and Plan.  Objective  Well appearing patient in no apparent distress; mood and affect are within normal limits.  A full examination was performed including scalp, head, eyes, ears, nose, lips, neck, chest, axillae, abdomen, back, buttocks, bilateral upper extremities, bilateral lower extremities, hands, feet, fingers, toes, fingernails, and toenails. All findings within normal limits unless otherwise noted below.   Relevant physical exam findings are noted in the Assessment and Plan.  Right Forehead x1, central forehead x1 (2) Erythematous thin papules/macules with gritty scale.     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 [facility] 11/16/2022. - No evidence of recurrence today - Recommend regular full body skin exams - Recommend daily broad spectrum sunscreen SPF 30+ to sun-exposed areas, reapply every 2 hours as needed.  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 [facility] 08/24/2020. - No evidence of recurrence today - No lymphadenopathy - Recommend regular full body skin exams - Recommend daily broad spectrum sunscreen SPF 30+  to sun-exposed areas, reapply every 2 hours as needed.  - Call if any new or changing lesions are noted between office visits     SKIN CANCER SCREENING PERFORMED TODAY.  ACTINIC DAMAGE - Chronic condition, secondary to cumulative UV/sun exposure - diffuse scaly erythematous macules with underlying dyspigmentation - Recommend daily broad spectrum sunscreen SPF 30+ to sun-exposed areas, reapply every 2 hours as needed.  - Staying in the shade or wearing long sleeves, sun glasses (UVA+UVB protection) and wide brim hats (4-inch brim around the entire circumference of the hat) are also recommended for sun protection.  - Call for new or changing lesions.  LENTIGINES, SEBORRHEIC KERATOSES, HEMANGIOMAS - Benign normal skin lesions - Benign-appearing - Call for any changes  MELANOCYTIC NEVI - Tan-brown and/or pink-flesh-colored symmetric macules and papules - Benign appearing on exam today - Observation - Call clinic for new or changing moles - Recommend daily use of broad spectrum spf 30+ sunscreen to sun-exposed areas.    Lipoma  Exam: Subcutaneous rubbery nodule Location: left lower leg  Benign-appearing. Exam most consistent with a lipoma. Discussed that a lipoma is a benign fatty growth that can grow over time and sometimes get irritated. Recommend observation if it is not bothersome or changing. Discussed option of ILK injections or surgical excision to remove it if it is growing, symptomatic, or other changes noted. Please call for new or changing lesions so they can be evaluated.  Purpura - Chronic; persistent and recurrent.  Treatable, but not curable. - Violaceous macules and patches at arms and legs - Benign - Related to trauma,  age, sun damage and/or use of blood thinners, chronic use of topical and/or oral steroids - Observe - Can use OTC arnica containing moisturizer such as Dermend Bruise Formula if desired - Call for worsening or other concerns  Xerosis - diffuse  xerotic patches - recommend gentle, hydrating skin care - gentle skin care handout given - Recommend CeraVe cream daily.    AK (actinic keratosis) (2) Right Forehead x1, central forehead x1  Actinic keratoses are precancerous spots that appear secondary to cumulative UV radiation exposure/sun exposure over time. They are chronic with expected duration over 1 year. A portion of actinic keratoses will progress to squamous cell carcinoma of the skin. It is not possible to reliably predict which spots will progress to skin cancer and so treatment is recommended to prevent development of skin cancer.  Recommend daily broad spectrum sunscreen SPF 30+ to sun-exposed areas, reapply every 2 hours as needed.  Recommend staying in the shade or wearing long sleeves, sun glasses (UVA+UVB protection) and wide brim hats (4-inch brim around the entire circumference of the hat). Call for new or changing lesions.  Destruction of lesion - Right Forehead x1, central forehead x1 (2) Complexity: simple   Destruction method: cryotherapy   Informed consent: discussed and consent obtained   Timeout:  patient name, date of birth, surgical site, and procedure verified Lesion destroyed using liquid nitrogen: Yes   Region frozen until ice ball extended beyond lesion: Yes   Cryo cycles: 1 or 2. Outcome: patient tolerated procedure well with no complications   Post-procedure details: wound care instructions given   Additional details:  Prior to procedure, discussed risks of blister formation, small wound, skin dyspigmentation, or rare scar following cryotherapy. Recommend Vaseline ointment to treated areas while healing.    Return for TBSE, HxBCC, HxSCC in 6-12 months.  I, Lawson Radar, CMA, am acting as scribe for Elie Goody, MD.   Documentation: I have reviewed the above documentation for accuracy and completeness, and I agree with the above.  Elie Goody, MD

## 2023-11-09 NOTE — Progress Notes (Signed)
 Subjective:    Chief Complaint  Patient presents with  . Cough    X2 days, Thick phlegm, and right sided chest discomfort Advair diskus is new for her since last month, she did use this today Has pain under the right breast, denies skin color changes/rash Asthma    History was provided by the patient. Abigail Powers is a 83 y.o., female  who presents with complaints of right lung pain for 2 days, this morning just right upper lung pain.  Reports feels similar to previous bouts of pneumonia.  Patient admits to cough and congestion beginning 1 week ago.  Later reports finished antibiotic about a week ago.  Unable to determine precisely when her symptoms began.  Nonetheless, has been coughing, may have injured her right chest?  No complaints of dizziness; no sore throat; no known fever or chills.  Reports PCP called her and azithromycin  and albuterol  inhaler, which she completed? Symptoms include: above   Patient denies: above Treatment to date: above   Past Medical History:  Diagnosis Date  . Anxiety   . Arthritis    back pain, left hip   . COPD (chronic obstructive pulmonary disease) (CMS/HHS-HCC)    nonsmoker- secondhand smoke  . Hyperlipidemia   . Hypertension   . Migraine   . Osteoporosis   . RLS (restless legs syndrome) 02/03/2013  . Stomach ulcer   . Vitamin D  deficiency 04/03/2012   The following portions of the patient's history were reviewed and updated as appropriate: allergies, current medications, past social history, and problem list.  Review of Systems A complete review of systems was performed.  Positive and pertinent negative responses are documented in the HPI, and all other systems are negative.   Objective:     Vitals:   11/09/23 1017 11/09/23 1020  BP: (!) 172/63 138/72  Pulse: 66   Temp: 36.8 C (98.3 F)   SpO2: 97%   Weight: 48.5 kg (107 lb)   Height: 160 cm (5' 3)   PainSc:   4   PainLoc: Chest     General Appearance:  Well-developed,  well-nourished.  Pleasant and cooperative.  Somewhat anxious.  Vague historian. HEENT:  [place]/AT, PERRLA, EOMI, sclera clear, conjunctivae pink.   Mouth and throat: Posterior oropharynx moderate erythema, drainage; tongue midline. TM's: clear bilaterally.  Neck:  Supple, no JVD or adenopathy. Cardiovascular:  Regular rate and rhythm.  Lungs: Slightly coarse with scattered intermittent end expiratory wheezes. Abdomen:  Soft, nontender, nondistended. Normoactive bowel sounds.  Back: No CVA tenderness. Extremities:   No cyanosis, clubbing, or edema. Neuro:  Alert and orient x4; nonfocal.    Lab/X-ray/Treatments:   Extended respiratory viral panel-negative  CBC-6.7K, 72% neutrophils, 16% lymphocytes Chest x-ray-chronic changes, no discrete infiltrate       Assessment:      1. Acute bacterial sinusitis -     levoFLOXacin  (LEVAQUIN ) 500 MG tablet; Take 1 tablet (500 mg total) by mouth once daily for 10 days  Dispense: 10 tablet; Refill: 0 -     predniSONE  (DELTASONE ) 10 MG tablet; May take 1 tablet twice daily with food for 5 days, then 1 tablet once daily with food for 5 days.  Dispense: 15 tablet; Refill: 0 -     benzonatate  (TESSALON ) 200 MG capsule; Take 1 capsule (200 mg total) by mouth 3 (three) times daily as needed for Cough for up to 7 days  Dispense: 20 capsule; Refill: 0  2. Bronchitis with bronchospasm, unspecified -  levoFLOXacin  (LEVAQUIN ) 500 MG tablet; Take 1 tablet (500 mg total) by mouth once daily for 10 days  Dispense: 10 tablet; Refill: 0 -     predniSONE  (DELTASONE ) 10 MG tablet; May take 1 tablet twice daily with food for 5 days, then 1 tablet once daily with food for 5 days.  Dispense: 15 tablet; Refill: 0 -     benzonatate  (TESSALON ) 200 MG capsule; Take 1 capsule (200 mg total) by mouth 3 (three) times daily as needed for Cough for up to 7 days  Dispense: 20 capsule; Refill: 0  3. COPD exacerbation (CMS/HHS-HCC) -     levoFLOXacin  (LEVAQUIN ) 500 MG tablet; Take  1 tablet (500 mg total) by mouth once daily for 10 days  Dispense: 10 tablet; Refill: 0 -     predniSONE  (DELTASONE ) 10 MG tablet; May take 1 tablet twice daily with food for 5 days, then 1 tablet once daily with food for 5 days.  Dispense: 15 tablet; Refill: 0 -     benzonatate  (TESSALON ) 200 MG capsule; Take 1 capsule (200 mg total) by mouth 3 (three) times daily as needed for Cough for up to 7 days  Dispense: 20 capsule; Refill: 0  4. Right-sided chest wall pain -     X-ray chest PA and lateral; Future -     CBC w/auto Differential (5 Part)  5. Encntr for obs for susp expsr to oth biolg agents ruled out -     Extended Respiratory Viral Panel - [facility]  6. Acute cough -     X-ray chest PA and lateral; Future -     CBC w/auto Differential (5 Part)   Patient recently treated with azithromycin  and prednisone  without resolution.  Will retreat with Levaquin  (renal function intact), guaifenesin , prednisone , Tessalon  [place].  Continue inhaler meds as per PCP.  Fluids and rest.  Call return if any further problems or concerns.    Plan:   Requested Prescriptions   Signed Prescriptions Disp Refills  . levoFLOXacin  (LEVAQUIN ) 500 MG tablet 10 tablet 0    Sig: Take 1 tablet (500 mg total) by mouth once daily for 10 days  . predniSONE  (DELTASONE ) 10 MG tablet 15 tablet 0    Sig: May take 1 tablet twice daily with food for 5 days, then 1 tablet once daily with food for 5 days.  . benzonatate  (TESSALON ) 200 MG capsule 20 capsule 0    Sig: Take 1 capsule (200 mg total) by mouth 3 (three) times daily as needed for Cough for up to 7 days

## 2023-12-11 NOTE — Progress Notes (Signed)
 No chief complaint on file.   HPI  Abigail Powers is a 83 y.o. here for an acute issue.    She has a PMH of COPD, HTN, HLD, arthritis, RLS, osteoporosis, vitamin D  deficiency, anxiety who underwent lumbar surgery on 03/14/2023.  Today presents with pain to the left lateral hip, worse when lying on that side at night.  History of bursitis.  Has had cortisone injections with benefit in the past.    ROS  Pertinent items are noted in HPI.  Outpatient Encounter Medications as of 12/11/2023  Medication Sig Dispense Refill  . ascorbic acid, vitamin C, (VITAMIN C) 1000 MG tablet Take 1,000 mg by mouth once daily    . atorvastatin  (LIPITOR) 20 MG tablet TAKE 1 TABLET BY MOUTH ONCE  DAILY 100 tablet 2  . cetirizine (ZYRTEC) 10 MG tablet Take 10 mg by mouth once daily    . cholecalciferol  (VITAMIN D3) 1000 unit capsule Take by mouth once daily    . clonazePAM  (KLONOPIN ) 0.5 MG tablet Take 1 tablet (0.5 mg total) by mouth at bedtime as needed for Anxiety for up to 30 days 30 tablet 0  . dicyclomine  (BENTYL ) 10 mg capsule TAKE 1 CAPSULE BY MOUTH 3 TIMES  DAILY 270 capsule 3  . docusate (COLACE) 100 MG capsule Take 100 mg by mouth once daily    . donepeziL (ARICEPT) 5 MG tablet Take 1 tablet (5 mg total) by mouth every morning 90 tablet 3  . etodolac (LODINE) 400 MG tablet Take 1 tablet (400 mg total) by mouth once daily as needed (Back pain) 90 tablet 3  . fluticasone  propion-salmeteroL (ADVAIR DISKUS) 100-50 mcg/dose diskus inhaler Inhale 1 Puff into the lungs every 12 (twelve) hours 1 each 11  . fluticasone  propionate (FLONASE ) 50 mcg/actuation nasal spray USE 2 SPRAYS IN BOTH NOSTRILS  ONCE DAILY 48 g 6  . SUMAtriptan  (IMITREX ) 50 MG tablet TAKE 1 TABLET BY MOUTH AS  DIRECTED FOR MIGRAINE MAY TAKE A SECOND DOSE AFTER 2 HOURS IF  NEEDED 40 tablet 0  . SYMBICORT 160-4.5 mcg/actuation inhaler USE 2 INHALATIONS BY MOUTH TWICE DAILY 30.6 g 3  . traZODone  (DESYREL ) 50 MG tablet TAKE 1 TABLET BY  MOUTH AT NIGHT 90 tablet 3  . predniSONE  (DELTASONE ) 10 MG tablet May take 1 tablet twice daily with food for 5 days, then 1 tablet once daily with food for 5 days. (Patient not taking: Reported on 12/11/2023) 15 tablet 0   No facility-administered encounter medications on file as of 12/11/2023.    Allergies as of 12/11/2023 - Reviewed 11/09/2023  Allergen Reaction Noted  . Penicillins Rash 04/03/2012    Past Medical History:  Diagnosis Date  . Anxiety   . Arthritis    back pain, left hip   . COPD (chronic obstructive pulmonary disease) (CMS/HHS-HCC)    nonsmoker- secondhand smoke  . Hyperlipidemia   . Hypertension   . Migraine   . Osteoporosis   . RLS (restless legs syndrome) 02/03/2013  . Stomach ulcer   . Vitamin D  deficiency 04/03/2012    Past Surgical History:  Procedure Laterality Date  . thumb ganglion cyst excision Left 12/31/2015  . APPENDECTOMY    . bunionectomy     right foot.  . CHOLECYSTECTOMY    . ENDOSCOPIC CARPAL TUNNEL RELEASE     bilat  . HYSTERECTOMY     complete, no CA    Vitals:   12/11/23 1112  BP: 130/60  Pulse: 72  Physical Exam  General. Well appearing; NAD; VS reviewed     HEENT: Sclera and conjunctiva clear; EOMI,  Lungs. Respirations unlabored;  Msk: Tender over the left greater trochanter. Extremities:  No edema. Skin. Normal color and turgor Neurologic. Alert and oriented x3   Assessment and Plan 1. Greater trochanteric bursitis of left hip After verbal informed consent obtained, 1.0cc lidocaine , and 1.0cc Kenalog  were injected under sterile conditions into the left  greater trochanter bursa  without complication.  -     triamcinolone  acetonide (KENALOG -40) 40 mg injection    I have personally performed this service.  [address] [place], [place]

## 2024-01-17 NOTE — Progress Notes (Signed)
 Abigail Powers is a 83 y.o. female  CHIEF COMPLAINT: Chief Complaint  Patient presents with  . Knee Pain    Reports she fell 3 weeks ago. She had a laceration left knee. Notes some redness.   Today, she notices pain in her left thigh.     SUBJECTIVE: Patient skinned her left knee several weeks ago, healing fairly well.  Did have some left inner thigh neuritis but that is better ______________________________________________________________________ Current Outpatient Medications  Medication Sig Dispense Refill  . ascorbic acid, vitamin C, (VITAMIN C) 1000 MG tablet Take 1,000 mg by mouth once daily    . atorvastatin  (LIPITOR) 20 MG tablet TAKE 1 TABLET BY MOUTH ONCE  DAILY 100 tablet 2  . cetirizine (ZYRTEC) 10 MG tablet Take 10 mg by mouth once daily    . cholecalciferol  (VITAMIN D3) 1000 unit capsule Take by mouth once daily    . clonazePAM  (KLONOPIN ) 0.5 MG tablet Take 1 tablet (0.5 mg total) by mouth once daily as needed for Anxiety for up to 180 days 30 tablet 5  . dicyclomine  (BENTYL ) 10 mg capsule TAKE 1 CAPSULE BY MOUTH 3 TIMES  DAILY 270 capsule 3  . docusate (COLACE) 100 MG capsule Take 100 mg by mouth once daily    . donepeziL (ARICEPT) 5 MG tablet Take 1 tablet (5 mg total) by mouth every morning 90 tablet 3  . etodolac (LODINE) 400 MG tablet Take 1 tablet (400 mg total) by mouth once daily as needed (Back pain) 90 tablet 3  . fluticasone  propion-salmeteroL (ADVAIR DISKUS) 100-50 mcg/dose diskus inhaler Inhale 1 Puff into the lungs every 12 (twelve) hours 1 each 11  . fluticasone  propionate (FLONASE ) 50 mcg/actuation nasal spray USE 2 SPRAYS IN BOTH NOSTRILS  ONCE DAILY 48 g 6  . SUMAtriptan  (IMITREX ) 50 MG tablet TAKE 1 TABLET BY MOUTH AS  DIRECTED FOR MIGRAINE MAY TAKE A SECOND DOSE AFTER 2 HOURS IF  NEEDED 40 tablet 0  . SYMBICORT 160-4.5 mcg/actuation inhaler USE 2 INHALATIONS BY MOUTH TWICE DAILY 30.6 g 3  . traZODone  (DESYREL ) 50 MG tablet TAKE 1 TABLET BY MOUTH AT  NIGHT 90 tablet 3   No current facility-administered medications for this visit.    ALLERGIES: Penicillins  Past Medical History:  Diagnosis Date  . Anxiety   . Arthritis    back pain, left hip   . COPD (chronic obstructive pulmonary disease) (CMS/HHS-HCC)    nonsmoker- secondhand smoke  . Hyperlipidemia   . Hypertension   . Migraine   . Osteoporosis   . RLS (restless legs syndrome) 02/03/2013  . Stomach ulcer   . Vitamin D  deficiency 04/03/2012    Past Surgical History:  Procedure Laterality Date  . thumb ganglion cyst excision Left 12/31/2015  . APPENDECTOMY    . bunionectomy     right foot.  . CHOLECYSTECTOMY    . ENDOSCOPIC CARPAL TUNNEL RELEASE     bilat  . HYSTERECTOMY     complete, no CA    PHYSICAL EXAM:           BP 120/66   Pulse 70   Wt 48.6 kg (107 lb 3.2 oz)   LMP  (LMP Unknown)   SpO2 96%   BMI 18.99 kg/m  Body mass index is 18.99 kg/m.  Wt Readings from Last 3 Encounters:  01/17/24 48.6 kg (107 lb 3.2 oz)  12/11/23 50.8 kg (112 lb)  11/09/23 48.5 kg (107 lb)    BP Readings from Last 3  Encounters:  01/17/24 120/66  12/11/23 130/60  11/09/23 138/72    Constitutional:NAD Neck: supple, no thyromegaly, good ROM Left knee-healing skin tear over patella, no erythema    ASSESSMENT/PLAN:   Skin tear-overall looks good, will put Vaseline on it and cover with a Band-Aid, no sign for infection  No follow-ups on file.

## 2024-04-04 ENCOUNTER — Inpatient Hospital Stay
Admission: EM | Admit: 2024-04-04 | Discharge: 2024-04-07 | DRG: 202 | Disposition: A | Discharge status: Home Health | Attending: Student | Admitting: Student

## 2024-04-04 ENCOUNTER — Other Ambulatory Visit: Payer: Self-pay

## 2024-04-04 ENCOUNTER — Emergency Department

## 2024-04-04 DIAGNOSIS — F02811 Dementia in other diseases classified elsewhere, unspecified severity, with agitation: Secondary | ICD-10-CM | POA: Diagnosis present

## 2024-04-04 DIAGNOSIS — R269 Unspecified abnormalities of gait and mobility: Secondary | ICD-10-CM | POA: Diagnosis present

## 2024-04-04 DIAGNOSIS — R296 Repeated falls: Secondary | ICD-10-CM | POA: Diagnosis present

## 2024-04-04 DIAGNOSIS — G2581 Restless legs syndrome: Secondary | ICD-10-CM | POA: Diagnosis present

## 2024-04-04 DIAGNOSIS — J209 Acute bronchitis, unspecified: Principal | ICD-10-CM | POA: Diagnosis present

## 2024-04-04 DIAGNOSIS — Z8616 Personal history of COVID-19: Secondary | ICD-10-CM | POA: Diagnosis not present

## 2024-04-04 DIAGNOSIS — F0284 Dementia in other diseases classified elsewhere, unspecified severity, with anxiety: Secondary | ICD-10-CM | POA: Diagnosis present

## 2024-04-04 DIAGNOSIS — I10 Essential (primary) hypertension: Secondary | ICD-10-CM | POA: Diagnosis present

## 2024-04-04 DIAGNOSIS — J440 Chronic obstructive pulmonary disease with acute lower respiratory infection: Secondary | ICD-10-CM | POA: Diagnosis present

## 2024-04-04 DIAGNOSIS — E785 Hyperlipidemia, unspecified: Secondary | ICD-10-CM | POA: Diagnosis present

## 2024-04-04 DIAGNOSIS — Z85828 Personal history of other malignant neoplasm of skin: Secondary | ICD-10-CM | POA: Diagnosis not present

## 2024-04-04 DIAGNOSIS — W19XXXA Unspecified fall, initial encounter: Secondary | ICD-10-CM | POA: Diagnosis present

## 2024-04-04 DIAGNOSIS — F05 Delirium due to known physiological condition: Secondary | ICD-10-CM | POA: Diagnosis present

## 2024-04-04 DIAGNOSIS — Z7951 Long term (current) use of inhaled steroids: Secondary | ICD-10-CM | POA: Diagnosis not present

## 2024-04-04 DIAGNOSIS — Z8249 Family history of ischemic heart disease and other diseases of the circulatory system: Secondary | ICD-10-CM

## 2024-04-04 DIAGNOSIS — Z79899 Other long term (current) drug therapy: Secondary | ICD-10-CM

## 2024-04-04 DIAGNOSIS — Z1152 Encounter for screening for COVID-19: Secondary | ICD-10-CM

## 2024-04-04 DIAGNOSIS — K7460 Unspecified cirrhosis of liver: Secondary | ICD-10-CM | POA: Diagnosis present

## 2024-04-04 DIAGNOSIS — J189 Pneumonia, unspecified organism: Secondary | ICD-10-CM | POA: Diagnosis present

## 2024-04-04 DIAGNOSIS — Z90710 Acquired absence of both cervix and uterus: Secondary | ICD-10-CM

## 2024-04-04 DIAGNOSIS — Z880 Allergy status to penicillin: Secondary | ICD-10-CM | POA: Diagnosis not present

## 2024-04-04 DIAGNOSIS — K219 Gastro-esophageal reflux disease without esophagitis: Secondary | ICD-10-CM | POA: Diagnosis present

## 2024-04-04 DIAGNOSIS — R059 Cough, unspecified: Secondary | ICD-10-CM | POA: Diagnosis present

## 2024-04-04 DIAGNOSIS — R4189 Other symptoms and signs involving cognitive functions and awareness: Secondary | ICD-10-CM | POA: Diagnosis present

## 2024-04-04 DIAGNOSIS — G3183 Dementia with Lewy bodies: Secondary | ICD-10-CM | POA: Diagnosis present

## 2024-04-04 DIAGNOSIS — R54 Age-related physical debility: Secondary | ICD-10-CM | POA: Diagnosis present

## 2024-04-04 DIAGNOSIS — M25551 Pain in right hip: Secondary | ICD-10-CM | POA: Diagnosis present

## 2024-04-04 DIAGNOSIS — Z8701 Personal history of pneumonia (recurrent): Secondary | ICD-10-CM

## 2024-04-04 DIAGNOSIS — Z9049 Acquired absence of other specified parts of digestive tract: Secondary | ICD-10-CM

## 2024-04-04 LAB — CBC WITH DIFFERENTIAL/PLATELET
Abs Immature Granulocytes: 0.02 K/uL (ref 0.00–0.07)
Basophils Absolute: 0 K/uL (ref 0.0–0.1)
Basophils Relative: 0 %
Eosinophils Absolute: 0.1 K/uL (ref 0.0–0.5)
Eosinophils Relative: 1 %
HCT: 37.3 % (ref 36.0–46.0)
Hemoglobin: 12.2 g/dL (ref 12.0–15.0)
Immature Granulocytes: 0 %
Lymphocytes Relative: 12 %
Lymphs Abs: 1.1 K/uL (ref 0.7–4.0)
MCH: 31.4 pg (ref 26.0–34.0)
MCHC: 32.7 g/dL (ref 30.0–36.0)
MCV: 95.9 fL (ref 80.0–100.0)
Monocytes Absolute: 1.1 K/uL — ABNORMAL HIGH (ref 0.1–1.0)
Monocytes Relative: 13 %
Neutro Abs: 6.6 K/uL (ref 1.7–7.7)
Neutrophils Relative %: 74 %
Platelets: 232 K/uL (ref 150–400)
RBC: 3.89 MIL/uL (ref 3.87–5.11)
RDW: 13.1 % (ref 11.5–15.5)
WBC: 8.8 K/uL (ref 4.0–10.5)
nRBC: 0 % (ref 0.0–0.2)

## 2024-04-04 LAB — URINALYSIS, W/ REFLEX TO CULTURE (INFECTION SUSPECTED)
Bilirubin Urine: NEGATIVE
Glucose, UA: NEGATIVE mg/dL
Ketones, ur: NEGATIVE mg/dL
Nitrite: NEGATIVE
Protein, ur: NEGATIVE mg/dL
Specific Gravity, Urine: 1.01 (ref 1.005–1.030)
pH: 7 (ref 5.0–8.0)

## 2024-04-04 LAB — COMPREHENSIVE METABOLIC PANEL WITH GFR
ALT: 19 U/L (ref 0–44)
AST: 29 U/L (ref 15–41)
Albumin: 3.5 g/dL (ref 3.5–5.0)
Alkaline Phosphatase: 55 U/L (ref 38–126)
Anion gap: 9 (ref 5–15)
BUN: 11 mg/dL (ref 8–23)
CO2: 26 mmol/L (ref 22–32)
Calcium: 8.9 mg/dL (ref 8.9–10.3)
Chloride: 105 mmol/L (ref 98–111)
Creatinine, Ser: 0.65 mg/dL (ref 0.44–1.00)
GFR, Estimated: >60 mL/min (ref >60)
Glucose, Bld: 113 mg/dL — ABNORMAL HIGH (ref 70–99)
Potassium: 3.8 mmol/L (ref 3.5–5.1)
Sodium: 140 mmol/L (ref 135–145)
Total Bilirubin: 1.7 mg/dL — ABNORMAL HIGH (ref 0.0–1.2)
Total Protein: 6.4 g/dL — ABNORMAL LOW (ref 6.5–8.1)

## 2024-04-04 LAB — LACTIC ACID, PLASMA
Lactic Acid, Venous: 1.3 mmol/L (ref 0.5–1.9)
Lactic Acid, Venous: 1.1 mmol/L (ref 0.5–1.9)

## 2024-04-04 LAB — RESP PANEL BY RT-PCR (RSV, FLU A&B, COVID)  RVPGX2
Influenza A by PCR: NEGATIVE
Influenza B by PCR: NEGATIVE
Resp Syncytial Virus by PCR: NEGATIVE
SARS Coronavirus 2 by RT PCR: NEGATIVE

## 2024-04-04 MED ORDER — SODIUM CHLORIDE 0.9 % IV SOLN
1.0000 g | Freq: Once | INTRAVENOUS | Status: AC
  Administered 2024-04-04: 1 g via INTRAVENOUS
  Filled 2024-04-04: qty 10

## 2024-04-04 MED ORDER — TRAZODONE HCL 50 MG PO TABS
50.0000 mg | ORAL_TABLET | Freq: Every day | ORAL | Status: DC
  Administered 2024-04-04 – 2024-04-06 (×3): 50 mg via ORAL
  Filled 2024-04-04 (×3): qty 1

## 2024-04-04 MED ORDER — CLONAZEPAM 0.5 MG PO TABS: 0.5000 mg | ORAL_TABLET | Freq: Every day | ORAL | Status: DC | PRN

## 2024-04-04 MED ORDER — SODIUM CHLORIDE 0.9 % IV SOLN
500.0000 mg | Freq: Once | INTRAVENOUS | Status: AC
  Administered 2024-04-04: 500 mg via INTRAVENOUS
  Filled 2024-04-04: qty 5

## 2024-04-04 MED ORDER — FLUTICASONE FUROATE-VILANTEROL 200-25 MCG/ACT IN AEPB
1.0000 | INHALATION_SPRAY | Freq: Every day | RESPIRATORY_TRACT | Status: DC
  Administered 2024-04-04 – 2024-04-07 (×3): 1 via RESPIRATORY_TRACT
  Filled 2024-04-04: qty 28

## 2024-04-04 MED ORDER — SODIUM CHLORIDE 0.9 % IV SOLN
500.0000 mg | INTRAVENOUS | Status: DC
  Administered 2024-04-05 – 2024-04-06 (×2): 500 mg via INTRAVENOUS
  Filled 2024-04-04 (×3): qty 5

## 2024-04-04 MED ORDER — VITAMIN D 25 MCG (1000 UNIT) PO TABS
2000.0000 [IU] | ORAL_TABLET | Freq: Every day | ORAL | Status: DC
  Administered 2024-04-05 – 2024-04-07 (×3): 2000 [IU] via ORAL
  Filled 2024-04-04 (×3): qty 2

## 2024-04-04 MED ORDER — ENOXAPARIN SODIUM 40 MG/0.4ML IJ SOSY
40.0000 mg | PREFILLED_SYRINGE | INTRAMUSCULAR | Status: DC
  Administered 2024-04-04 – 2024-04-06 (×3): 40 mg via SUBCUTANEOUS
  Filled 2024-04-04 (×3): qty 0.4

## 2024-04-04 MED ORDER — ALBUTEROL SULFATE (2.5 MG/3ML) 0.083% IN NEBU: 2.5000 mg | INHALATION_SOLUTION | Freq: Four times a day (QID) | RESPIRATORY_TRACT | Status: DC | PRN

## 2024-04-04 MED ORDER — GUAIFENESIN 100 MG/5ML PO LIQD: 200.0000 mg | ORAL | Status: DC | PRN

## 2024-04-04 MED ORDER — ATORVASTATIN CALCIUM 20 MG PO TABS
20.0000 mg | ORAL_TABLET | Freq: Every day | ORAL | Status: DC
  Administered 2024-04-04 – 2024-04-06 (×3): 20 mg via ORAL
  Filled 2024-04-04 (×3): qty 1

## 2024-04-04 MED ORDER — BENZONATATE 100 MG PO CAPS
200.0000 mg | ORAL_CAPSULE | Freq: Three times a day (TID) | ORAL | Status: DC
  Administered 2024-04-04 – 2024-04-07 (×8): 200 mg via ORAL
  Filled 2024-04-04 (×8): qty 2

## 2024-04-04 MED ORDER — DOCUSATE SODIUM 100 MG PO CAPS
100.0000 mg | ORAL_CAPSULE | Freq: Every day | ORAL | Status: DC
  Administered 2024-04-05 – 2024-04-07 (×3): 100 mg via ORAL
  Filled 2024-04-04 (×3): qty 1

## 2024-04-04 MED ORDER — SODIUM CHLORIDE 0.9 % IV SOLN
2.0000 g | INTRAVENOUS | Status: DC
  Administered 2024-04-05 – 2024-04-06 (×2): 2 g via INTRAVENOUS
  Filled 2024-04-04 (×3): qty 20

## 2024-04-04 MED ORDER — DICYCLOMINE HCL 10 MG PO CAPS: 10.0000 mg | ORAL_CAPSULE | Freq: Every day | ORAL | Status: DC

## 2024-04-04 MED ORDER — MAGNESIUM OXIDE -MG SUPPLEMENT 400 (240 MG) MG PO TABS
400.0000 mg | ORAL_TABLET | Freq: Every day | ORAL | Status: DC
  Administered 2024-04-05 – 2024-04-07 (×3): 400 mg via ORAL
  Filled 2024-04-04 (×3): qty 1

## 2024-04-04 MED ORDER — PREDNISONE 20 MG PO TABS
20.0000 mg | ORAL_TABLET | Freq: Every day | ORAL | Status: DC
  Administered 2024-04-04: 20 mg via ORAL
  Filled 2024-04-04: qty 1

## 2024-04-04 MED ORDER — FLUTICASONE PROPIONATE 50 MCG/ACT NA SUSP
2.0000 | Freq: Every day | NASAL | Status: DC
  Administered 2024-04-05 – 2024-04-07 (×3): 2 via NASAL
  Filled 2024-04-04: qty 16

## 2024-04-04 NOTE — ED Notes (Signed)
 Patient up to toilet x2 without incident. Family at the bedside.

## 2024-04-04 NOTE — ED Triage Notes (Signed)
 Pt to ED POV with husband from home for frequent falls last several days. Has fallen 3-4 times each day during this time. Legs give out on her and then she cannot get herself up. States has also had green phlegm and cough for last few days. Denies urinary symptoms. Has bruises to arms and R hip is sore but denies pain. Hx mild dementia, alert and oriented at this time.

## 2024-04-04 NOTE — ED Provider Notes (Signed)
 [facility] Provider Note    Event Date/Time   First MD Initiated Contact with Patient 04/04/24 1803     (approximate)   History   Fall   HPI  Abigail Powers is a 83 y.o. female who presents to the emergency department today because of concerns for frequent falls.  Family states that over the past few months she has had increasing falls.  However over the past 4 days she has had more frequent falls.  Falling up to 5 times a day.  She does complain of some slight right hip pain after the falls.  Denies any significant head injury.  Family describe it as if she looks drunk.  The patient also is starting to show signs of dementia per the family.  Patient complains of cough for the past 4 days.  It is productive of green sputum. No fevers. No dysuria.      Physical Exam   Triage Vital Signs: ED Triage Vitals  Encounter Vitals Group     BP 04/04/24 1631 (!) 132/57     Girls Systolic BP Percentile --      Girls Diastolic BP Percentile --      Boys Systolic BP Percentile --      Boys Diastolic BP Percentile --      Pulse Rate 04/04/24 1631 93     Resp 04/04/24 1631 20     Temp 04/04/24 1631 98.6 F (37 C)     Temp Source 04/04/24 1631 Oral     SpO2 04/04/24 1631 97 %     Weight 04/04/24 1633 105 lb (47.6 kg)     Height 04/04/24 1633 5' 3 (1.6 m)     Head Circumference --      Peak Flow --      Pain Score 04/04/24 1629 0     Pain Loc --      Pain Education --      Exclude from Growth Chart --     Most recent vital signs: Vitals:   04/04/24 1631  BP: (!) 132/57  Pulse: 93  Resp: 20  Temp: 98.6 F (37 C)  SpO2: 97%   General: Awake, alert, not completely oriented. CV:  Good peripheral perfusion. Regular rate and rhythm. Resp:  Normal effort. Slight rhonchi noted to right lower lung. Abd:  No distention. Non tender.  ED Results / Procedures / Treatments   Labs (all labs ordered are listed, but only abnormal results are  displayed) Labs Reviewed  COMPREHENSIVE METABOLIC PANEL WITH GFR - Abnormal; Notable for the following components:      Result Value   Glucose, Bld 113 (*)    Total Protein 6.4 (*)    Total Bilirubin 1.7 (*)    All other components within normal limits  CBC WITH DIFFERENTIAL/PLATELET - Abnormal; Notable for the following components:   Monocytes Absolute 1.1 (*)    All other components within normal limits  URINALYSIS, W/ REFLEX TO CULTURE (INFECTION SUSPECTED) - Abnormal; Notable for the following components:   Color, Urine YELLOW (*)    APPearance CLEAR (*)    Hgb urine dipstick SMALL (*)    Leukocytes,Ua MODERATE (*)    Bacteria, UA RARE (*)    All other components within normal limits     EKG  I, Guadalupe Eagles, attending physician, personally viewed and interpreted this EKG  EKG Time: 1814 Rate: 71 Rhythm: sinus rhythm Axis: right axis deviation Intervals: qtc 405 QRS: narrow ST  changes: no st elevation Impression: abnormal ekg   RADIOLOGY I independently interpreted and visualized the CXR. My interpretation: No pneumonia Radiology interpretation:  IMPRESSION:  Patchy nodular-like airspace opacities along mid lower lung zones  the right.    Followup PA and lateral chest X-ray is recommended in 3-4 weeks  following therapy to ensure resolution and exclude underlying  malignancy.    I independently interpreted and visualized the right hip. My interpretation: NO acute osseous abnormality Radiology interpretation:  IMPRESSION:  Negative for acute traumatic injury.      PROCEDURES:  Critical Care performed: No    MEDICATIONS ORDERED IN ED: Medications - No data to display   IMPRESSION / MDM / ASSESSMENT AND PLAN / ED COURSE  I reviewed the triage vital signs and the nursing notes.                              Differential diagnosis includes, but is not limited to, ICH, anemia, electrolyte abnormality, infection  Patient's presentation is most  consistent with acute presentation with potential threat to life or bodily function.   Patient presented to the emergency department today because of concerns for increasing falls.  Patient also complaining about productive cough.  On exam patient is awake although not completely oriented.  No findings concerning for significant traumatic injury.  Did have right hip x-ray performed from triage which did not show any acute abnormality.  Chest x-ray however is concerning for possible pneumonia.  Will start IV antibiotics.  Given significant weakness and increased falls do think patient would benefit from further workup and management.  Discussed with Dr. Marca with the hospitalist service who will evaluate for admission.   FINAL CLINICAL IMPRESSION(S) / ED DIAGNOSES   Final diagnoses:  Pneumonia due to infectious organism, unspecified laterality, unspecified part of lung  Falls     Note:  This document was prepared using Dragon voice recognition software and may include unintentional dictation errors.    Floy Roberts, MD 04/04/24 2117

## 2024-04-04 NOTE — H&P (Signed)
 History and Physical    Patient: Abigail Powers FMW:[id number] DOB: Aug 26, 1941 DOA: 04/04/2024 DOS: the patient was seen and examined on 04/04/2024 PCP: Cleotilde Oneil FALCON, MD  Patient coming from: Home  Chief Complaint:  Chief Complaint  Patient presents with   Fall   HPI: Abigail Powers is a 83 y.o. female with medical history significant of COPD, Recurrent pneumonia, Hyperlipidemia,HTN,RLS, anxiety disorder, s/p Lumbar surgery in 09/2023, Greater trochanteric bursitis s/p Steroid injection 01/2024 who presents with family on account of increased falls at home over the past few days. Patient has notably been having increased cognitive impairment and fall over the past several months but this has increased during the past few days. Family could not figure out the obvious cause but reported increased coughing spells productive of yellowish sputum. Sick contact likely from Husband, who was apparently sick recently. She denies any associated fever or chills. For her gait disturbance, daughter describes shuffling gait but no reported resting tremors.   Review of Systems: As mentioned in the history of present illness. All other systems reviewed and are negative. Past Medical History:  Diagnosis Date   ADHD (attention deficit hyperactivity disorder)    Arthritis    Asthma    Basal cell carcinoma 08/29/2022   Mid upper chest. Superficial. 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   left nasal sidewall extending to eyelid / mohs surgery complete 08/24/20    Hyperlipidemia    Pneumonia    x several   Wears hearing aid in both ears    only wears right  ear 03/08/23   Past Surgical History:  Procedure Laterality Date   ABDOMINAL HYSTERECTOMY     APPENDECTOMY     CATARACT EXTRACTION W/PHACO Right 11/23/2021   Procedure: CATARACT EXTRACTION PHACO AND INTRAOCULAR LENS PLACEMENT (IOC)RIGHT;  Surgeon: Mittie Gaskin, MD;  Location: [facility];  Service: Ophthalmology;  Laterality: Right;  7.34 01:06.9   CATARACT EXTRACTION W/PHACO Left 12/07/2021   Procedure: CATARACT EXTRACTION PHACO AND INTRAOCULAR LENS PLACEMENT (IOC) LEFT 5.80 01:01.9;  Surgeon: Mittie Gaskin, MD;  Location: [facility];  Service: Ophthalmology;  Laterality: Left;   CHOLECYSTECTOMY     COLONOSCOPY     ESOPHAGOGASTRODUODENOSCOPY (EGD) WITH PROPOFOL  N/A 08/31/2016   Procedure: ESOPHAGOGASTRODUODENOSCOPY (EGD) WITH PROPOFOL ;  Surgeon: Ruel Kung, MD;  Location: [facility];  Service: Endoscopy;  Laterality: N/A;   LUMBAR LAMINECTOMY/DECOMPRESSION MICRODISCECTOMY Bilateral 03/14/2023   Procedure: BILATERAL LUMBAR FOUR-FIVE, LUMBAR FIVE-SACRAL ONE LAMINOTOMY AND FORAMINOTOMY;  Surgeon: Mavis Purchase, MD;  Location: [facility] OR;  Service: Neurosurgery;  Laterality: Bilateral;  3C   Social History:  reports that she has never smoked. She has never used smokeless tobacco. She reports that she does not drink alcohol and does not use drugs.  Allergies  Allergen Reactions   Penicillins Rash    Family History  Problem Relation Age of Onset   Heart disease Father    Breast cancer Neg Hx     Prior to Admission medications   Medication Sig Start Date End Date Taking? Authorizing Provider  Apoaequorin (PREVAGEN PO) Take 1  capsule by mouth daily.    [provider]  atorvastatin  (LIPITOR) 20 MG tablet Take 20 mg by mouth at bedtime. 01/28/20   [provider]  budesonide-formoterol  (SYMBICORT) 160-4.5 MCG/ACT inhaler Inhale 2 puffs into the lungs 2 (two) times daily.    [provider]  Cholecalciferol  (VITAMIN D -3 PO) Take  2,000 Units by mouth daily.    [provider]  clobetasol  ointment (TEMOVATE ) 0.05 % Apply 1 application topically 2 (two) times daily. For up to 2 weeks as needed for itch. Avoid applying to face, groin, and axilla. Use as directed. Risk of skin atrophy with long-term use reviewed. Patient not taking: Reported on 03/05/2023 05/19/21   Moye, [place] , MD  clonazePAM  (KLONOPIN ) 0.5 MG tablet Take 0.5 mg by mouth daily as needed for anxiety.    [provider]  diclofenac Sodium (VOLTAREN) 1 % GEL Apply 2 g topically at bedtime.    [provider]  dicyclomine  (BENTYL ) 10 MG capsule TAKE (1) CAPSULE BY MOUTH FOUR TIMES A DAY BEFORE MEALS AND AT BEDTIME Patient taking differently: Take 10 mg by mouth daily. 11/11/18   Therisa Bi, MD  docusate sodium  (COLACE) 100 MG capsule Take 100 mg by mouth daily.    [provider]  fluticasone  (FLONASE ) 50 MCG/ACT nasal spray Place 2 sprays into both nostrils daily.    [provider]  gentamicin  cream (GARAMYCIN ) 0.1 % Apply 1 application topically 2 (two) times daily. Patient not taking: Reported on 03/05/2023 09/23/19   Janit Thresa HERO, DPM  HYDROcodone -acetaminophen  (NORCO/VICODIN) 5-325 MG tablet Take 1-2 tablets by mouth every 4 (four) hours as needed for moderate pain. 03/15/23   Mavis Purchase, MD  magnesium  oxide (MAG-OX) 400 MG tablet Take 400 mg by mouth daily.    [provider]  SUMAtriptan  (IMITREX ) 50 MG tablet Take 25 mg by mouth every 2 (two) hours as needed for migraine. May repeat in 2 hours if headache persists or recurs.    [provider]  traZODone  (DESYREL ) 50 MG tablet Take 50 mg by mouth at bedtime.    [provider]  VENTOLIN  HFA 108 (90 Base) MCG/ACT inhaler Inhale 1-2 puffs into the lungs every 6 (six) hours as needed for wheezing or shortness of breath. 05/29/19   [provider]    Physical Exam: Vitals:   04/04/24 1631 04/04/24 1633 04/04/24 1930  04/04/24 2000  BP: (!) 132/57  109/84 126/61  Pulse: 93  81 80  Resp: 20  17 (!) 21  Temp: 98.6 F (37 C)     TempSrc: Oral     SpO2: 97%  98% 100%  Weight:  47.6 kg    Height:  5' 3 (1.6 m)     General Appearance:  Frail , elderly, well-nourished, cooperative.She is anxious and  Vague historian.Family at bedside with patient HEENT: PERRLA, sclera clear, no pale or icteric Neck:  Supple, no JVD or adenopathy. Cardiovascular:  Regular rate and rhythm.  Lungs: Diminished breaths sounds, No expiratory wheezes appreciated. Abdomen:  Soft, nontender, nondistended. Normoactive bowel sounds.  Back: No CVA tenderness. Extremities:   No cyanosis, clubbing, or edema. Neuro:  Anxious, but Alert and orient x4; nonfocal. Data Reviewed: Sodium is 140, potassium 3.8, chloride 105, bicarb 26, BUN 11, creatinine 0.65 total bilirubin 1.7, AST 29, ALT 19, lactic acid 1.1 WBC 8.8, hemoglobin was 12.2, hematocrit 37.3, platelet count 232 neutrophil count 74, flu and COVID-negative.  Respiratory panel pending.  CT head without any acute  intracranial abnormality.  Chronic microvascular changes noted.  Chest x-ray shows no evidence of hip fracture or dislocation of the right hip.  Assessment and Plan: 83 y/o female with history of COPD, HTN, HLD, worsening cognitive impairment with associated increased falls. She presents on account of cough productive of greenish sputum and increased falls. CXR concerning for patchy nodular opacities involving right middle lobe concerning for Community acquired pneumonia.  Community Acquired Pneumonia: Right middle Lobe Sputum grams stain and Blood cultures pending Resp panel ordered and pending Treatment initiated with antibiotics  Acute Bronchitis with bronchospasm, unspecified Underlying COPD, thought to be unrelated to tobacco smoking Patient will be treated with empiric antibiotics and predniSONE  (DELTASONE ) 20 MG daily for 5 days. Antitussives with  guaifenesin ,benzonatate     Worsening cognitive impairment and increased falls Concerning for dementia Differentials may include Lewy Body dementia Send TSH, Vit B12 Agitation protocol will be ordered PRN  Increased falls Check for orthostasis PT/OT to assess and advise on post discharge treatment  Anxiety Disorder PRN Benzodiazepines   Advance Care Planning:   Code Status: Full Code   Consults: None  Family Communication: Discussed plan of care with patients husband and daughter  Severity of Illness: The appropriate patient status for this patient is INPATIENT. Inpatient status is judged to be reasonable and necessary in order to provide the required intensity of service to ensure the patient's safety. The patient's presenting symptoms, physical exam findings, and initial radiographic and laboratory data in the context of their chronic comorbidities is felt to place them at high risk for further clinical deterioration. Furthermore, it is not anticipated that the patient will be medically stable for discharge from the hospital within 2 midnights of admission.   * I certify that at the point of admission it is my clinical judgment that the patient will require inpatient hospital care spanning beyond 2 midnights from the point of admission due to high intensity of service, high risk for further deterioration and high frequency of surveillance required.*  Author: Maude MARLA Dart, MD 04/04/2024 9:28 PM  For on call review [email or web address].

## 2024-04-04 NOTE — H&P (Incomplete Revision)
 History and Physical    Patient: Abigail Powers FMW:[id number] DOB: 12/09/1940 DOA: 04/04/2024 DOS: the patient was seen and examined on 04/04/2024 PCP: Cleotilde Oneil FALCON, MD  Patient coming from: Home  Chief Complaint:  Chief Complaint  Patient presents with   Fall   HPI: Abigail Powers is a 83 y.o. female with medical history significant of COPD, Recurrent pneumonia, Hyperlipidemia,HTN,RLS, anxiety disorder, s/p Lumbar surgery in 09/2023, Greater trochanteric bursitis s/p Steroid injection 01/2024 who presents with family on account of increased falls at home over the past few days. Patient has notably been having increased cognitive impairment and fall over the past several months but this has increased during the past few days. Family could not figure out the obvious cause but reported increased coughing spells productive of yellowish sputum. Sick contact likely from Husband, who was apparently sick recently. She denies any associated fever or chills. For her gait disturbance, daughter describes shuffling gait but no reported resting tremors.   Review of Systems: As mentioned in the history of present illness. All other systems reviewed and are negative. Past Medical History:  Diagnosis Date   ADHD (attention deficit hyperactivity disorder)    Arthritis    Asthma    Basal cell carcinoma 08/29/2022   Mid upper chest. Superficial. 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   left nasal sidewall extending to eyelid / mohs surgery complete 08/24/20    Hyperlipidemia    Pneumonia    x several   Wears hearing aid in both ears    only wears right  ear 03/08/23   Past Surgical History:  Procedure Laterality Date   ABDOMINAL HYSTERECTOMY     APPENDECTOMY     CATARACT EXTRACTION W/PHACO Right 11/23/2021   Procedure: CATARACT EXTRACTION PHACO AND INTRAOCULAR LENS PLACEMENT (IOC)RIGHT;  Surgeon: Mittie Gaskin, MD;  Location: [facility];  Service: Ophthalmology;  Laterality: Right;  7.34 01:06.9   CATARACT EXTRACTION W/PHACO Left 12/07/2021   Procedure: CATARACT EXTRACTION PHACO AND INTRAOCULAR LENS PLACEMENT (IOC) LEFT 5.80 01:01.9;  Surgeon: Mittie Gaskin, MD;  Location: [facility];  Service: Ophthalmology;  Laterality: Left;   CHOLECYSTECTOMY     COLONOSCOPY     ESOPHAGOGASTRODUODENOSCOPY (EGD) WITH PROPOFOL  N/A 08/31/2016   Procedure: ESOPHAGOGASTRODUODENOSCOPY (EGD) WITH PROPOFOL ;  Surgeon: Ruel Kung, MD;  Location: [facility];  Service: Endoscopy;  Laterality: N/A;   LUMBAR LAMINECTOMY/DECOMPRESSION MICRODISCECTOMY Bilateral 03/14/2023   Procedure: BILATERAL LUMBAR FOUR-FIVE, LUMBAR FIVE-SACRAL ONE LAMINOTOMY AND FORAMINOTOMY;  Surgeon: Mavis Purchase, MD;  Location: [facility] OR;  Service: Neurosurgery;  Laterality: Bilateral;  3C   Social History:  reports that she has never smoked. She has never used smokeless tobacco. She reports that she does not drink alcohol and does not use drugs.  Allergies  Allergen Reactions   Penicillins Rash    Family History  Problem Relation Age of Onset   Heart disease Father    Breast cancer Neg Hx     Prior to Admission medications   Medication Sig Start Date End Date Taking? Authorizing Provider  Apoaequorin (PREVAGEN PO) Take 1  capsule by mouth daily.    [provider]  atorvastatin  (LIPITOR) 20 MG tablet Take 20 mg by mouth at bedtime. 01/28/20   [provider]  budesonide-formoterol  (SYMBICORT) 160-4.5 MCG/ACT inhaler Inhale 2 puffs into the lungs 2 (two) times daily.    [provider]  Cholecalciferol  (VITAMIN D -3 PO) Take  2,000 Units by mouth daily.    [provider]  clobetasol  ointment (TEMOVATE ) 0.05 % Apply 1 application topically 2 (two) times daily. For up to 2 weeks as needed for itch. Avoid applying to face, groin, and axilla. Use as directed. Risk of skin atrophy with long-term use reviewed. Patient not taking: Reported on 03/05/2023 05/19/21   Moye, [place] , MD  clonazePAM  (KLONOPIN ) 0.5 MG tablet Take 0.5 mg by mouth daily as needed for anxiety.    [provider]  diclofenac Sodium (VOLTAREN) 1 % GEL Apply 2 g topically at bedtime.    [provider]  dicyclomine  (BENTYL ) 10 MG capsule TAKE (1) CAPSULE BY MOUTH FOUR TIMES A DAY BEFORE MEALS AND AT BEDTIME Patient taking differently: Take 10 mg by mouth daily. 11/11/18   Therisa Bi, MD  docusate sodium  (COLACE) 100 MG capsule Take 100 mg by mouth daily.    [provider]  fluticasone  (FLONASE ) 50 MCG/ACT nasal spray Place 2 sprays into both nostrils daily.    [provider]  gentamicin  cream (GARAMYCIN ) 0.1 % Apply 1 application topically 2 (two) times daily. Patient not taking: Reported on 03/05/2023 09/23/19   Janit Thresa HERO, DPM  HYDROcodone -acetaminophen  (NORCO/VICODIN) 5-325 MG tablet Take 1-2 tablets by mouth every 4 (four) hours as needed for moderate pain. 03/15/23   Mavis Purchase, MD  magnesium  oxide (MAG-OX) 400 MG tablet Take 400 mg by mouth daily.    [provider]  SUMAtriptan  (IMITREX ) 50 MG tablet Take 25 mg by mouth every 2 (two) hours as needed for migraine. May repeat in 2 hours if headache persists or recurs.    [provider]  traZODone  (DESYREL ) 50 MG tablet Take 50 mg by mouth at bedtime.    [provider]  VENTOLIN  HFA 108 (90 Base) MCG/ACT inhaler Inhale 1-2 puffs into the lungs every 6 (six) hours as needed for wheezing or shortness of breath. 05/29/19   [provider]    Physical Exam: Vitals:   04/04/24 1631 04/04/24 1633 04/04/24 1930  04/04/24 2000  BP: (!) 132/57  109/84 126/61  Pulse: 93  81 80  Resp: 20  17 (!) 21  Temp: 98.6 F (37 C)     TempSrc: Oral     SpO2: 97%  98% 100%  Weight:  47.6 kg    Height:  5' 3 (1.6 m)     General Appearance:  Frail , elderly, well-nourished, cooperative.She is anxious and  Vague historian.Family at bedside with patient HEENT: PERRLA, sclera clear, no pale or icteric Neck:  Supple, no JVD or adenopathy. Cardiovascular:  Regular rate and rhythm.  Lungs: Diminished breaths sounds, No expiratory wheezes appreciated. Abdomen:  Soft, nontender, nondistended. Normoactive bowel sounds.  Back: No CVA tenderness. Extremities:   No cyanosis, clubbing, or edema. Neuro:  Anxious, but Alert and orient x4; nonfocal. Data Reviewed:   Assessment and Plan: 83 y/o female with history of COPD, HTN, HLD, worsening cognitive impairment with associated increased falls. She presents on account of cough productive of greenish sputum and increased falls. CXR concerning for patchy nodular opacities involving right middle lobe concerning for Community acquired pneumonia.  Community Acquired Pneumonia: Right middle Lobe Sputum grams stain and Blood cultures pending Resp panel ordered and pending Treatment initiated with antibiotics  Acute Bronchitis with bronchospasm, unspecified Underlying COPD, thought to be unrelated to tobacco smoking Patient will be treated with empiric antibiotics and predniSONE  (DELTASONE ) 20 MG daily for 5 days. Antitussives with guaifenesin ,benzonatate     Worsening cognitive impairment and increased falls Concerning for dementia Differentials may include Lewy Body dementia Send TSH, Vit B12 Agitation protocol will be ordered PRN  Increased falls Check for orthostasis PT/OT to assess and advise on post discharge treatment  Anxiety Disorder PRN Benzodiazepines   Advance Care Planning:   Code Status: Full Code   Consults: None  Family Communication: Discussed  plan of care with patients husband and daughter  Severity of Illness: The appropriate patient status for this patient is INPATIENT. Inpatient status is judged to be reasonable and necessary in order to provide the required intensity of service to ensure the patient's safety. The patient's presenting symptoms, physical exam findings, and initial radiographic and laboratory data in the context of their chronic comorbidities is felt to place them at high risk for further clinical deterioration. Furthermore, it is not anticipated that the patient will be medically stable for discharge from the hospital within 2 midnights of admission.   * I certify that at the point of admission it is my clinical judgment that the patient will require inpatient hospital care spanning beyond 2 midnights from the point of admission due to high intensity of service, high risk for further deterioration and high frequency of surveillance required.*  Author: Maude MARLA Dart, MD 04/04/2024 9:28 PM  For on call review [email or web address].

## 2024-04-05 DIAGNOSIS — J189 Pneumonia, unspecified organism: Secondary | ICD-10-CM | POA: Diagnosis not present

## 2024-04-05 LAB — BASIC METABOLIC PANEL WITH GFR
Anion gap: 10 (ref 5–15)
BUN: 11 mg/dL (ref 8–23)
CO2: 24 mmol/L (ref 22–32)
Calcium: 8.7 mg/dL — ABNORMAL LOW (ref 8.9–10.3)
Chloride: 107 mmol/L (ref 98–111)
Creatinine, Ser: 0.64 mg/dL (ref 0.44–1.00)
GFR, Estimated: >60 mL/min (ref >60)
Glucose, Bld: 145 mg/dL — ABNORMAL HIGH (ref 70–99)
Potassium: 3.7 mmol/L (ref 3.5–5.1)
Sodium: 141 mmol/L (ref 135–145)

## 2024-04-05 LAB — CBC
HCT: 37.8 % (ref 36.0–46.0)
Hemoglobin: 12.4 g/dL (ref 12.0–15.0)
MCH: 31.3 pg (ref 26.0–34.0)
MCHC: 32.8 g/dL (ref 30.0–36.0)
MCV: 95.5 fL (ref 80.0–100.0)
Platelets: 241 K/uL (ref 150–400)
RBC: 3.96 MIL/uL (ref 3.87–5.11)
RDW: 12.8 % (ref 11.5–15.5)
WBC: 7.4 K/uL (ref 4.0–10.5)
nRBC: 0 % (ref 0.0–0.2)

## 2024-04-05 LAB — RESPIRATORY PANEL BY PCR

## 2024-04-05 LAB — CK: Total CK: 323 U/L — ABNORMAL HIGH (ref 38–234)

## 2024-04-05 LAB — TSH: TSH: 0.598 u[IU]/mL (ref 0.350–4.500)

## 2024-04-05 LAB — EXPECTORATED SPUTUM ASSESSMENT W GRAM STAIN, RFLX TO RESP C

## 2024-04-05 LAB — PHOSPHORUS: Phosphorus: 3.5 mg/dL (ref 2.5–4.6)

## 2024-04-05 LAB — MAGNESIUM: Magnesium: 2.1 mg/dL (ref 1.7–2.4)

## 2024-04-05 MED ORDER — GUAIFENESIN ER 600 MG PO TB12
600.0000 mg | ORAL_TABLET | Freq: Two times a day (BID) | ORAL | Status: DC
  Administered 2024-04-05 – 2024-04-07 (×5): 600 mg via ORAL
  Filled 2024-04-05 (×5): qty 1

## 2024-04-05 MED ORDER — PREDNISONE 20 MG PO TABS
20.0000 mg | ORAL_TABLET | Freq: Every day | ORAL | Status: DC
  Administered 2024-04-05 – 2024-04-07 (×3): 20 mg via ORAL
  Filled 2024-04-05 (×3): qty 1

## 2024-04-05 MED ORDER — ONDANSETRON HCL 4 MG/2ML IJ SOLN: 4.0000 mg | Freq: Four times a day (QID) | INTRAMUSCULAR | Status: DC | PRN

## 2024-04-05 MED ORDER — QUETIAPINE FUMARATE 25 MG PO TABS: 25.0000 mg | ORAL_TABLET | Freq: Two times a day (BID) | ORAL | Status: DC | PRN

## 2024-04-05 MED ORDER — ACETAMINOPHEN 325 MG PO TABS: 650.0000 mg | ORAL_TABLET | Freq: Four times a day (QID) | ORAL | Status: DC | PRN

## 2024-04-05 MED ORDER — HYDROCOD POLI-CHLORPHE POLI ER 10-8 MG/5ML PO SUER
5.0000 mL | Freq: Two times a day (BID) | ORAL | Status: DC | PRN
  Administered 2024-04-06: 5 mL via ORAL
  Filled 2024-04-05: qty 5

## 2024-04-05 NOTE — Evaluation (Signed)
 Occupational Therapy Evaluation Patient Details Name: Abigail Powers MRN: [id number] DOB: Apr 07, 1941 Today's Date: 04/05/2024   History of Present Illness   83 y.o. female with medical history significant of COPD, Recurrent PNA, HLD, HTN, RLS, anxiety disorder, s/p Lumbar surgery in 09/2023, Greater trochanteric bursitis s/p Steroid injection 01/2024, GERD, who presents with increased falls at home over the past few days. Patient has notably been having increased cognitive impairment and fall over the past several months but this has increased during the past few days. Diagnosed with likely PNA.     Clinical Impressions Pt was seen for OT evaluation this date. Prior to hospital admission, pt was indep with ADL and IADL, per pt/spouse report, including short distance familiar driving and med mgt. Family notes increased restlessness and confusion at night, multiple falls. Pt lives with her spouse who notes he is almost always home with her. Pt pleasant, alert and oriented x4, follows commands well, and able to converse while ambulating with RW requiring supv-CGA. Pt presents to acute OT demonstrating impaired ADL performance and functional mobility 2/2 questionable safety awareness (See OT problem list for additional functional deficits). Pt currently requires supervision for safety, particularly with mobility and IADL. Do not anticipate the need for follow up OT services upon acute hospital DC at this time. No additional acute OT needs.      If plan is discharge home, recommend the following:   Supervision due to cognitive status;Direct supervision/assist for medications management;Direct supervision/assist for financial management;Assist for transportation;Help with stairs or ramp for entrance     Functional Status Assessment   Patient has had a recent decline in their functional status and demonstrates the ability to make significant improvements in function in a reasonable and  predictable amount of time.     Equipment Recommendations   None recommended by OT      Precautions/Restrictions   Precautions Precautions: Fall Recall of Precautions/Restrictions: Impaired Restrictions Weight Bearing Restrictions Per Provider Order: No     Mobility Bed Mobility Overal bed mobility: Modified Independent        Transfers Overall transfer level: Needs assistance Equipment used: Rolling walker (2 wheels) Transfers: Sit to/from Stand Sit to Stand: Supervision          Balance Overall balance assessment: Mild deficits observed, not formally tested, History of Falls         ADL either performed or assessed with clinical judgement   ADL Overall ADL's : Modified independent     General ADL Comments: No difficulty to complete LB dressing including tying shoes laces, sequencing ADL tasks.      Pertinent Vitals/Pain Pain Assessment Pain Assessment: No/denies pain     Extremity/Trunk Assessment Upper Extremity Assessment Upper Extremity Assessment: Overall WFL for tasks assessed   Lower Extremity Assessment Lower Extremity Assessment: Overall WFL for tasks assessed   Cervical / Trunk Assessment Cervical / Trunk Assessment: Normal   Communication Communication Communication: No apparent difficulties Factors Affecting Communication: Hearing impaired   Cognition Arousal: Alert Behavior During Therapy: WFL for tasks assessed/performed Cognition: History of cognitive impairments       OT - Cognition Comments: Per family, mild cognitive impairments suspected; pt alert and oriented x4, questionable awareness of deficits, follows commands well         Following commands: Intact       Cueing  General Comments   Cueing Techniques: Verbal cues      Exercises Other Exercises Other Exercises: Pt/family educated in falls prevention, encouraged assist/supv for  med mgt at this time   Shoulder Instructions      Home Living  Family/patient expects to be discharged to:: Private residence Living Arrangements: Spouse/significant other Available Help at Discharge: Family;Available 24 hours/day Type of Home: House Home Access: Stairs to enter [employer] of Steps: 5 Entrance Stairs-Rails: Right;Left;Can reach both Home Layout: One level     Bathroom Shower/Tub: Producer, television/film/video: Standard     Home Equipment: Agricultural consultant (2 wheels);Cane - single point          Prior Functioning/Environment Prior Level of Function : Driving;History of Falls (last six months);Independent/Modified Independent             Mobility Comments: multiple recent falls, spouse noting that she becomes more restless at night ADLs Comments: indep with ADL, manages her and spouse's medications, drives short familiar distances still, indep with cooking, cleaning    OT Problem List: Decreased safety awareness   OT Treatment/Interventions:        OT Goals(Current goals can be found in the care plan section)   Acute Rehab OT Goals Patient Stated Goal: go home OT Goal Formulation: All assessment and education complete, DC therapy   OT Frequency:       Co-evaluation PT/OT/SLP Co-Evaluation/Treatment: Yes Reason for Co-Treatment: Necessary to address cognition/behavior during functional activity;For patient/therapist safety;To address functional/ADL transfers PT goals addressed during session: Mobility/safety with mobility;Balance;Proper use of DME OT goals addressed during session: ADL's and self-care      AM-PAC OT 6 Clicks Daily Activity     Outcome Measure Help from another person eating meals?: None Help from another person taking care of personal grooming?: None Help from another person toileting, which includes using toliet, bedpan, or urinal?: None Help from another person bathing (including washing, rinsing, drying)?: None Help from another person to put on and taking off regular  upper body clothing?: None Help from another person to put on and taking off regular lower body clothing?: None 6 Click Score: 24   End of Session Equipment Utilized During Treatment: Rolling walker (2 wheels) Nurse Communication: Mobility status  Activity Tolerance: Patient tolerated treatment well Patient left: in bed;with call bell/phone within reach;with bed alarm set;with family/visitor present  OT Visit Diagnosis: Repeated falls (R29.6)                Time: 8555-8540 OT Time Calculation (min): 15 min Charges:  OT General Charges $OT Visit: 1 Visit OT Evaluation $OT Eval Low Complexity: 1 Low  Warren SAUNDERS., MPH, MS, OTR/L ascom [phone] 04/05/24, 3:09 PM

## 2024-04-05 NOTE — Plan of Care (Signed)

## 2024-04-05 NOTE — Evaluation (Signed)
 Physical Therapy Evaluation Patient Details Name: Abigail Powers MRN: [id number] DOB: 1941/01/03 Today's Date: 04/05/2024  History of Present Illness  83 y.o. female with medical history significant of COPD, Recurrent PNA, HLD, HTN, RLS, anxiety disorder, s/p Lumbar surgery in 09/2023, Greater trochanteric bursitis s/p Steroid injection 01/2024, GERD, who presents with increased falls at home over the past few days. Patient has notably been having increased cognitive impairment and fall over the past several months but this has increased during the past few days. Diagnosed with likely PNA.  Clinical Impression  Patient received in bed, family at bedside. She is pleasant and agrees to PT assessment. Patient reports she is feeling better. She is mod I with bed mobility and supervision for all other mobility to include ambulating with RW 250' and ambulating up/down 12 steps with B rails. She does not require skilled PT at this time. Signing off.           If plan is discharge home, recommend the following: Help with stairs or ramp for entrance;Assist for transportation;Supervision due to cognitive status   Can travel by private vehicle    yes    Equipment Recommendations None recommended by PT  Recommendations for Other Services       Functional Status Assessment Patient has not had a recent decline in their functional status     Precautions / Restrictions Precautions Precautions: Fall Recall of Precautions/Restrictions: Impaired Restrictions Weight Bearing Restrictions Per Provider Order: No      Mobility  Bed Mobility Overal bed mobility: Modified Independent                  Transfers Overall transfer level: Needs assistance Equipment used: Rolling walker (2 wheels) Transfers: Sit to/from Stand Sit to Stand: Supervision                Ambulation/Gait Ambulation/Gait assistance: Supervision Gait Distance (Feet): 250 Feet Assistive device: Rolling  walker (2 wheels) Gait Pattern/deviations: Step-through pattern Gait velocity: WNL     General Gait Details: no LOB with ambulation, no fatigue or difficulty noted  Stairs Stairs: Yes Stairs assistance: Supervision Stair Management: Alternating pattern, Forwards, Two rails Number of Stairs: 12 General stair comments: generally safe on steps with B UE support  Wheelchair Mobility     Tilt Bed    Modified Rankin (Stroke Patients Only)       Balance Overall balance assessment: Needs assistance, History of Falls Sitting-balance support: Feet supported Sitting balance-Leahy Scale: Normal     Standing balance support: Bilateral upper extremity supported, During functional activity, Reliant on assistive device for balance Standing balance-Leahy Scale: Good                               Pertinent Vitals/Pain Pain Assessment Pain Assessment: No/denies pain    Home Living Family/patient expects to be discharged to:: Private residence Living Arrangements: Spouse/significant other Available Help at Discharge: Family;Available 24 hours/day Type of Home: House Home Access: Stairs to enter Entrance Stairs-Rails: Right;Left;Can reach both Entrance Stairs-Number of Steps: 5   Home Layout: One level Home Equipment: Agricultural consultant (2 wheels);Cane - single point      Prior Function Prior Level of Function : Driving;History of Falls (last six months);Independent/Modified Independent             Mobility Comments: multiple recent falls, spouse noting that she becomes more restless at night, has been using the walker recently due to falls  ADLs Comments: indep with ADL, manages her and spouse's medications, drives short familiar distances still, indep with cooking, cleaning     Extremity/Trunk Assessment   Upper Extremity Assessment Upper Extremity Assessment: Overall WFL for tasks assessed    Lower Extremity Assessment Lower Extremity Assessment: Overall WFL  for tasks assessed    Cervical / Trunk Assessment Cervical / Trunk Assessment: Normal  Communication   Communication Communication: No apparent difficulties Factors Affecting Communication: Hearing impaired    Cognition Arousal: Alert Behavior During Therapy: WFL for tasks assessed/performed   PT - Cognitive impairments: Safety/Judgement                         Following commands: Intact       Cueing Cueing Techniques: Verbal cues     General Comments      Exercises     Assessment/Plan    PT Assessment Patient does not need any further PT services  PT Problem List         PT Treatment Interventions      PT Goals (Current goals can be found in the Care Plan section)  Acute Rehab PT Goals Patient Stated Goal: return home PT Goal Formulation: With patient/family Time For Goal Achievement: 04/07/24 Potential to Achieve Goals: Good    Frequency       Co-evaluation PT/OT/SLP Co-Evaluation/Treatment: Yes Reason for Co-Treatment: Necessary to address cognition/behavior during functional activity;For patient/therapist safety;To address functional/ADL transfers PT goals addressed during session: Mobility/safety with mobility;Balance;Proper use of DME OT goals addressed during session: ADL's and self-care       AM-PAC PT 6 Clicks Mobility  Outcome Measure Help needed turning from your back to your side while in a flat bed without using bedrails?: None Help needed moving from lying on your back to sitting on the side of a flat bed without using bedrails?: None   Help needed standing up from a chair using your arms (e.g., wheelchair or bedside chair)?: None Help needed to walk in hospital room?: None Help needed climbing 3-5 steps with a railing? : None 6 Click Score: 20    End of Session   Activity Tolerance: Patient tolerated treatment well Patient left: in bed;with call bell/phone within reach;with bed alarm set;with family/visitor  present Nurse Communication: Mobility status      Time: 1444-1459 PT Time Calculation (min) (ACUTE ONLY): 15 min   Charges:   PT Evaluation $PT Eval Low Complexity: 1 Low   PT General Charges $$ ACUTE PT VISIT: 1 Visit         Jaidon Ellery, PT, GCS 04/05/24,3:11 PM

## 2024-04-05 NOTE — Progress Notes (Signed)
 [facility] Progress Note  Patient: Abigail Powers    FMW:[id number]  DOA: 04/04/2024     Date of Service: the patient was seen and examined on 04/05/2024  Chief Complaint  Patient presents with   Fall   Brief hospital course: Laverta Harnisch is a 83 y.o. female with medical history significant of COPD, Recurrent pneumonia, Hyperlipidemia,HTN,RLS, anxiety disorder, s/p Lumbar surgery in 09/2023, Greater trochanteric bursitis s/p Steroid injection 01/2024 who presents with family on account of increased falls at home over the past few days. Patient has notably been having increased cognitive impairment and fall over the past several months but this has increased during the past few days. Family could not figure out the obvious cause but reported increased coughing spells productive of yellowish sputum. Sick contact likely from Husband, who was apparently sick recently. She denies any associated fever or chills. For her gait disturbance, daughter describes shuffling gait but no reported resting tremors.    Assessment and Plan:  # Community Acquired Pneumonia: Right middle Lobe Negative COVID, flu and RSV RVP panel is pending Follow blood cultures NGTD Follow sputum culture Continue ceftriaxone  azithromycin  Started Mucinex  extremity gram p.o. twice daily Continue Tussionex as needed    # Acute Bronchitis with bronchospasm, unspecified Underlying COPD, thought to be unrelated to tobacco smoking Continue antibiotics as above Prednisone  20 mg p.o. daily for 5 days Continue DuoNeb and Breo Ellipta  inhaler    # Worsening cognitive impairment and increased falls Concerning for dementia Differentials may include Lewy Body dementia TSH 0.59, at lower and Check free T4 level Follow Vit B12 Agitation protocol will be ordered PRN Seroquel  25 mg p.o. twice daily as needed for agitation and sundowning  # Increased falls Check for orthostasis PT/OT eval done, OT  recommended no needs, PT recommended to use walker for her safety. to assess and advise on post discharge treatment   # Anxiety Disorder PRN Benzodiazepines   Body mass index is 18.6 kg/m.  Interventions:    Diet: Regular DVT Prophylaxis: Subcutaneous Lovenox    Advance goals of care discussion: Full code  Family Communication: family was present at bedside, at the time of interview.  The pt provided permission to discuss medical plan with the family. Opportunity was given to ask question and all questions were answered satisfactorily.   Disposition:  Pt is from Home, admitted with pneumonia and frequent falls, still on IV antibiotics, which precludes a safe discharge. Discharge to Home, when stable, most likely in 1 to 2 days.  Subjective: No significant events overnight, patient still having productive cough with yellow-colored phlegm, no fever or chills, denied any other complaints.  Physical Exam: General: NAD, lying comfortably Appear in no distress, affect appropriate Eyes: PERRLA ENT: Oral Mucosa Clear, moist  Neck: no JVD,  Cardiovascular: S1 and S2 Present, no Murmur,  Respiratory: good respiratory effort, Bilateral Air entry equal and Decreased, no Crackles, no wheezes Abdomen: Bowel Sound present, Soft and no tenderness,  Skin: no rashes Extremities: no Pedal edema, no calf tenderness Neurologic: without any new focal findings Gait not checked due to patient safety concerns  Vitals:   04/04/24 2140 04/04/24 2200 04/04/24 2241 04/05/24 0839  BP: (!) 149/58 113/70 123/67 (!) 119/53  Pulse: 91 87 88 69  Resp: 19 20 16 18   Temp: 98.9 F (37.2 C) 99.2 F (37.3 C) 98.6 F (37 C) 97.9 F (36.6 C)  TempSrc: Oral Oral    SpO2: 98% 98% 99% 95%  Weight:  Height:        Intake/Output Summary (Last 24 hours) at 04/05/2024 1510 Last data filed at 04/05/2024 0910 Gross per 24 hour  Intake 360 ml  Output --  Net 360 ml   Filed Weights   04/04/24 1633   Weight: 47.6 kg    Data Reviewed: I have personally reviewed and interpreted daily labs, tele strips, imagings as discussed above. I reviewed all nursing notes, pharmacy notes, vitals, pertinent old records I have discussed plan of care as described above with RN and patient/family.  CBC: Recent Labs  Lab 04/04/24 1634 04/05/24 0526  WBC 8.8 7.4  NEUTROABS 6.6  --   HGB 12.2 12.4  HCT 37.3 37.8  MCV 95.9 95.5  PLT 232 241   Basic Metabolic Panel: Recent Labs  Lab 04/04/24 1634 04/05/24 0526  NA 140 141  K 3.8 3.7  CL 105 107  CO2 26 24  GLUCOSE 113* 145*  BUN 11 11  CREATININE 0.65 0.64  CALCIUM  8.9 8.7*  MG  --  2.1  PHOS  --  3.5    Studies: CT Head Wo Contrast Result Date: 04/04/2024 CLINICAL DATA:  Difficulty with ambulation, frequent falls in the last several days. EXAM: CT HEAD WITHOUT CONTRAST TECHNIQUE: Contiguous axial images were obtained from the base of the skull through the vertex without intravenous contrast. RADIATION DOSE REDUCTION: This exam was performed according to the departmental dose-optimization program which includes automated exposure control, adjustment of the mA and/or kV according to patient size and/or use of iterative reconstruction technique. COMPARISON:  MRI head 09/20/2021, CT head 07/22/2021. FINDINGS: Brain: No acute intracranial hemorrhage. No CT evidence of acute infarct. Nonspecific hypoattenuation in the periventricular and subcortical white matter favored to reflect chronic microvascular ischemic changes. Mild parenchymal volume loss. No edema, mass effect, or midline shift. The basilar cisterns are patent. Ventricles: The ventricles are normal. Vascular: No hyperdense vessel or unexpected calcification. Skull: No acute or aggressive finding. Similar appearance of calcification along the inner table of the right frontal calvarium which may reflect an exostosis versus calcified meningioma. Orbits: Bilateral lens replacement. Sinuses: The  visualized paranasal sinuses are clear. Other: Small chronic left mastoid effusion. IMPRESSION: No CT evidence of acute intracranial abnormality. Chronic microvascular ischemic changes and mild parenchymal volume loss. Electronically Signed   By: Donnice Mania M.D.   On: 04/04/2024 19:08   DG Chest 2 View Result Date: 04/04/2024 CLINICAL DATA:  10031 Cough 10031 EXAM: CHEST - 2 VIEW COMPARISON:  CT chest 10/17/2022 FINDINGS: The heart and mediastinal contours are unchanged. Atherosclerotic plaque. Patchy nodular like airspace opacities along mid lower lung zones the right. No pulmonary edema. No pleural effusion. No pneumothorax. No acute osseous abnormality. IMPRESSION: Patchy nodular-like airspace opacities along mid lower lung zones the right. Followup PA and lateral chest X-ray is recommended in 3-4 weeks following therapy to ensure resolution and exclude underlying malignancy. Electronically Signed   By: Morgane  Naveau M.D.   On: 04/04/2024 17:27   DG Hip Unilat  With Pelvis 2-3 Views Right Result Date: 04/04/2024 CLINICAL DATA:  fall, hip pain EXAM: DG HIP (WITH OR WITHOUT PELVIS) 2-3V RIGHT COMPARISON:  None Available. FINDINGS: There is no evidence of hip fracture or dislocation of the right hip. No acute displaced fracture or dislocation of the left hip. No acute displaced fracture or diastasis of the bones of the pelvis. There is no evidence of arthropathy or other focal bone abnormality. IMPRESSION: Negative for acute traumatic injury. Electronically Signed  By: Morgane  Naveau M.D.   On: 04/04/2024 17:26    Scheduled Meds:  atorvastatin   20 mg Oral QHS   benzonatate   200 mg Oral TID   cholecalciferol   2,000 Units Oral Daily   docusate sodium   100 mg Oral Daily   enoxaparin  (LOVENOX ) injection  40 mg Subcutaneous Q24H   fluticasone   2 spray Each Nare Daily   fluticasone  furoate-vilanterol  1 puff Inhalation Daily   guaiFENesin   600 mg Oral BID   magnesium  oxide  400 mg Oral Daily    predniSONE   20 mg Oral Q breakfast   traZODone   50 mg Oral QHS   Continuous Infusions:  azithromycin      cefTRIAXone  (ROCEPHIN )  IV     PRN Meds: acetaminophen , albuterol , chlorpheniramine-HYDROcodone , clonazePAM , ondansetron  (ZOFRAN ) IV  Time spent: 35 minutes  Author: ELVAN SOR. MD [facility] 04/05/2024 3:10 PM  To reach On-call, see care teams to locate the attending and reach out to them via [email or web address]. If 7PM-7AM, please contact night-coverage If you still have difficulty reaching the attending provider, please page the [facility] (Director on Call) for [facility] on amion for assistance.

## 2024-04-06 DIAGNOSIS — J189 Pneumonia, unspecified organism: Secondary | ICD-10-CM | POA: Diagnosis not present

## 2024-04-06 LAB — BASIC METABOLIC PANEL WITH GFR
Anion gap: 11 (ref 5–15)
BUN: 19 mg/dL (ref 8–23)
CO2: 23 mmol/L (ref 22–32)
Calcium: 8.9 mg/dL (ref 8.9–10.3)
Chloride: 110 mmol/L (ref 98–111)
Creatinine, Ser: 0.64 mg/dL (ref 0.44–1.00)
GFR, Estimated: >60 mL/min (ref >60)
Glucose, Bld: 120 mg/dL — ABNORMAL HIGH (ref 70–99)
Potassium: 4.1 mmol/L (ref 3.5–5.1)
Sodium: 144 mmol/L (ref 135–145)

## 2024-04-06 LAB — CBC
HCT: 36.7 % (ref 36.0–46.0)
Hemoglobin: 12.4 g/dL (ref 12.0–15.0)
MCH: 31.7 pg (ref 26.0–34.0)
MCHC: 33.8 g/dL (ref 30.0–36.0)
MCV: 93.9 fL (ref 80.0–100.0)
Platelets: 270 K/uL (ref 150–400)
RBC: 3.91 MIL/uL (ref 3.87–5.11)
RDW: 12.8 % (ref 11.5–15.5)
WBC: 8 K/uL (ref 4.0–10.5)
nRBC: 0 % (ref 0.0–0.2)

## 2024-04-06 LAB — MAGNESIUM: Magnesium: 2.4 mg/dL (ref 1.7–2.4)

## 2024-04-06 LAB — T4, FREE: Free T4: 0.95 ng/dL (ref 0.61–1.12)

## 2024-04-06 LAB — PHOSPHORUS: Phosphorus: 3.4 mg/dL (ref 2.5–4.6)

## 2024-04-06 LAB — VITAMIN D 25 HYDROXY (VIT D DEFICIENCY, FRACTURES): Vit D, 25-Hydroxy: 94.79 ng/mL (ref 30–100)

## 2024-04-06 LAB — VITAMIN B12: Vitamin B-12: 545 pg/mL (ref 180–914)

## 2024-04-06 NOTE — Plan of Care (Signed)

## 2024-04-06 NOTE — Progress Notes (Signed)
 [facility] Progress Note  Patient: Abigail Powers    FMW:[id number]  DOA: 04/04/2024     Date of Service: the patient was seen and examined on 04/06/2024  Chief Complaint  Patient presents with   Fall   Brief hospital course: Abigail Powers is a 83 y.o. female with medical history significant of COPD, Recurrent pneumonia, Hyperlipidemia,HTN,RLS, anxiety disorder, s/p Lumbar surgery in 09/2023, Greater trochanteric bursitis s/p Steroid injection 01/2024 who presents with family on account of increased falls at home over the past few days. Patient has notably been having increased cognitive impairment and fall over the past several months but this has increased during the past few days. Family could not figure out the obvious cause but reported increased coughing spells productive of yellowish sputum. Sick contact likely from Husband, who was apparently sick recently. She denies any associated fever or chills. For her gait disturbance, daughter describes shuffling gait but no reported resting tremors.    Assessment and Plan:  # Community Acquired Pneumonia: Right middle Lobe Negative COVID, flu, RSV, and RVP panel blood cultures NGTD Follow sputum culture, growing GPC, GPR and GNR Continue ceftriaxone  azithromycin  Started Mucinex  extremity gram p.o. twice daily Continue Tussionex as needed    # Acute Bronchitis with bronchospasm, unspecified Underlying COPD, thought to be unrelated to tobacco smoking Continue antibiotics as above Prednisone  20 mg p.o. daily for 5 days Continue DuoNeb and Breo Ellipta  inhaler    # Worsening cognitive impairment and increased falls Concerning for dementia Differentials may include Lewy Body dementia TSH 0.59, at lower and Check free T4 level Vit B12 wnl Agitation protocol will be ordered PRN Seroquel  25 mg p.o. twice daily as needed for agitation and sundowning  # Increased falls Orthostatic vitals stable PT/OT eval done, OT  recommended no needs, PT recommended to use walker for her safety. to assess and advise on post discharge treatment   # Anxiety Disorder PRN Benzodiazepines   Body mass index is 18.6 kg/m.  Interventions:    Diet: Regular DVT Prophylaxis: Subcutaneous Lovenox    Advance goals of care discussion: Full code  Family Communication: family was present at bedside, at the time of interview.  The pt provided permission to discuss medical plan with the family. Opportunity was given to ask question and all questions were answered satisfactorily.   Disposition:  Pt is from Home, admitted with pneumonia and frequent falls, still on IV antibiotics, which precludes a safe discharge. Discharge to Home, when stable, most likely in 1 to 2 days.  Subjective: No significant events overnight, patient is having productive cough, slight yellow phlegm, feels a bit improvement.  Denied any worsening of shortness of breath, no chest pain appropriations.   Physical Exam: General: NAD, lying comfortably Appear in no distress, affect appropriate Eyes: PERRLA ENT: Oral Mucosa Clear, moist  Neck: no JVD,  Cardiovascular: S1 and S2 Present, no Murmur,  Respiratory: good respiratory effort, Bilateral Air entry equal and Decreased, no Crackles, no wheezes Abdomen: Bowel Sound present, Soft and no tenderness,  Skin: no rashes Extremities: no Pedal edema, no calf tenderness Neurologic: without any new focal findings Gait not checked due to patient safety concerns  Vitals:   04/06/24 0416 04/06/24 0438 04/06/24 0440 04/06/24 0848  BP: 109/73 (!) 140/75 129/65 (!) 124/57  Pulse: 81 79 75 75  Resp: 18 18 18 16   Temp: 98.4 F (36.9 C)   98.7 F (37.1 C)  TempSrc:      SpO2: 98% 97% 99% 96%  Weight:      Height:        Intake/Output Summary (Last 24 hours) at 04/06/2024 1508 Last data filed at 04/06/2024 0400 Gross per 24 hour  Intake 741.81 ml  Output --  Net 741.81 ml   Filed Weights   04/04/24  1633  Weight: 47.6 kg    Data Reviewed: I have personally reviewed and interpreted daily labs, tele strips, imagings as discussed above. I reviewed all nursing notes, pharmacy notes, vitals, pertinent old records I have discussed plan of care as described above with RN and patient/family.  CBC: Recent Labs  Lab 04/04/24 1634 04/05/24 0526 04/06/24 0523  WBC 8.8 7.4 8.0  NEUTROABS 6.6  --   --   HGB 12.2 12.4 12.4  HCT 37.3 37.8 36.7  MCV 95.9 95.5 93.9  PLT 232 241 270   Basic Metabolic Panel: Recent Labs  Lab 04/04/24 1634 04/05/24 0526 04/06/24 0523  NA 140 141 144  K 3.8 3.7 4.1  CL 105 107 110  CO2 26 24 23   GLUCOSE 113* 145* 120*  BUN 11 11 19   CREATININE 0.65 0.64 0.64  CALCIUM  8.9 8.7* 8.9  MG  --  2.1 2.4  PHOS  --  3.5 3.4    Studies: No results found.   Scheduled Meds:  atorvastatin   20 mg Oral QHS   benzonatate   200 mg Oral TID   cholecalciferol   2,000 Units Oral Daily   docusate sodium   100 mg Oral Daily   enoxaparin  (LOVENOX ) injection  40 mg Subcutaneous Q24H   fluticasone   2 spray Each Nare Daily   fluticasone  furoate-vilanterol  1 puff Inhalation Daily   guaiFENesin   600 mg Oral BID   magnesium  oxide  400 mg Oral Daily   predniSONE   20 mg Oral Q breakfast   traZODone   50 mg Oral QHS   Continuous Infusions:  azithromycin  500 mg (04/05/24 2037)   cefTRIAXone  (ROCEPHIN )  IV 2 g (04/05/24 1950)   PRN Meds: acetaminophen , albuterol , chlorpheniramine-HYDROcodone , clonazePAM , ondansetron  (ZOFRAN ) IV, QUEtiapine   Time spent: 35 minutes  Author: ELVAN SOR. MD [facility] 04/06/2024 3:08 PM  To reach On-call, see care teams to locate the attending and reach out to them via [email or web address]. If 7PM-7AM, please contact night-coverage If you still have difficulty reaching the attending provider, please page the [facility] (Director on Call) for [facility] on amion for assistance.

## 2024-04-07 ENCOUNTER — Other Ambulatory Visit: Payer: Self-pay

## 2024-04-07 DIAGNOSIS — J189 Pneumonia, unspecified organism: Secondary | ICD-10-CM | POA: Diagnosis not present

## 2024-04-07 LAB — CBC
HCT: 32 % — ABNORMAL LOW (ref 36.0–46.0)
Hemoglobin: 10.5 g/dL — ABNORMAL LOW (ref 12.0–15.0)
MCH: 31.6 pg (ref 26.0–34.0)
MCHC: 32.8 g/dL (ref 30.0–36.0)
MCV: 96.4 fL (ref 80.0–100.0)
Platelets: 253 K/uL (ref 150–400)
RBC: 3.32 MIL/uL — ABNORMAL LOW (ref 3.87–5.11)
RDW: 12.9 % (ref 11.5–15.5)
WBC: 5.9 K/uL (ref 4.0–10.5)
nRBC: 0 % (ref 0.0–0.2)

## 2024-04-07 LAB — BASIC METABOLIC PANEL WITH GFR
Anion gap: 7 (ref 5–15)
BUN: 22 mg/dL (ref 8–23)
CO2: 24 mmol/L (ref 22–32)
Calcium: 8.5 mg/dL — ABNORMAL LOW (ref 8.9–10.3)
Chloride: 112 mmol/L — ABNORMAL HIGH (ref 98–111)
Creatinine, Ser: 0.62 mg/dL (ref 0.44–1.00)
GFR, Estimated: >60 mL/min (ref >60)
Glucose, Bld: 97 mg/dL (ref 70–99)
Potassium: 3.6 mmol/L (ref 3.5–5.1)
Sodium: 143 mmol/L (ref 135–145)

## 2024-04-07 LAB — CULTURE, RESPIRATORY W GRAM STAIN: Culture: NORMAL

## 2024-04-07 LAB — CK: Total CK: 107 U/L (ref 38–234)

## 2024-04-07 LAB — MAGNESIUM: Magnesium: 2.3 mg/dL (ref 1.7–2.4)

## 2024-04-07 LAB — PHOSPHORUS: Phosphorus: 3.2 mg/dL (ref 2.5–4.6)

## 2024-04-07 MED ORDER — CEFDINIR 300 MG PO CAPS
300.0000 mg | ORAL_CAPSULE | Freq: Two times a day (BID) | ORAL | 0 refills | Status: AC
  Filled 2024-04-07: qty 6, 3d supply, fill #0

## 2024-04-07 MED ORDER — SODIUM CHLORIDE 0.9 % IV SOLN
2.0000 g | INTRAVENOUS | Status: AC
  Administered 2024-04-07: 2 g via INTRAVENOUS
  Filled 2024-04-07: qty 20

## 2024-04-07 MED ORDER — GUAIFENESIN ER 600 MG PO TB12
600.0000 mg | ORAL_TABLET | Freq: Two times a day (BID) | ORAL | 0 refills | Status: AC
  Filled 2024-04-07: qty 10, 5d supply, fill #0

## 2024-04-07 MED ORDER — SODIUM CHLORIDE 0.9 % IV SOLN
500.0000 mg | Freq: Once | INTRAVENOUS | Status: AC
  Administered 2024-04-07: 500 mg via INTRAVENOUS
  Filled 2024-04-07: qty 5

## 2024-04-07 NOTE — TOC Progression Note (Signed)
 Transition of Care [facility]) - Progression Note    Patient Details  Name: Abigail Powers MRN: [id number] Date of Birth: 01-01-1941  Transition of Care [facility]) CM/SW Contact  Marinda Cooks, RN Phone Number: 04/07/2024, 1:46 PM  Clinical Narrative:    Pt not medically cleared for dc today. No TOC needs assessed or requested at this time .         Expected Discharge Plan and Services         Expected Discharge Date: 04/07/24                                     Social Determinants of Health (SDOH) Interventions SDOH Screenings   Food Insecurity: No Food Insecurity (04/04/2024)  Housing: Low Risk  (04/04/2024)  Transportation Needs: No Transportation Needs (04/04/2024)  Utilities: Not At Risk (04/04/2024)  Financial Resource Strain: Low Risk  (10/26/2023)   Received from [facility]  Social Connections: Socially Integrated (04/04/2024)  Tobacco Use: Low Risk  (04/04/2024)    Readmission Risk Interventions     No data to display

## 2024-04-07 NOTE — Discharge Summary (Signed)
 [facility] Discharge Summary   Patient: Abigail Powers FMW:[id number]  PCP: Cleotilde Oneil FALCON, MD  Date of admission: 04/04/2024   Date of discharge:  04/07/2024     Discharge Diagnoses:  Principal Problem:   CAP (community acquired pneumonia)   Admitted From: Home Disposition:  Home   Recommendations for Outpatient Follow-up:  Follow-up with PCP in 1 week,  Follow up LABS/TEST:  repeat chest x-ray after 4 weeks for resolution of pneumonia.    Follow-up Information     Cleotilde Oneil FALCON, MD Follow up.   Specialty: Internal Medicine Why: Hospital follow up Contact information: [address] [facility] [address] [address] [facility] [place] [address] [phone]                Diet recommendation: Regular diet  Activity: The patient is advised to gradually reintroduce usual activities, as tolerated  Discharge Condition: stable  Code Status: Full code   History of present illness: As per the H and P dictated on admission.  Hospital Course:  Abigail Powers is a 83 y.o. female with medical history significant of COPD, Recurrent pneumonia, Hyperlipidemia,HTN,RLS, anxiety disorder, s/p Lumbar surgery in 09/2023, Greater trochanteric bursitis s/p Steroid injection 01/2024 who presents with family on account of increased falls at home over the past few days. Patient has notably been having increased cognitive impairment and fall over the past several months but this has increased during the past few days. Family could not figure out the obvious cause but reported increased coughing spells productive of yellowish sputum. Sick contact likely from Husband, who was apparently sick recently. She denies any associated fever or chills. For her gait disturbance, daughter describes shuffling gait but no reported resting tremors.      Assessment and Plan:   # Community Acquired Pneumonia: Right middle Lobe Negative COVID, flu, RSV, and RVP panel. blood cultures  NGTD sputum culture growing normal flora.  S/p ceftriaxone  and azithromycin . S/p Mucinex  and Tussionex as needed.  Patient was discharged on Omnicef  300 mg p.o. twice daily for 3 days to complete 7-day course of antibiotics.  Mucinex  600 mg p.o. twice daily for 5 days.  Follow-up with PCP to repeat chest x-ray after 4 weeks of isolation of pneumonia.     # Acute Bronchitis with bronchospasm, unspecified Underlying COPD, thought to be unrelated to tobacco smoking S/p antibiotics as above and Prednisone  20 mg p.o. daily. S/p DuoNeb and Breo Ellipta  inhaler.  Continued home inhaler.   # Worsening cognitive impairment and increased falls Concerning for dementia. Differentials may include Lewy Body dementia. TSH 0.59, at lower and free T4 level 0.95. Vit B12 wnl Seroquel  25 mg p.o. twice daily as needed for agitation and sundowning. Recommended to follow with PCP and neurology as an outpatient for further management.   # Increased falls Orthostatic vitals stable PT/OT eval done, OT recommended no needs, PT recommended to use walker for her safety.  # Anxiety Disorder: PRN Benzodiazepines  Body mass index is 18.6 kg/m.  Nutrition Interventions:  - Patient was instructed, not to drive, operate heavy machinery, perform activities at heights, swimming or participation in water activities or provide baby sitting services while on Pain, Sleep and Anxiety Medications; until her outpatient Physician has advised to do so again.  - Also recommended to not to take more than prescribed Pain, Sleep and Anxiety Medications. Patient was seen by physical therapy, who recommended no therapy needed on discharge,  On the day of the discharge the patient's vitals  were stable, and no other acute medical condition were reported by patient. the patient was felt safe to be discharge at Home.  Consultants: None Procedures: None  Discharge Exam: General: Appear in no distress, no Rash; Oral Mucosa Clear,  moist. Cardiovascular: S1 and S2 Present, no Murmur, Respiratory: normal respiratory effort, Bilateral Air entry present and no Crackles, no wheezes Abdomen: Bowel Sound present, Soft and no tenderness, no hernia Extremities: no Pedal edema, no calf tenderness Neurology: alert and oriented to time, place, and person affect appropriate.  Filed Weights   04/04/24 1633  Weight: 47.6 kg   Vitals:   04/06/24 2016 04/07/24 0449  BP: (!) 138/59 (!) 131/52  Pulse: 65 69  Resp:  18  Temp: 97.7 F (36.5 C) 98.1 F (36.7 C)  SpO2: 96% 94%    DISCHARGE MEDICATION: Allergies as of 04/07/2024       Reactions   Penicillins Rash        Medication List     STOP taking these medications    budesonide-formoterol  160-4.5 MCG/ACT inhaler Commonly known as: SYMBICORT   clobetasol  ointment 0.05 % Commonly known as: TEMOVATE    dicyclomine  10 MG capsule Commonly known as: BENTYL    gentamicin  cream 0.1 % Commonly known as: GARAMYCIN    HYDROcodone -acetaminophen  5-325 MG tablet Commonly known as: NORCO/VICODIN   Ventolin  HFA 108 (90 Base) MCG/ACT inhaler Generic drug: albuterol        TAKE these medications    atorvastatin  20 MG tablet Commonly known as: LIPITOR Take 20 mg by mouth at bedtime.   cefdinir  300 MG capsule Commonly known as: OMNICEF  Take 1 capsule (300 mg total) by mouth 2 (two) times daily for 3 days.   docusate sodium  100 MG capsule Commonly known as: COLACE Take 100 mg by mouth daily.   donepezil 5 MG tablet Commonly known as: ARICEPT Take 5 mg by mouth in the morning.   fluticasone  50 MCG/ACT nasal spray Commonly known as: FLONASE  Place 2 sprays into both nostrils daily.   guaiFENesin  600 MG 12 hr tablet Commonly known as: MUCINEX  Take 1 tablet (600 mg total) by mouth 2 (two) times daily for 5 days.   KlonoPIN  0.5 MG tablet Generic drug: clonazePAM  Take 0.5 mg by mouth daily as needed for anxiety.   magnesium  oxide 400 MG tablet Commonly  known as: MAG-OX Take 400 mg by mouth daily.   PREVAGEN PO Take 1 capsule by mouth daily.   SUMAtriptan  50 MG tablet Commonly known as: IMITREX  Take 25 mg by mouth every 2 (two) hours as needed for migraine. May repeat in 2 hours if headache persists or recurs.   traZODone  50 MG tablet Commonly known as: DESYREL  Take 50 mg by mouth at bedtime.   VITAMIN D -3 PO Take 2,000 Units by mouth daily.   Voltaren 1 % Gel Generic drug: diclofenac Sodium Apply 2 g topically at bedtime.   Wixela Inhub 100-50 MCG/ACT Aepb Generic drug: fluticasone -salmeterol Inhale 1 puff into the lungs every 12 (twelve) hours.       Allergies  Allergen Reactions   Penicillins Rash   Discharge Instructions     Call MD for:  difficulty breathing, headache or visual disturbances   Complete by: As directed    Call MD for:  extreme fatigue   Complete by: As directed    Call MD for:  persistant dizziness or light-headedness   Complete by: As directed    Call MD for:  persistant nausea and vomiting   Complete by: As  directed    Call MD for:  severe uncontrolled pain   Complete by: As directed    Call MD for:  temperature >100.4   Complete by: As directed    Diet - low sodium heart healthy   Complete by: As directed    Discharge instructions   Complete by: As directed    Follow-up with PCP in 1 week, repeat chest x-ray after 4 weeks for resolution of pneumonia.   Increase activity slowly   Complete by: As directed        The results of significant diagnostics from this hospitalization (including imaging, microbiology, ancillary and laboratory) are listed below for reference.    Significant Diagnostic Studies: CT Head Wo Contrast Result Date: 04/04/2024 CLINICAL DATA:  Difficulty with ambulation, frequent falls in the last several days. EXAM: CT HEAD WITHOUT CONTRAST TECHNIQUE: Contiguous axial images were obtained from the base of the skull through the vertex without intravenous contrast.  RADIATION DOSE REDUCTION: This exam was performed according to the departmental dose-optimization program which includes automated exposure control, adjustment of the mA and/or kV according to patient size and/or use of iterative reconstruction technique. COMPARISON:  MRI head 09/20/2021, CT head 07/22/2021. FINDINGS: Brain: No acute intracranial hemorrhage. No CT evidence of acute infarct. Nonspecific hypoattenuation in the periventricular and subcortical white matter favored to reflect chronic microvascular ischemic changes. Mild parenchymal volume loss. No edema, mass effect, or midline shift. The basilar cisterns are patent. Ventricles: The ventricles are normal. Vascular: No hyperdense vessel or unexpected calcification. Skull: No acute or aggressive finding. Similar appearance of calcification along the inner table of the right frontal calvarium which may reflect an exostosis versus calcified meningioma. Orbits: Bilateral lens replacement. Sinuses: The visualized paranasal sinuses are clear. Other: Small chronic left mastoid effusion. IMPRESSION: No CT evidence of acute intracranial abnormality. Chronic microvascular ischemic changes and mild parenchymal volume loss. Electronically Signed   By: Donnice Mania M.D.   On: 04/04/2024 19:08   DG Chest 2 View Result Date: 04/04/2024 CLINICAL DATA:  10031 Cough 10031 EXAM: CHEST - 2 VIEW COMPARISON:  CT chest 10/17/2022 FINDINGS: The heart and mediastinal contours are unchanged. Atherosclerotic plaque. Patchy nodular like airspace opacities along mid lower lung zones the right. No pulmonary edema. No pleural effusion. No pneumothorax. No acute osseous abnormality. IMPRESSION: Patchy nodular-like airspace opacities along mid lower lung zones the right. Followup PA and lateral chest X-ray is recommended in 3-4 weeks following therapy to ensure resolution and exclude underlying malignancy. Electronically Signed   By: Morgane  Naveau M.D.   On: 04/04/2024 17:27   DG  Hip Unilat  With Pelvis 2-3 Views Right Result Date: 04/04/2024 CLINICAL DATA:  fall, hip pain EXAM: DG HIP (WITH OR WITHOUT PELVIS) 2-3V RIGHT COMPARISON:  None Available. FINDINGS: There is no evidence of hip fracture or dislocation of the right hip. No acute displaced fracture or dislocation of the left hip. No acute displaced fracture or diastasis of the bones of the pelvis. There is no evidence of arthropathy or other focal bone abnormality. IMPRESSION: Negative for acute traumatic injury. Electronically Signed   By: Morgane  Naveau M.D.   On: 04/04/2024 17:26    Microbiology: Recent Results (from the past 240 hours)  Blood culture (routine x 2)     Status: None (Preliminary result)   Collection Time: 04/04/24  7:25 PM   Specimen: Right Antecubital; Blood  Result Value Ref Range Status   Specimen Description RIGHT ANTECUBITAL  Final   Special Requests  Final    BOTTLES DRAWN AEROBIC AND ANAEROBIC Blood Culture adequate volume   Culture   Final    NO GROWTH 3 DAYS Performed at [facility], [address] [place], [place] [address]    Report Status PENDING  Incomplete  Blood culture (routine x 2)     Status: None (Preliminary result)   Collection Time: 04/04/24  7:26 PM   Specimen: Left Antecubital; Blood  Result Value Ref Range Status   Specimen Description LEFT ANTECUBITAL  Final   Special Requests   Final    BOTTLES DRAWN AEROBIC AND ANAEROBIC Blood Culture adequate volume   Culture   Final    NO GROWTH 3 DAYS Performed at [facility], [address], [place], [place] [address]    Report Status PENDING  Incomplete  Respiratory (~20 pathogens) panel by PCR     Status: None   Collection Time: 04/04/24  9:49 PM   Specimen: Anterior Nasal Swab; Respiratory  Result Value Ref Range Status   Adenovirus NOT DETECTED NOT DETECTED Final   Coronavirus 229E NOT DETECTED NOT DETECTED Final    Comment: (NOTE) The Coronavirus on the Respiratory Panel, DOES NOT test  for the novel  Coronavirus (2019 nCoV)    Coronavirus HKU1 NOT DETECTED NOT DETECTED Final   Coronavirus NL63 NOT DETECTED NOT DETECTED Final   Coronavirus OC43 NOT DETECTED NOT DETECTED Final   Metapneumovirus NOT DETECTED NOT DETECTED Final   Rhinovirus / Enterovirus NOT DETECTED NOT DETECTED Final   Influenza A NOT DETECTED NOT DETECTED Final   Influenza B NOT DETECTED NOT DETECTED Final   Parainfluenza Virus 1 NOT DETECTED NOT DETECTED Final   Parainfluenza Virus 2 NOT DETECTED NOT DETECTED Final   Parainfluenza Virus 3 NOT DETECTED NOT DETECTED Final   Parainfluenza Virus 4 NOT DETECTED NOT DETECTED Final   Respiratory Syncytial Virus NOT DETECTED NOT DETECTED Final   Bordetella pertussis NOT DETECTED NOT DETECTED Final   Bordetella Parapertussis NOT DETECTED NOT DETECTED Final   Chlamydophila pneumoniae NOT DETECTED NOT DETECTED Final   Mycoplasma pneumoniae NOT DETECTED NOT DETECTED Final    Comment: Performed at [facility], [address], [place], [place] [address]  Resp panel by RT-PCR (RSV, Flu A&B, Covid) Anterior Nasal Swab     Status: None   Collection Time: 04/04/24  9:49 PM   Specimen: Anterior Nasal Swab  Result Value Ref Range Status   SARS Coronavirus 2 by RT PCR NEGATIVE NEGATIVE Final    Comment: (NOTE) SARS-CoV-2 target nucleic acids are NOT DETECTED.  The SARS-CoV-2 RNA is generally detectable in upper respiratory specimens during the acute phase of infection. The lowest concentration of SARS-CoV-2 viral copies this assay can detect is 138 copies/mL. A negative result does not preclude SARS-Cov-2 infection and should not be used as the sole basis for treatment or other patient management decisions. A negative result may occur with  improper specimen collection/handling, submission of specimen other than nasopharyngeal swab, presence of viral mutation(s) within the areas targeted by this assay, and inadequate number of viral copies(<138 copies/mL). A  negative result must be combined with clinical observations, patient history, and epidemiological information. The expected result is Negative.  Fact Sheet for Patients:  BloggerCourse.com  Fact Sheet for Healthcare Providers:  SeriousBroker.it  This test is no t yet approved or cleared by the [place]  FDA and  has been authorized for detection and/or diagnosis of SARS-CoV-2 by FDA under an Emergency Use Authorization (EUA).  This EUA will remain  in effect (meaning this test can be used) for the duration of the COVID-19 declaration under Section 564(b)(1) of the Act, 21 U.S.C.section 360bbb-3(b)(1), unless the authorization is terminated  or revoked sooner.       Influenza A by PCR NEGATIVE NEGATIVE Final   Influenza B by PCR NEGATIVE NEGATIVE Final    Comment: (NOTE) The Xpert Xpress SARS-CoV-2/FLU/RSV plus assay is intended as an aid in the diagnosis of influenza from Nasopharyngeal swab specimens and should not be used as a sole basis for treatment. Nasal washings and aspirates are unacceptable for Xpert Xpress SARS-CoV-2/FLU/RSV testing.  Fact Sheet for Patients: BloggerCourse.com  Fact Sheet for Healthcare Providers: SeriousBroker.it  This test is not yet approved or cleared by the [place]  FDA and has been authorized for detection and/or diagnosis of SARS-CoV-2 by FDA under an Emergency Use Authorization (EUA). This EUA will remain in effect (meaning this test can be used) for the duration of the COVID-19 declaration under Section 564(b)(1) of the Act, 21 U.S.C. section 360bbb-3(b)(1), unless the authorization is terminated or revoked.     Resp Syncytial Virus by PCR NEGATIVE NEGATIVE Final    Comment: (NOTE) Fact Sheet for Patients: BloggerCourse.com  Fact Sheet for Healthcare  Providers: SeriousBroker.it  This test is not yet approved or cleared by the [place]  FDA and has been authorized for detection and/or diagnosis of SARS-CoV-2 by FDA under an Emergency Use Authorization (EUA). This EUA will remain in effect (meaning this test can be used) for the duration of the COVID-19 declaration under Section 564(b)(1) of the Act, 21 U.S.C. section 360bbb-3(b)(1), unless the authorization is terminated or revoked.  Performed at [facility], [address] [place], [place] [address]   Expectorated Sputum Assessment w Gram Stain, Rflx to Resp Cult     Status: None   Collection Time: 04/05/24  6:36 AM   Specimen: Sputum  Result Value Ref Range Status   Specimen Description SPUTUM  Final   Special Requests EXPSU  Final   Sputum evaluation   Final    Sputum specimen not acceptable for testing.  Please recollect.   C/MOLLY NEUDECKER AT 0939 04/05/24.PMF Performed at [facility], [address] [place], [place] [address]    Report Status 04/05/2024 FINAL  Final  Expectorated Sputum Assessment w Gram Stain, Rflx to Resp Cult     Status: None   Collection Time: 04/05/24  9:41 AM   Specimen: Sputum  Result Value Ref Range Status   Specimen Description SPUTUM  Final   Special Requests EXPSU  Final   Sputum evaluation   Final    THIS SPECIMEN IS ACCEPTABLE FOR SPUTUM CULTURE Performed at [facility], [address], [place], [place] [address]    Report Status 04/05/2024 FINAL  Final  Culture, Respiratory w Gram Stain     Status: None   Collection Time: 04/05/24  9:41 AM   Specimen: SPU  Result Value Ref Range Status   Specimen Description   Final    SPUTUM Performed at [facility], [address] [place], [place] [address]    Special Requests   Final    EXPSU Reflexed from [id number] Performed at [facility], [address] [place], [place] [address]    Gram Stain   Final     FEW WBC PRESENT,BOTH PMN AND MONONUCLEAR RARE GRAM POSITIVE COCCI RARE GRAM POSITIVE RODS RARE GRAM NEGATIVE RODS    Culture   Final  Normal respiratory flora-no Staph aureus or Pseudomonas seen Performed at [facility], [address] [place], [place] [address]    Report Status 04/07/2024 FINAL  Final     Labs: CBC: Recent Labs  Lab 04/04/24 1634 04/05/24 0526 04/06/24 0523 04/07/24 0507  WBC 8.8 7.4 8.0 5.9  NEUTROABS 6.6  --   --   --   HGB 12.2 12.4 12.4 10.5*  HCT 37.3 37.8 36.7 32.0*  MCV 95.9 95.5 93.9 96.4  PLT 232 241 270 253   Basic Metabolic Panel: Recent Labs  Lab 04/04/24 1634 04/05/24 0526 04/06/24 0523 04/07/24 0507  NA 140 141 144 143  K 3.8 3.7 4.1 3.6  CL 105 107 110 112*  CO2 26 24 23 24   GLUCOSE 113* 145* 120* 97  BUN 11 11 19 22   CREATININE 0.65 0.64 0.64 0.62  CALCIUM  8.9 8.7* 8.9 8.5*  MG  --  2.1 2.4 2.3  PHOS  --  3.5 3.4 3.2   Liver Function Tests: Recent Labs  Lab 04/04/24 1634  AST 29  ALT 19  ALKPHOS 55  BILITOT 1.7*  PROT 6.4*  ALBUMIN 3.5   No results for input(s): LIPASE, AMYLASE in the last 168 hours. No results for input(s): AMMONIA in the last 168 hours. Cardiac Enzymes: Recent Labs  Lab 04/05/24 0526 04/07/24 0507  CKTOTAL 323* 107   BNP (last 3 results) No results for input(s): BNP in the last 8760 hours. CBG: No results for input(s): GLUCAP in the last 168 hours.  Time spent: 35 minutes  Signed:  Elvan Sor  [facility] 04/07/2024 12:39 PM

## 2024-04-09 LAB — CULTURE, BLOOD (ROUTINE X 2)
Culture: NO GROWTH
Culture: NO GROWTH
Special Requests: ADEQUATE
Special Requests: ADEQUATE

## 2024-06-03 ENCOUNTER — Other Ambulatory Visit: Payer: Self-pay | Admitting: Internal Medicine

## 2024-06-03 DIAGNOSIS — Z1231 Encounter for screening mammogram for malignant neoplasm of breast: Secondary | ICD-10-CM

## 2024-08-21 ENCOUNTER — Telehealth: Payer: Self-pay

## 2024-08-21 NOTE — Telephone Encounter (Signed)
 Patient called regarding questions about when next appointment was.   Give patient information when next appointment and time was.

## 2024-09-01 ENCOUNTER — Ambulatory Visit: Payer: Medicare Other | Admitting: Dermatology

## 2024-09-03 ENCOUNTER — Encounter: Payer: Self-pay | Admitting: Dermatology

## 2024-09-03 ENCOUNTER — Ambulatory Visit: Payer: Medicare Other | Admitting: Dermatology

## 2024-09-03 DIAGNOSIS — L814 Other melanin hyperpigmentation: Secondary | ICD-10-CM

## 2024-09-03 DIAGNOSIS — L578 Other skin changes due to chronic exposure to nonionizing radiation: Secondary | ICD-10-CM

## 2024-09-03 DIAGNOSIS — D229 Melanocytic nevi, unspecified: Secondary | ICD-10-CM

## 2024-09-03 DIAGNOSIS — L570 Actinic keratosis: Secondary | ICD-10-CM

## 2024-09-03 DIAGNOSIS — D692 Other nonthrombocytopenic purpura: Secondary | ICD-10-CM

## 2024-09-03 DIAGNOSIS — Z8589 Personal history of malignant neoplasm of other organs and systems: Secondary | ICD-10-CM

## 2024-09-03 DIAGNOSIS — L821 Other seborrheic keratosis: Secondary | ICD-10-CM

## 2024-09-03 DIAGNOSIS — Z1283 Encounter for screening for malignant neoplasm of skin: Secondary | ICD-10-CM

## 2024-09-03 DIAGNOSIS — Z85828 Personal history of other malignant neoplasm of skin: Secondary | ICD-10-CM

## 2024-09-03 NOTE — Patient Instructions (Signed)

## 2024-09-03 NOTE — Progress Notes (Unsigned)
 Follow-Up Visit   Subjective  Abigail Powers is a 83 y.o. female who presents for the following: Skin Cancer Screening and Full Body Skin Exam  The patient presents for Total-Body Skin Exam (TBSE) for skin cancer screening and mole check. The patient has spots, moles and lesions to be evaluated, some may be new or changing and the patient may have concern these could be cancer.   The following portions of the chart were reviewed this encounter and updated as appropriate: medications, allergies, medical history  Review of Systems:  No other skin or systemic complaints except as noted in HPI or Assessment and Plan.  Objective  Well appearing patient in no apparent distress; mood and affect are within normal limits.  A full examination was performed including scalp, head, eyes, ears, nose, lips, neck, chest, axillae, abdomen, back, buttocks, bilateral upper extremities, bilateral lower extremities, hands, feet, fingers, toes, fingernails, and toenails. All findings within normal limits unless otherwise noted below.   Relevant physical exam findings are noted in the Assessment and Plan.  forehead x 1 Erythematous thin papules/macules with gritty scale.   Assessment & Plan   SKIN CANCER SCREENING PERFORMED TODAY.  ACTINIC DAMAGE - Chronic condition, secondary to cumulative UV/sun exposure - diffuse scaly erythematous macules with underlying dyspigmentation - Recommend daily broad spectrum sunscreen SPF 30+ to sun-exposed areas, reapply every 2 hours as needed.  - Staying in the shade or wearing long sleeves, sun glasses (UVA+UVB protection) and wide brim hats (4-inch brim around the entire circumference of the hat) are also recommended for sun protection.  - Call for new or changing lesions.  LENTIGINES, SEBORRHEIC KERATOSES, HEMANGIOMAS - Benign normal skin lesions - Benign-appearing - Call for any changes  MELANOCYTIC NEVI - Tan-brown and/or pink-flesh-colored symmetric  macules and papules - Benign appearing on exam today - Observation - Call clinic for new or changing moles - Recommend daily use of broad spectrum spf 30+ sunscreen to sun-exposed areas.   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  - Call if any new or changing lesions are noted between office visits  AK (ACTINIC KERATOSIS) forehead x 1 Actinic keratoses are precancerous spots that appear secondary to cumulative UV radiation exposure/sun exposure over time. They are chronic with expected duration over 1 year. A portion of actinic keratoses will progress to squamous cell carcinoma of the skin. It is not possible to reliably predict which spots will progress to skin cancer and so treatment is recommended to prevent development of skin cancer.  Recommend daily broad spectrum sunscreen SPF 30+ to sun-exposed areas, reapply every 2 hours as needed.  Recommend staying in the shade or wearing long sleeves, sun glasses (UVA+UVB protection) and wide brim hats (4-inch brim around the entire circumference of the hat). Call for new or changing lesions. Destruction of lesion - forehead x 1 Complexity: simple   Destruction method: cryotherapy   Informed consent: discussed and consent obtained   Timeout:  patient name, date of birth, surgical site, and procedure verified Lesion destroyed using liquid nitrogen: Yes   Region frozen until ice ball extended beyond lesion: Yes   Outcome: patient tolerated procedure well with no complications   Post-procedure details: wound care instructions given     Purpura - Chronic; persistent  and recurrent.  Treatable, but not curable. - Violaceous macules and patches - Benign - Related to trauma, age, sun damage and/or use of blood thinners,  chronic use of topical and/or oral steroids - Observe - Can use OTC arnica containing moisturizer such as Dermend Bruise Formula if desired - Call for worsening or other concerns  HISTORY OF SQUAMOUS CELL CARCINOMA OF THE SKIN - No evidence of recurrence today - No lymphadenopathy - Recommend regular full body skin exams - Recommend daily broad spectrum sunscreen SPF 30+ to sun-exposed areas, reapply every 2 hours as needed.  - Call if any new or changing lesions are noted between office visits  Return in about 1 year (around 09/03/2025) for TBSE - hx BCC, AK, SCC.  I, Rosina Mayans, CMA, am acting as scribe for Alm Rhyme, MD .   Documentation: I have reviewed the above documentation for accuracy and completeness, and I agree with the above.  Alm Rhyme, MD

## 2024-09-09 ENCOUNTER — Encounter: Payer: Self-pay | Admitting: Dermatology

## 2024-09-18 ENCOUNTER — Ambulatory Visit

## 2025-09-03 ENCOUNTER — Ambulatory Visit: Admitting: Dermatology
